# Patient Record
Sex: Male | Born: 1970 | Race: White | Hispanic: No | Marital: Married
Health system: Midwestern US, Community
[De-identification: ages and names within clinical notes are randomized; demographics above are authoritative.]

## PROBLEM LIST (undated history)

## (undated) ENCOUNTER — Ambulatory Visit

## (undated) DIAGNOSIS — R0602 Shortness of breath: Secondary | ICD-10-CM

## (undated) DIAGNOSIS — Z139 Encounter for screening, unspecified: Secondary | ICD-10-CM

## (undated) DIAGNOSIS — M25819 Other specified joint disorders, unspecified shoulder: Secondary | ICD-10-CM

## (undated) DIAGNOSIS — K219 Gastro-esophageal reflux disease without esophagitis: Principal | ICD-10-CM

## (undated) DIAGNOSIS — D7219 Other eosinophilia: Principal | ICD-10-CM

## (undated) DIAGNOSIS — I2694 Multiple subsegmental pulmonary emboli without acute cor pulmonale: Principal | ICD-10-CM

## (undated) DIAGNOSIS — I2699 Other pulmonary embolism without acute cor pulmonale: Principal | ICD-10-CM

## (undated) DIAGNOSIS — R936 Abnormal findings on diagnostic imaging of limbs: Secondary | ICD-10-CM

## (undated) DIAGNOSIS — I2693 Single subsegmental pulmonary embolism without acute cor pulmonale: Principal | ICD-10-CM

## (undated) DIAGNOSIS — J4540 Moderate persistent asthma, uncomplicated: Secondary | ICD-10-CM

## (undated) DIAGNOSIS — Z125 Encounter for screening for malignant neoplasm of prostate: Secondary | ICD-10-CM

## (undated) DIAGNOSIS — J449 Chronic obstructive pulmonary disease, unspecified: Principal | ICD-10-CM

## (undated) DIAGNOSIS — F17210 Nicotine dependence, cigarettes, uncomplicated: Secondary | ICD-10-CM

## (undated) DIAGNOSIS — W57XXXA Bitten or stung by nonvenomous insect and other nonvenomous arthropods, initial encounter: Secondary | ICD-10-CM

## (undated) DIAGNOSIS — Z79899 Other long term (current) drug therapy: Principal | ICD-10-CM

## (undated) DIAGNOSIS — S30861A Insect bite (nonvenomous) of abdominal wall, initial encounter: Secondary | ICD-10-CM

## (undated) DIAGNOSIS — C3490 Malignant neoplasm of unspecified part of unspecified bronchus or lung: Secondary | ICD-10-CM

## (undated) DIAGNOSIS — I639 Cerebral infarction, unspecified: Secondary | ICD-10-CM

## (undated) DIAGNOSIS — K760 Fatty (change of) liver, not elsewhere classified: Secondary | ICD-10-CM

## (undated) DIAGNOSIS — I700 Atherosclerosis of aorta: Secondary | ICD-10-CM

## (undated) DIAGNOSIS — J439 Emphysema, unspecified: Secondary | ICD-10-CM

## (undated) DIAGNOSIS — C6290 Malignant neoplasm of unspecified testis, unspecified whether descended or undescended: Secondary | ICD-10-CM

## (undated) HISTORY — DX: Fatty (change of) liver, not elsewhere classified: K76.0

## (undated) HISTORY — DX: Emphysema, unspecified: J43.9

## (undated) HISTORY — DX: Atherosclerosis of aorta: I70.0

## (undated) HISTORY — PX: CARDIAC CATHETERIZATION: SHX172

## (undated) HISTORY — PX: TESTICLE SURGERY: SHX794

---

## 1998-07-27 ENCOUNTER — Emergency Department (HOSPITAL_COMMUNITY): Admission: EM | Admit: 1998-07-27 | Discharge: 1998-07-27 | Payer: Self-pay | Admitting: Emergency Medicine

## 1998-12-29 ENCOUNTER — Encounter: Payer: Self-pay | Admitting: Emergency Medicine

## 1998-12-29 ENCOUNTER — Emergency Department (HOSPITAL_COMMUNITY): Admission: EM | Admit: 1998-12-29 | Discharge: 1998-12-29 | Payer: Self-pay | Admitting: Emergency Medicine

## 1999-02-17 ENCOUNTER — Emergency Department (HOSPITAL_COMMUNITY): Admission: EM | Admit: 1999-02-17 | Discharge: 1999-02-17 | Payer: Self-pay | Admitting: Internal Medicine

## 2001-07-29 ENCOUNTER — Encounter: Payer: Self-pay | Admitting: Urology

## 2001-07-29 ENCOUNTER — Ambulatory Visit (HOSPITAL_COMMUNITY): Admission: RE | Admit: 2001-07-29 | Discharge: 2001-07-29 | Payer: Self-pay | Admitting: Urology

## 2001-08-01 ENCOUNTER — Ambulatory Visit (HOSPITAL_COMMUNITY): Admission: RE | Admit: 2001-08-01 | Discharge: 2001-08-01 | Payer: Self-pay | Admitting: Urology

## 2001-08-19 ENCOUNTER — Ambulatory Visit: Admission: RE | Admit: 2001-08-19 | Discharge: 2001-11-17 | Payer: Self-pay | Admitting: Radiation Oncology

## 2002-06-29 ENCOUNTER — Ambulatory Visit: Admission: RE | Admit: 2002-06-29 | Discharge: 2002-06-29 | Payer: Self-pay | Admitting: Radiation Oncology

## 2002-06-29 ENCOUNTER — Ambulatory Visit (HOSPITAL_COMMUNITY): Admission: RE | Admit: 2002-06-29 | Discharge: 2002-06-29 | Payer: Self-pay | Admitting: Radiation Oncology

## 2002-07-11 ENCOUNTER — Ambulatory Visit (HOSPITAL_COMMUNITY): Admission: RE | Admit: 2002-07-11 | Discharge: 2002-07-11 | Payer: Self-pay | Admitting: Radiation Oncology

## 2002-08-10 ENCOUNTER — Ambulatory Visit: Admission: RE | Admit: 2002-08-10 | Discharge: 2002-08-10 | Payer: Self-pay | Admitting: Radiation Oncology

## 2002-08-17 ENCOUNTER — Ambulatory Visit: Admission: RE | Admit: 2002-08-17 | Discharge: 2002-08-17 | Payer: Self-pay | Admitting: Radiation Oncology

## 2003-09-27 ENCOUNTER — Emergency Department (HOSPITAL_COMMUNITY): Admission: EM | Admit: 2003-09-27 | Discharge: 2003-09-27 | Payer: Self-pay | Admitting: Emergency Medicine

## 2012-11-24 ENCOUNTER — Emergency Department (HOSPITAL_COMMUNITY)
Admission: EM | Admit: 2012-11-24 | Discharge: 2012-11-24 | Disposition: A | Discharge status: Home / Self Care | Payer: BC Managed Care – PPO | Source: Home / Self Care

## 2012-11-24 ENCOUNTER — Encounter (HOSPITAL_COMMUNITY): Payer: Self-pay | Admitting: *Deleted

## 2012-11-24 DIAGNOSIS — J039 Acute tonsillitis, unspecified: Secondary | ICD-10-CM

## 2012-11-24 HISTORY — DX: Malignant neoplasm of unspecified testis, unspecified whether descended or undescended: C62.90

## 2012-11-24 MED ORDER — ACETAMINOPHEN-CODEINE 120-12 MG/5ML PO SOLN: 5.0000 mL | Freq: Four times a day (QID) | ORAL | Status: DC | PRN

## 2012-11-24 MED ORDER — IBUPROFEN 600 MG PO TABS: 600.0000 mg | ORAL_TABLET | Freq: Three times a day (TID) | ORAL | Status: DC | PRN

## 2012-11-24 MED ORDER — PREDNISONE 20 MG PO TABS: ORAL_TABLET | ORAL | Status: DC

## 2012-11-24 MED ORDER — AMOXICILLIN 500 MG PO CAPS: 500.0000 mg | ORAL_CAPSULE | Freq: Three times a day (TID) | ORAL | Status: DC

## 2012-11-24 NOTE — ED Notes (Signed)
Pt  Reports  Symptoms  Of  sorethroat  With  Swollen lymph  Nodes  For  sev  Days

## 2012-11-24 NOTE — ED Provider Notes (Signed)
History     CSN: [id number]  Arrival date & time 11/24/12  1030   None     No chief complaint on file.   (Consider location/radiation/quality/duration/timing/severity/associated sxs/prior treatment) HPI Comments: 42 year old male smoker here complaining of sore throat for one week. Patient reports painful swallowing and headache associated with sore throat. Taking frequently aspirin for pain. Symptoms not improving. Denies fever. Reports decreased appetite and intermittent nausea. Denies abdominal pain, vomiting or diarrhea. No rashes.   Past Medical History  Diagnosis Date  . Testicle cancer     Past Surgical History  Procedure Laterality Date  . Testicle surgery      No family history on file.  History  Substance Use Topics  . Smoking status: Current Every Day Smoker  . Smokeless tobacco: Not on file  . Alcohol Use: Yes      Review of Systems  Constitutional: Positive for appetite change. Negative for fever, chills and fatigue.  HENT: Positive for sore throat and trouble swallowing. Negative for congestion and sinus pressure.   Respiratory: Negative for cough, shortness of breath and wheezing.   Cardiovascular: Negative for chest pain.  Gastrointestinal: Positive for nausea. Negative for vomiting, abdominal pain and diarrhea.  Musculoskeletal: Negative for arthralgias.  Skin: Negative for rash.  Neurological: Positive for headaches. Negative for dizziness.    Allergies  Review of patient's allergies indicates no known allergies.  Home Medications   Current Outpatient Rx  Name  Route  Sig  Dispense  Refill  . sildenafil (VIAGRA) 100 MG tablet   Oral   Take 100 mg by mouth daily as needed for erectile dysfunction.         Marland Kitchen acetaminophen-codeine 120-12 MG/5ML solution   Oral   Take 5 mLs by mouth every 6 (six) hours as needed for pain.   120 mL   0   . amoxicillin (AMOXIL) 500 MG capsule   Oral   Take 1 capsule (500 mg total) by mouth 3 (three)  times daily.   30 capsule   0   . ibuprofen (ADVIL,MOTRIN) 600 MG tablet   Oral   Take 1 tablet (600 mg total) by mouth every 8 (eight) hours as needed for pain or fever.   20 tablet   0   . predniSONE (DELTASONE) 20 MG tablet      2 tabs by mouth daily for 3 days   6 tablet   0     BP 118/72  Pulse 72  Temp(Src) 98.6 F (37 C) (Oral)  Resp 16  SpO2 100%  Physical Exam  Nursing note and vitals reviewed. Constitutional: He is oriented to person, place, and time. He appears well-developed and well-nourished. No distress.  HENT:  Head: Normocephalic and atraumatic.  Left Ear: External ear normal.  Right external ear canal with wax plug. Nose normal. Significant pharyngeal erythema left tonsillar swelling with white exudates. No uvula deviation. No trismus. TM right:Right external ear canal with wax plug. TM not observed. Left TM normal.  Eyes: Conjunctivae and EOM are normal. Pupils are equal, round, and reactive to light. No scleral icterus.  Neck: Neck supple.   Enlarged tender left submandibular lymphadenopathy  Cardiovascular: Normal rate, regular rhythm and normal heart sounds.   No murmur heard. Pulmonary/Chest: Effort normal and breath sounds normal. No respiratory distress. He has no wheezes. He has no rales. He exhibits no tenderness.  Abdominal: Soft. There is no tenderness.  No hepatosplenomegaly  Neurological: He is alert and oriented to person,  place, and time.  Skin: No rash noted. He is not diaphoretic.    ED Course  Procedures (including critical care time)  Labs Reviewed - No data to display No results found.   1. Tonsillitis       MDM  Exudative left tonsil with enlarged tender left lymphadenopathy. Prescribed amoxicillin, ibuprofen, prednisone, acetaminophen/codeine. Supportive care and red flags that should prompt his return to medical attention discussed with patient and provided in writing        Sharin Grave, MD 11/24/12  1137

## 2013-01-03 ENCOUNTER — Telehealth: Payer: Self-pay | Admitting: *Deleted

## 2013-01-03 NOTE — Telephone Encounter (Signed)
On 01-03-13 fax the last office note to [facility] physicians at [place].

## 2013-10-05 LAB — URINALYSIS
Bilirubin Urine: NEGATIVE
Glucose, Ur: NEGATIVE MG/DL
Ketones, Urine: NEGATIVE MG/DL
Leukocyte Esterase, Urine: NEGATIVE
Nitrite, Urine: NEGATIVE
Occult Blood,Urine: NEGATIVE
Protein, UA: NEGATIVE MG/DL
Specific Gravity, Urine: 1.027 (ref 1.001–1.035)
Urobilinogen, Urine: 0.2 EU/DL
pH, UA: 6

## 2014-07-13 ENCOUNTER — Emergency Department: Payer: Self-pay | Admitting: Emergency Medicine

## 2016-03-11 ENCOUNTER — Emergency Department
Admission: EM | Admit: 2016-03-11 | Discharge: 2016-03-11 | Disposition: A | Discharge status: Home / Self Care | Payer: Self-pay | Attending: Student | Admitting: Student

## 2016-03-11 DIAGNOSIS — Z8547 Personal history of malignant neoplasm of testis: Secondary | ICD-10-CM | POA: Insufficient documentation

## 2016-03-11 DIAGNOSIS — F17200 Nicotine dependence, unspecified, uncomplicated: Secondary | ICD-10-CM | POA: Insufficient documentation

## 2016-03-11 DIAGNOSIS — K610 Anal abscess: Secondary | ICD-10-CM

## 2016-03-11 DIAGNOSIS — K611 Rectal abscess: Secondary | ICD-10-CM | POA: Insufficient documentation

## 2016-03-11 MED ORDER — OXYCODONE-ACETAMINOPHEN 5-325 MG PO TABS: 1.0000 | ORAL_TABLET | ORAL | 0 refills | Status: DC | PRN

## 2016-03-11 MED ORDER — SULFAMETHOXAZOLE-TRIMETHOPRIM 800-160 MG PO TABS: 1.0000 | ORAL_TABLET | Freq: Two times a day (BID) | ORAL | 0 refills | Status: DC

## 2016-03-11 NOTE — Discharge Instructions (Signed)
Begin taking medication today. Did not take pain medication while driving or operating machinery. Take antibiotics twice day for 10 days. Do sitz baths 2-3 times per day or warm moist compresses frequently. Follow-up with Dr. Pat Patrick who is the surgeon on call. He will need to call his office and make an appointment. Also return to the emergency room over the weekend if any worsening of your symptoms.

## 2016-03-11 NOTE — ED Triage Notes (Signed)
Pt c/o swelling around anal area for the past 3 days.Marland Kitchen

## 2016-03-11 NOTE — ED Notes (Signed)
See triage note   States he has had peri rectal abscess in past  Developed pain with some swelling about 1 week ago  Also noticed some blood on paper when using the bathroom

## 2016-03-11 NOTE — ED Provider Notes (Signed)
[facility] Emergency Department Provider Note  ____________________________________________   First MD Initiated Contact with Patient 03/11/16 0848     (approximate)  I have reviewed the triage vital signs and the nursing notes.   HISTORY  Chief Complaint Abscess   HPI Jacob [name] is a 45 y.o. male is here with complaint of swelling in the rectal area for the last 3 days. Patient states that he had similar episode approximately 10 years ago at which time it was an abscess and it had to be opened and drained. He has had no problems since that time. Prior to arrival in the emergency room patient has not done any sitz baths or warm compresses to the area. He denies any fever or chills, no nausea or vomiting. He rates his pain as a 3 out of 10.   Past Medical History:  Diagnosis Date  . 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[place])     There are no active problems to display for this patient.   Past Surgical History:  Procedure Laterality Date  . TESTICLE SURGERY      Prior to Admission medications   Medication Sig Start Date End Date Taking? Authorizing Provider  oxyCODONE-acetaminophen (PERCOCET) 5-325 MG tablet Take 1-2 tablets by mouth every 4 (four) hours as needed for severe pain. 03/11/16   Johnn Hai, PA-C  sulfamethoxazole-trimethoprim (BACTRIM DS,SEPTRA DS) 800-160 MG tablet Take 1 tablet by mouth 2 (two) times daily. 03/11/16   Johnn Hai, PA-C    Allergies Review of patient's allergies indicates no known allergies.  No family history on file.  Social History Social History  Substance Use Topics  . Smoking status: Current Every Day Smoker  . Smokeless tobacco: Never Used  . Alcohol use No    Review of Systems Constitutional: No fever/chills Cardiovascular: Denies chest pain. Respiratory: Denies shortness of breath. Gastrointestinal: No abdominal pain.  No nausea, no vomiting.   Musculoskeletal: Negative for back pain. Skin:  Negative for rash.  Positive abscess. Neurological: Negative for headaches, focal weakness or numbness.  10-point ROS otherwise negative.  ____________________________________________   PHYSICAL EXAM:  VITAL SIGNS: ED Triage Vitals  Enc Vitals Group     BP 03/11/16 0839 (!) 157/87     Pulse Rate 03/11/16 0839 97     Resp 03/11/16 0839 18     Temp 03/11/16 0839 98.2 F (36.8 C)     Temp Source 03/11/16 0839 Oral     SpO2 03/11/16 0839 98 %     Weight 03/11/16 0840 170 lb (77.1 kg)     Height 03/11/16 0840 '5\' 8"'$  (1.727 m)     Head Circumference --      Peak Flow --      Pain Score 03/11/16 0840 3     Pain Loc --      Pain Edu? --      Excl. in [place]? --     Constitutional: Alert and oriented. Well appearing and in no acute distress. Eyes: Conjunctivae are normal. PERRL. EOMI. Head: Atraumatic. Nose: No congestion/rhinnorhea. Neck: No stridor.   Cardiovascular: Normal rate, regular rhythm. Grossly normal heart sounds.  Good peripheral circulation. Respiratory: Normal respiratory effort.  No retractions. Lungs CTAB. Musculoskeletal: Moves upper and lower extremities without any difficulty. Normal gait was noted. Neurologic:  Normal speech and language. No gross focal neurologic deficits are appreciated. No gait instability. Skin:  Skin is warm, dry.  Firm, tender, Nonfluctuant erythematous nodule measuring approximately 2 cm perirectal area. No  drainage and no extending cellulitis. Psychiatric: Mood and affect are normal. Speech and behavior are normal.  ____________________________________________   LABS (all labs ordered are listed, but only abnormal results are displayed)  Labs Reviewed - No data to display   PROCEDURES  Procedure(s) performed: None  Procedures  Critical Care performed: No  ____________________________________________   INITIAL IMPRESSION / ASSESSMENT AND PLAN / ED COURSE  Pertinent labs & imaging results that were available during my care  of the patient were reviewed by me and considered in my medical decision making (see chart for details).    Clinical Course   She is given prescription for Percocet one or 2 every 4 hours as needed for pain. He is also given a prescription for Bactrim DS twice a day for 10 days. Patient is encouraged to do sitz baths and warm compresses frequently for the next 1-2 days and to return to the emergency room for incision and drainage. He was also given information about the surgeon who is on call to see in the office if possible. Patient will return to the emergency room if any nausea, vomiting or fever.  ____________________________________________   FINAL CLINICAL IMPRESSION(S) / ED DIAGNOSES  Final diagnoses:  Perianal abscess      NEW MEDICATIONS STARTED DURING THIS VISIT:  Discharge Medication List as of 03/11/2016  9:01 AM    START taking these medications   Details  oxyCODONE-acetaminophen (PERCOCET) 5-325 MG tablet Take 1-2 tablets by mouth every 4 (four) hours as needed for severe pain., Starting Wed 03/11/2016, Print    sulfamethoxazole-trimethoprim (BACTRIM DS,SEPTRA DS) 800-160 MG tablet Take 1 tablet by mouth 2 (two) times daily., Starting Wed 03/11/2016, Print         Note:  This document was prepared using Dragon voice recognition software and may include unintentional dictation errors.    Johnn Hai, PA-C 03/11/16 [place] Lyndall Bellot, PA-C 03/21/16 1814    Joanne Gavel, MD 03/23/16 [phone]

## 2017-09-21 NOTE — ED Provider Notes (Signed)
History        Chief Complaint   Patient presents with   . Flank Pain       47 year old male presents with left flank pain x2 weeks.  Describes aching/cramping pain in left flank without radiation, no aggravating or alleviating factors.  Pain is intermittent but worsening.  No known injury or trauma.  Was seen by PCP x2 over the past two weeks, treated with NSAIDs, muscle relaxers, Medrol Dosepack, and tramadol without improvement in symptoms.  Had acute worsening today and was told to come to the ED for further evaluation.  Scheduled for outpatient MRI by PCP next week if not improving.  Denies saddle anesthesia, loss of bowel or bladder control, or leg weakness/numbness.  Denies fevers or symptoms of systemic illness.  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      The history is provided by the patient.   Flank Pain   Severity:  Moderate  Onset quality:  Gradual  Duration:  2 weeks  Timing:  Intermittent  Progression:  Worsening  Chronicity:  New  Relieved by:  Nothing  Worsened by:  Nothing  Ineffective treatments:  See HPI  Associated symptoms: no abdominal pain, no chest pain, no cough, no fever, no nausea, no rash, no shortness of breath and no vomiting        History reviewed. No pertinent past medical history.    Past Surgical History:   Procedure Laterality Date   . NEPHRECTOMY TRANSPLANTED ORGAN Left        History reviewed. No pertinent family history.    Social History     Tobacco Use   . Smoking status: Current Every Day Smoker   . Smokeless tobacco: Never Used   Substance and Sexual Activity   . Alcohol use: No     Frequency: Never   . Drug use: No   . Sexual activity: Defer       Prior to Admission medications    Medication Sig Start Date End Date Taking? Authorizing Provider   ranitidine (ZANTAC) 150 MG tablet Take 150 mg by mouth 2 (two) times a day   Yes Historical Provider, MD       Review of Systems   Constitutional: Negative for fever.   Respiratory: Negative for cough and shortness of  breath.    Cardiovascular: Negative for chest pain.   Gastrointestinal: Negative for abdominal pain, nausea and vomiting.   Genitourinary: Positive for flank pain.   Musculoskeletal: Positive for back pain.   Skin: Negative for rash.   All other systems reviewed and are negative.        Physical Exam   Initial Vitals:  ED Triage Vitals [09/21/17 1047]  BP: 116/73  Pulse: 68  Resp: 16  Temp: 98 F (36.7 C)  Temp src: Oral  SpO2: 98 %    Vitals:  BP 116/73   Pulse 68   Temp 98 F (36.7 C) (Oral)   Resp 16   Ht 6\' 1"  (1.854 m)   Wt 93 kg (205 lb)   SpO2 98%   BMI 27.05 kg/m     Physical Exam   Constitutional: He is oriented to person, place, and time. He appears well-developed and well-nourished. No distress.   HENT:   Head: Normocephalic and atraumatic.   Cardiovascular: Normal rate and regular rhythm.   Pulmonary/Chest: Effort normal and breath sounds normal. No respiratory distress.   Abdominal: Soft. There is no tenderness.   Musculoskeletal: Normal range of motion.  TTP over left flank without midline tenderness.  No overlying erythema, ecchymosis, or crepitus.   Neurological: He is alert and oriented to person, place, and time.   Skin: Skin is warm and dry.   Nursing note and vitals reviewed.      ED Course         Procedures        MDM  Number of Diagnoses or Management Options  Acute left-sided low back pain without sciatica:      Amount and/or Complexity of Data Reviewed  Clinical lab tests: reviewed and ordered  Tests in the radiology section of CPT: ordered and reviewed        - labs reviewed, no significant findings  - CTAP with no acute findings  - will treat for MSK pain with naproxen, Robaxin, and small amount of Norco; d/c with PCP f/u  - PMP reviewed, only recent narcotic Rx was #7 tramadol  - discussed results, treatment plan, and return precautions with patient, all questions answered, patient voiced understanding and agreement with plan         ED Orders (From admission, onward)     Ordered     Status Ordering Provider    09/21/17 1211  CT Abdomen Pelvis Without Contrast  1 time imaging      Final result DUNCAN, MELISSA H    09/21/17 1211  sodium chloride (NS) 0.9 % bolus 1,000 mL  Once     Comments:  Wide Open Rate    Last MAR action:  Stopped Para March, MELISSA H    09/21/17 1128  Extra Tube Red Top  PROCEDURE ONCE      Final result DUNCAN, MELISSA H    09/21/17 1128  Extra Tube Pink Top  PROCEDURE ONCE      In process DUNCAN, MELISSA H    09/21/17 1128  Extra Tube Light Blue Top  PROCEDURE ONCE      In process DUNCAN, MELISSA H    09/21/17 1128  Extra Tube SST  PROCEDURE ONCE      In process DUNCAN, MELISSA H    09/21/17 1128  Rainbow draw  STAT      In process DUNCAN, MELISSA H    09/21/17 1110  CBC with Differential  STAT      Final result HAMBY, DAVID E    09/21/17 1110  Comprehensive Metabolic Panel  STAT      Final result HAMBY, DAVID E    09/21/17 1110  UA with Microscopic and Culture if Positive  STAT      Final result HAMBY, DAVID E            Lab Results   CBC WITH DIFFERENTIAL - Abnormal       Result Value    WBC 7.2      RBC 5.49      Hemoglobin 16.7      Hematocrit 49.5      MCV 90.3      MCH 30.4      MCHC 33.7      RDW CV 14.0      Platelet 296      MPV 7.4      Absolute Neutrophil 4.3      Absolute Lymphocyte 2.0      Absolute Monocyte 0.70 (*)     Absolute Eosinophil 0.20      Absolute Basophil 0.10      Neutrophil percent 59.2      Lymphocyte percent 27.9  Monocyte percent 9.6 (*)     Eosinophil percent 2.4      Basophil percent 0.9     COMPREHENSIVE METABOLIC PANEL - Abnormal    Sodium 139      Potassium 4.1      Chloride 103      Carbon dioxide 26      Anion gap 10      Glucose 105      BUN 16      Creatinine 1.20      BUN/Creatinine Ratio 13.3      Calcium 8.9      Protein total 7.0      Albumin 4.6      Bilirubin Total 0.3      AST 18      ALT 17 (*)     ALP 57      eGFR non-African American >60.0      eGFR African American >60.0     UA W MICROSCOPIC AND CULTURE IF POS     Color, UA Yellow      Appearance, UA Clear      Glucose, UA Negative      Bilirubin, UA Negative      Ketones, UA Negative      Specific gravity, UA 1.015      Blood, UA Negative      pH, UA 6.0      Protein, UA Negative      Urobilinogen, UA 0.2       Nitrite, UA Negative      Leukocytes, UA Negative      WBC, UA 1-2      RBC, UA 1-2     EXTRA TUBE RED TOP    Narrative:     Extra tube collected.   RAINBOW DRAW    Narrative:     The following orders were created for panel order Rainbow draw.  Procedure                               Abnormality         Status                     ---------                               -----------         ------                     Extra Terrence Dupont NGE[[id number]]                        In process                 Extra Tube MWN[[id number]]                                   In process                 Extra Tube Red QIH[[id number]]                               Final result  Extra Tube Pink [id number]                              In process                   Please view results for these tests on the individual orders.   EXTRA TUBE LIGHT BLUE TOP   EXTRA TUBE SST   EXTRA TUBE PINK TOP       CT Abdomen Pelvis Without Contrast   Final Result   REFERRED BY:  Cecille Po      DICTATED BY:  Dr. Jamse Belfast      PROCEDURE:  CT ABDOMEN PELVIS WO CONTRAST   Order Date:  09/21/2017      REASON FOR EXAM:  left flank pain      FINDINGS:  Axial images are obtained through the abdomen and pelvis   without IV contrast.  The liver demonstrates a small cyst in the   right lobe measuring 1.5 cm.  The right kidney demonstrates no   evidence of a stone.  Left kidney has been removed.  Aorta is normal   caliber.  Pancreas and spleen appear unremarkable.  No adrenal nodule   is identified.  The appendix is unremarkable.  The bladder is   unremarkable.  No pelvic fluid collection is seen.  There are   scattered diverticula sigmoid colon.  The lung bases demonstrate a   tiny nodule  in the right middle lobe measuring 3 mm.  There is a tiny   nodule in the right lower lobe measuring 2 mm.         IMPRESSION:  Previous left nephrectomy   Tiny benign-appearing nodules noted in the right lung most likely   granulomas.  Follow-up recommended to evaluate for stability   There are few scattered diverticula in the left and sigmoid colon.              ED Medication Administration from 09/21/2017 1042 to 09/21/2017 1858       Date/Time Order Dose Route Action Action by     09/21/2017 1449 sodium chloride (NS) 0.9 % bolus 1,000 mL 0 mL Intravenous Stopped Serita Kyle, RN     09/21/2017 1224 sodium chloride (NS) 0.9 % bolus 1,000 mL 1,000 mL Intravenous New Bag Serita Kyle, RN            Follow-up Provider     Follow up With Specialties Details Why Contact Info    Primary Care Provider         Last Edited:  Tue Sep 21, 2017  2:24 PM by Cecille Po, MD         ED Prescriptions (From admission, onward)    Start Ordered     Status Ordering Provider    09/21/17 0000 09/21/17 1430  HYDROcodone-acetaminophen (NORCO) 5-325 mg tablet  Every 6 hours PRN      Ordered DUNCAN, MELISSA H    09/21/17 0000 09/21/17 1430  methocarbamol (ROBAXIN) 750 MG tablet  Every 8 hours PRN      Ordered DUNCAN, MELISSA H    09/21/17 0000 09/21/17 1430  naproxen (NAPROSYN) 500 MG tablet  2 times daily PRN      Ordered DUNCAN, MELISSA H                Discharge Instructions       You were seen in the ED for  back pain.  Your workup did not show an emergent cause for your symptoms that requires hospitalization.  Your symptoms are likely due to a pinched nerve in your spine.  Please refer to the attached handout for more information.    Take medications as directed.  Follow up with PCP to discuss the need for an MRI for further evaluation.  Return to ED if your symptoms worsen, or if you develop any new and concerning symptoms.    The examination and treatment you have received in the Emergency Department today have been rendered on  an emergency basis only and are not intended to be a substitute for an effort to provide complete medical care.  You should contact your follow-up physician as it is important that you let him or her check you and report any new or remaining problems since it is impossible to recognize and treat all elements of an injury or illness in a single emergency care center visit.            Clinical Impression  Final diagnoses:   [M54.5] Acute left-sided low back pain without sciatica       Critical Care Time (excluding time for other separate services)           Cecille Po, MD  Electronically Signed  09/21/17 1858

## 2018-08-30 ENCOUNTER — Emergency Department (HOSPITAL_COMMUNITY): Payer: Self-pay

## 2018-08-30 ENCOUNTER — Encounter (HOSPITAL_COMMUNITY): Payer: Self-pay

## 2018-08-30 ENCOUNTER — Emergency Department (HOSPITAL_COMMUNITY)
Admission: EM | Admit: 2018-08-30 | Discharge: 2018-08-30 | Disposition: A | Discharge status: Home / Self Care | Payer: Self-pay | Attending: Emergency Medicine | Admitting: Emergency Medicine

## 2018-08-30 DIAGNOSIS — J189 Pneumonia, unspecified organism: Secondary | ICD-10-CM | POA: Insufficient documentation

## 2018-08-30 DIAGNOSIS — F17200 Nicotine dependence, unspecified, uncomplicated: Secondary | ICD-10-CM | POA: Insufficient documentation

## 2018-08-30 DIAGNOSIS — Z8547 Personal history of malignant neoplasm of testis: Secondary | ICD-10-CM | POA: Insufficient documentation

## 2018-08-30 LAB — BASIC METABOLIC PANEL
ANION GAP: 8 (ref 5–15)
BUN: 14 mg/dL (ref 6–20)
CALCIUM: 9.4 mg/dL (ref 8.9–10.3)
CO2: 24 mmol/L (ref 22–32)
Chloride: 105 mmol/L (ref 98–111)
Creatinine, Ser: 0.69 mg/dL (ref 0.61–1.24)
GFR calc Af Amer: >60 mL/min (ref >60)
GFR calc non Af Amer: >60 mL/min (ref >60)
Glucose, Bld: 102 mg/dL — ABNORMAL HIGH (ref 70–99)
Potassium: 3.9 mmol/L (ref 3.5–5.1)
Sodium: 137 mmol/L (ref 135–145)

## 2018-08-30 LAB — CBC
HCT: 46.4 % (ref 39.0–52.0)
HEMOGLOBIN: 14.9 g/dL (ref 13.0–17.0)
MCH: 29.7 pg (ref 26.0–34.0)
MCHC: 32.1 g/dL (ref 30.0–36.0)
MCV: 92.4 fL (ref 80.0–100.0)
PLATELETS: 298 10*3/uL (ref 150–400)
RBC: 5.02 MIL/uL (ref 4.22–5.81)
RDW: 13.8 % (ref 11.5–15.5)
WBC: 15.1 10*3/uL — ABNORMAL HIGH (ref 4.0–10.5)
nRBC: 0 % (ref 0.0–0.2)

## 2018-08-30 LAB — POCT I-STAT TROPONIN I: Troponin i, poc: 0 ng/mL (ref 0.00–0.08)

## 2018-08-30 MED ORDER — DOXYCYCLINE HYCLATE 100 MG PO CAPS: 100.0000 mg | ORAL_CAPSULE | Freq: Two times a day (BID) | ORAL | 0 refills | Status: AC

## 2018-08-30 MED ORDER — ALBUTEROL SULFATE (2.5 MG/3ML) 0.083% IN NEBU
5.0000 mg | INHALATION_SOLUTION | Freq: Once | RESPIRATORY_TRACT | Status: AC
  Administered 2018-08-30: 5 mg via RESPIRATORY_TRACT
  Filled 2018-08-30: qty 6

## 2018-08-30 NOTE — ED Triage Notes (Signed)
Pt stating he has had shortness of breath since Sunday night, causing chest pain. States it felt like he pulled a muscle but it continues to get worse. Pt states last week he had a productive cough but now feels like he cant get anything up.

## 2018-08-30 NOTE — ED Provider Notes (Signed)
[facility] DEPT Provider Note   CSN: [id number] Arrival date & time: 08/30/18  1937     History   Chief Complaint Chief Complaint  Patient presents with  . Chest Pain  . Shortness of Breath    HPI Jacob [name] is a 48 y.o. male.  The history is provided by the patient.  Cough  Cough characteristics:  Productive Sputum characteristics:  Nondescript Severity:  Mild Onset quality:  Gradual Timing:  Constant Progression:  Unchanged Chronicity:  New Context: upper respiratory infection   Relieved by:  Nothing Worsened by:  Nothing Associated symptoms: chest pain, chills, fever, myalgias, shortness of breath and sinus congestion   Associated symptoms: no ear pain, no rash, no sore throat and no wheezing   Risk factors: recent infection     Past Medical History:  Diagnosis Date  . 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   Past Surgical History:  Procedure Laterality Date  . TESTICLE SURGERY          Home Medications    Prior to Admission medications   Medication Sig Start Date End Date Taking? Authorizing Provider  doxycycline (VIBRAMYCIN) 100 MG capsule Take 1 capsule (100 mg total) by mouth 2 (two) times daily for 10 days. 08/30/18 09/09/18  Echo Allsbrook, DO  oxyCODONE-acetaminophen (PERCOCET) 5-325 MG tablet Take 1-2 tablets by mouth every 4 (four) hours as needed for severe pain. Patient not taking: Reported on 08/30/2018 03/11/16   Johnn Hai, PA-C  sulfamethoxazole-trimethoprim (BACTRIM DS,SEPTRA DS) 800-160 MG tablet Take 1 tablet by mouth 2 (two) times daily. Patient not taking: Reported on 08/30/2018 03/11/16   Johnn Hai, PA-C    Family History No family history on file.  Social History Social History   Tobacco Use  . Smoking status: Current Every Day Smoker  . Smokeless tobacco: Never Used  Substance Use Topics  . Alcohol use: No  . Drug use: Not on  file     Allergies   Patient has no known allergies.   Review of Systems Review of Systems  Constitutional: Positive for chills and fever.  HENT: Negative for ear pain and sore throat.   Eyes: Negative for pain and visual disturbance.  Respiratory: Positive for cough and shortness of breath. Negative for wheezing.   Cardiovascular: Positive for chest pain. Negative for palpitations.  Gastrointestinal: Negative for abdominal pain and vomiting.  Genitourinary: Negative for dysuria and hematuria.  Musculoskeletal: Positive for myalgias. Negative for arthralgias and back pain.  Skin: Negative for color change and rash.  Neurological: Negative for seizures and syncope.  All other systems reviewed and are negative.    Physical Exam Updated Vital Signs  ED Triage Vitals  Enc Vitals Group     BP 08/30/18 2004 (!) 157/106     Pulse Rate 08/30/18 2004 99     Resp 08/30/18 2004 (!) 22     Temp --      Temp src --      SpO2 08/30/18 2004 96 %     Weight 08/30/18 2004 160 lb (72.6 kg)     Height 08/30/18 2004 5\' 7"  (1.702 m)     Head Circumference --      Peak Flow --      Pain Score 08/30/18 1956 7     Pain Loc --      Pain Edu? --      Excl. in  [place]? --     Physical Exam Vitals signs and nursing note reviewed.  Constitutional:      Appearance: He is well-developed.  HENT:     Head: Normocephalic and atraumatic.  Eyes:     Conjunctiva/sclera: Conjunctivae normal.  Neck:     Musculoskeletal: Neck supple.  Cardiovascular:     Rate and Rhythm: Normal rate and regular rhythm.     Pulses:          Radial pulses are 2+ on the right side and 2+ on the left side.       Dorsalis pedis pulses are 2+ on the right side and 2+ on the left side.     Heart sounds: Normal heart sounds. No murmur.  Pulmonary:     Effort: Pulmonary effort is normal. No respiratory distress.     Breath sounds: Normal breath sounds. No decreased breath sounds, wheezing, rhonchi or rales.  Abdominal:      General: Bowel sounds are normal.     Palpations: Abdomen is soft.     Tenderness: There is no abdominal tenderness.  Musculoskeletal:     Right lower leg: No edema.     Left lower leg: No edema.  Skin:    General: Skin is warm and dry.     Capillary Refill: Capillary refill takes less than 2 seconds.  Neurological:     General: No focal deficit present.     Mental Status: He is alert.      ED Treatments / Results  Labs (all labs ordered are listed, but only abnormal results are displayed) Labs Reviewed  BASIC METABOLIC PANEL - Abnormal; Notable for the following components:      Result Value   Glucose, Bld 102 (*)    All other components within normal limits  CBC - Abnormal; Notable for the following components:   WBC 15.1 (*)    All other components within normal limits  I-STAT TROPONIN, ED  POCT I-STAT TROPONIN I    EKG EKG Interpretation  Date/Time:  Tuesday August 30 2018 19:56:03 EST Ventricular Rate:  103 PR Interval:    QRS Duration: 100 QT Interval:  332 QTC Calculation: 435 R Axis:   35 Text Interpretation:  Sinus tachycardia Probable left atrial enlargement Confirmed by Lennice Sites [phone]) on 08/30/2018 10:14:57 PM   Radiology Dg Chest 2 View  Result Date: 08/30/2018 CLINICAL DATA:  Shortness of breath, mid chest pain since this am, patient states he has been sick for a while but the shortness of breath began today EXAM: CHEST - 2 VIEW COMPARISON:  None. FINDINGS: Heart size is normal. There is patchy infiltrate within in the RIGHT LOWER lobe, partially obscuring of the RIGHT hemidiaphragm. B LEFT lung is clear. No pulmonary edema. IMPRESSION: RIGHT LOWER lobe infiltrate. Followup PA and lateral chest X-ray is recommended in 3-4 weeks following trial of antibiotic therapy to ensure resolution and exclude underlying malignancy. Electronically Signed   By: Nolon Nations M.D.   On: 08/30/2018 20:24    Procedures Procedures (including critical care  time)  Medications Ordered in ED Medications  albuterol (PROVENTIL) (2.5 MG/3ML) 0.083% nebulizer solution 5 mg (5 mg Nebulization Given 08/30/18 2002)     Initial Impression / Assessment and Plan / ED Course  I have reviewed the triage vital signs and the nursing notes.  Pertinent labs & imaging results that were available during my care of the patient were reviewed by me and considered in my medical decision making (see  chart for details).     Jacob [name] is a 48 year old male with history of testicular cancer who presents to the ED with cough, sputum production.  Patient with unremarkable vitals.  Patient states that he has had cough, sputum production over the last several days.  Has had upper respiratory symptoms for the last week plus.  Patient is a smoker.  Has overall clear breath sounds throughout.  Patient had EKG performed that showed sinus rhythm.  No signs of ischemic changes.  Patient had mild leukocytosis.  But otherwise no significant anemia, electrolyte abnormality, kidney injury.  Chest x-ray shows right lower lobe pneumonia.  Troponin was ordered prior to my evaluation and was unremarkable.  Doubt cardiac process.  Patient with history and physical that is consistent with pneumonia.  Will treat with doxycycline.  Patient breathing well on room air.  Given information to follow-up with primary care doctor and told to return to the ED if symptoms worsen.  Educated about stopping smoking.  This chart was dictated using voice recognition software.  Despite best efforts to proofread,  errors can occur which can change the documentation meaning.    Final Clinical Impressions(s) / ED Diagnoses   Final diagnoses:  Community acquired pneumonia, unspecified laterality    ED Discharge Orders         Ordered    doxycycline (VIBRAMYCIN) 100 MG capsule  2 times daily     08/30/18 2237           Lennice Sites, DO 08/30/18 2238

## 2019-01-28 ENCOUNTER — Encounter: Payer: Self-pay | Admitting: Emergency Medicine

## 2019-01-28 ENCOUNTER — Other Ambulatory Visit: Payer: Self-pay

## 2019-01-28 ENCOUNTER — Emergency Department
Admission: EM | Admit: 2019-01-28 | Discharge: 2019-01-28 | Disposition: A | Discharge status: Home / Self Care | Payer: Self-pay | Attending: Emergency Medicine | Admitting: Emergency Medicine

## 2019-01-28 ENCOUNTER — Emergency Department: Payer: Self-pay

## 2019-01-28 DIAGNOSIS — Z8547 Personal history of malignant neoplasm of testis: Secondary | ICD-10-CM | POA: Insufficient documentation

## 2019-01-28 DIAGNOSIS — F17200 Nicotine dependence, unspecified, uncomplicated: Secondary | ICD-10-CM | POA: Insufficient documentation

## 2019-01-28 DIAGNOSIS — J189 Pneumonia, unspecified organism: Secondary | ICD-10-CM | POA: Insufficient documentation

## 2019-01-28 MED ORDER — AZITHROMYCIN 500 MG PO TABS
500.0000 mg | ORAL_TABLET | Freq: Once | ORAL | Status: AC
  Administered 2019-01-28: 500 mg via ORAL
  Filled 2019-01-28: qty 1

## 2019-01-28 MED ORDER — AZITHROMYCIN 250 MG PO TABS: ORAL_TABLET | ORAL | 0 refills | Status: DC

## 2019-01-28 NOTE — ED Provider Notes (Signed)
[facility] Emergency Department Provider Note  ____________________________________________  Time seen: Approximately 12:57 PM  I have reviewed the triage vital signs and the nursing notes.   HISTORY  Chief Complaint Cough    HPI Jacob [name] is a 48 y.o. male that presents to the emergency department for evaluation of non productive cough and pain to right chest with coughing and deep breathing for 2 days with concerns of recurrent pneumonia.  Patient states that he had pneumonia in February and this feels exactly the same.  He took a course of doxycycline and symptoms resolved back in February.  No fever, nasal congestion, sore throat.  Past Medical History:  Diagnosis Date  . 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   Past Surgical History:  Procedure Laterality Date  . TESTICLE SURGERY      Prior to Admission medications   Medication Sig Start Date End Date Taking? Authorizing Provider  azithromycin (ZITHROMAX Z-PAK) 250 MG tablet Take 2 tablets (500 mg) on  Day 1,  followed by 1 tablet (250 mg) once daily on Days 2 through 5. 01/28/19   Laban Emperor, PA-C  oxyCODONE-acetaminophen (PERCOCET) 5-325 MG tablet Take 1-2 tablets by mouth every 4 (four) hours as needed for severe pain. Patient not taking: Reported on 08/30/2018 03/11/16   Johnn Hai, PA-C  sulfamethoxazole-trimethoprim (BACTRIM DS,SEPTRA DS) 800-160 MG tablet Take 1 tablet by mouth 2 (two) times daily. Patient not taking: Reported on 08/30/2018 03/11/16   Johnn Hai, PA-C    Allergies Patient has no known allergies.  No family history on file.  Social History Social History   Tobacco Use  . Smoking status: Current Every Day Smoker  . Smokeless tobacco: Never Used  Substance Use Topics  . Alcohol use: No  . Drug use: Not on file     Review of Systems  Constitutional: No fever/chills Eyes: No visual changes. No  discharge. ENT: Negative for congestion and rhinorrhea. Cardiovascular: Positive for chest pain with coughing and deep breathing. Respiratory: Positive for cough. No SOB. Gastrointestinal: No abdominal pain.  No nausea, no vomiting.  No diarrhea.  No constipation. Musculoskeletal: Negative for musculoskeletal pain. Skin: Negative for rash, abrasions, lacerations, ecchymosis. Neurological: Negative for headaches.   ____________________________________________   PHYSICAL EXAM:  VITAL SIGNS: ED Triage Vitals  Enc Vitals Group     BP 01/28/19 1106 137/84     Pulse Rate 01/28/19 1106 (!) 108     Resp 01/28/19 1106 20     Temp 01/28/19 1106 99 F (37.2 C)     Temp Source 01/28/19 1106 Oral     SpO2 01/28/19 1106 97 %     Weight 01/28/19 1109 165 lb (74.8 kg)     Height 01/28/19 1109 5\' 8"  (1.727 m)     Head Circumference --      Peak Flow --      Pain Score 01/28/19 1108 6     Pain Loc --      Pain Edu? --      Excl. in [place]? --      Constitutional: Alert and oriented. Well appearing and in no acute distress. Eyes: Conjunctivae are normal. PERRL. EOMI. No discharge. Head: Atraumatic. ENT: No frontal and maxillary sinus tenderness.      Ears: Tympanic membranes pearly gray with good landmarks. No discharge.      Nose: No congestion/rhinnorhea.      Mouth/Throat:  Mucous membranes are moist. Oropharynx non-erythematous. Tonsils not enlarged. No exudates. Uvula midline. Neck: No stridor.   Hematological/Lymphatic/Immunilogical: No cervical lymphadenopathy. Cardiovascular: Normal rate, regular rhythm.  Good peripheral circulation. Respiratory: Normal respiratory effort without tachypnea or retractions. Lungs CTAB. Good air entry to the bases with no decreased or absent breath sounds. Gastrointestinal: Bowel sounds 4 quadrants. Soft and nontender to palpation. No guarding or rigidity. No palpable masses. No distention. Musculoskeletal: Full range of motion to all extremities. No  gross deformities appreciated. Neurologic:  Normal speech and language. No gross focal neurologic deficits are appreciated.  Skin:  Skin is warm, dry and intact. No rash noted. Psychiatric: Mood and affect are normal. Speech and behavior are normal. Patient exhibits appropriate insight and judgement.   ____________________________________________   LABS (all labs ordered are listed, but only abnormal results are displayed)  Labs Reviewed - No data to display ____________________________________________  EKG  ST ____________________________________________  RADIOLOGY I, Laban Emperor, personally viewed and evaluated these images (plain radiographs) as part of my medical decision making, as well as reviewing the written report by the radiologist.  Dg Chest Portable 1 View  Result Date: 01/28/2019 CLINICAL DATA:  Cough and chest pain with inspiration beginning yesterday. History of prior pneumonia. EXAM: PORTABLE CHEST 1 VIEW COMPARISON:  08/30/2018 FINDINGS: Heart size is normal. Mediastinal shadows are normal. The left lung is clear. There is patchy density in the right lower lung, the area affected by pneumonia in February. This could represent residual scarring following the previous pneumonia, but the possibility of recurrent pneumonia does exist. No effusion. No bone abnormality. IMPRESSION: Abnormal patchy density in the right lower lung, the area affected by pneumonia in February. This could represent residual scarring or recurrent pneumonia. Electronically Signed   By: Nelson Chimes M.D.   On: 01/28/2019 11:41    ____________________________________________    PROCEDURES  Procedure(s) performed:    Procedures    Medications  azithromycin (ZITHROMAX) tablet 500 mg (500 mg Oral Given 01/28/19 1322)     ____________________________________________   INITIAL IMPRESSION / ASSESSMENT AND PLAN / ED COURSE  Pertinent labs & imaging results that were available during my  care of the patient were reviewed by me and considered in my medical decision making (see chart for details).  Review of the [place] CSRS was performed in accordance of the [place] prior to dispensing any controlled drugs.     Patient's diagnosis is consistent with pneumonia. Vital signs and exam are reassuring.  Chest x-ray concerning for recurrent pneumonia.  Patient is mildly tachycardic.  We discussed the possibility of a PE and patient declines any lab work to check a d-dimer today.  He states that this feels exactly like his pneumonia that he had 2 months ago and would like to begin antibiotics.  He will return to the emergency department if symptoms worsen.   Patient feels comfortable going home. Patient will be discharged home with prescriptions for azithromycin. Patient is to follow up with primary care as needed or otherwise directed. Patient is given ED precautions to return to the ED for any worsening or new symptoms.   Jacob [name] was evaluated in Emergency Department on 01/28/2019 for the symptoms described in the history of present illness. He was evaluated in the context of the global COVID-19 pandemic, which necessitated consideration that the patient might be at risk for infection with the SARS-CoV-2 virus that causes COVID-19. Institutional protocols and algorithms that pertain to the evaluation of patients at risk for  COVID-19 are in a state of rapid change based on information released by regulatory bodies including the CDC and federal and state organizations. These policies and algorithms were followed during the patient's care in the ED.  ____________________________________________  FINAL CLINICAL IMPRESSION(S) / ED DIAGNOSES  Final diagnoses:  Community acquired pneumonia of right lower lobe of lung ([place])      NEW MEDICATIONS STARTED DURING THIS VISIT:  ED Discharge Orders         Ordered    azithromycin (ZITHROMAX Z-PAK) 250 MG tablet  Status:  Discontinued      01/28/19 1332    azithromycin (ZITHROMAX Z-PAK) 250 MG tablet    Note to Pharmacy: pneumonia   01/28/19 1342              This chart was dictated using voice recognition software/Dragon. Despite best efforts to proofread, errors can occur which can change the meaning. Any change was purely unintentional.    Laban Emperor, PA-C 01/28/19 1556    Carrie Mew, MD 02/02/19 1352

## 2019-01-28 NOTE — ED Triage Notes (Signed)
Cough and pain with inspiration since yesterday.

## 2019-01-28 NOTE — Discharge Instructions (Signed)
Your chest x-ray is concerning for recurrent pneumonia as we discussed.  I gave you a dose of antibiotics in the emergency department.  You can begin your prescription antibiotics tomorrow.  Take Tylenol as needed for any fever.  Please follow-up with primary care on Monday.

## 2019-01-28 NOTE — ED Notes (Signed)
AAOx3.  Skin warm and dry.  NAD 

## 2019-01-28 NOTE — ED Provider Notes (Signed)
EKG is reviewed and interpreted by me at 1143 Heart rate 100 QRS 90 QTc 430 Sinus tachycardia, no evidence of ischemia   Delman Kitten, MD 01/28/19 1143

## 2020-10-25 ENCOUNTER — Other Ambulatory Visit (HOSPITAL_COMMUNITY): Payer: Self-pay | Admitting: Internal Medicine

## 2020-10-25 ENCOUNTER — Other Ambulatory Visit (HOSPITAL_COMMUNITY): Payer: Self-pay

## 2020-10-25 DIAGNOSIS — R609 Edema, unspecified: Secondary | ICD-10-CM

## 2020-10-31 ENCOUNTER — Ambulatory Visit (HOSPITAL_COMMUNITY)
Admission: RE | Admit: 2020-10-31 | Discharge: 2020-10-31 | Disposition: A | Discharge status: Home / Self Care | Payer: Medicaid Other | Source: Ambulatory Visit | Attending: Vascular Surgery | Admitting: Vascular Surgery

## 2020-10-31 ENCOUNTER — Other Ambulatory Visit: Payer: Self-pay

## 2020-10-31 ENCOUNTER — Ambulatory Visit (HOSPITAL_BASED_OUTPATIENT_CLINIC_OR_DEPARTMENT_OTHER)
Admission: RE | Admit: 2020-10-31 | Discharge: 2020-10-31 | Disposition: A | Discharge status: Home / Self Care | Payer: Medicaid Other | Source: Ambulatory Visit

## 2020-10-31 DIAGNOSIS — R609 Edema, unspecified: Secondary | ICD-10-CM

## 2020-10-31 NOTE — Progress Notes (Signed)
VASCULAR LAB    Bilateral upper and lower extremity venous duplex have been performed.  See CV proc for preliminary results.   Tranice Laduke, RVT 10/31/2020, 10:12 AM

## 2020-12-24 ENCOUNTER — Other Ambulatory Visit: Payer: Self-pay

## 2020-12-24 ENCOUNTER — Other Ambulatory Visit (HOSPITAL_BASED_OUTPATIENT_CLINIC_OR_DEPARTMENT_OTHER)
Admission: RE | Admit: 2020-12-24 | Discharge: 2020-12-24 | Disposition: A | Discharge status: Home / Self Care | Payer: Medicaid Other | Source: Ambulatory Visit | Attending: Internal Medicine | Admitting: Internal Medicine

## 2020-12-24 DIAGNOSIS — C342 Malignant neoplasm of middle lobe, bronchus or lung: Secondary | ICD-10-CM | POA: Insufficient documentation

## 2020-12-24 LAB — COMPREHENSIVE METABOLIC PANEL
ALT: 14 U/L (ref 0–44)
AST: 14 U/L — ABNORMAL LOW (ref 15–41)
Albumin: 3.7 g/dL (ref 3.5–5.0)
Alkaline Phosphatase: 101 U/L (ref 38–126)
Anion gap: 6 (ref 5–15)
BUN: 23 mg/dL — ABNORMAL HIGH (ref 6–20)
CO2: 24 mmol/L (ref 22–32)
Calcium: 8.4 mg/dL — ABNORMAL LOW (ref 8.9–10.3)
Chloride: 108 mmol/L (ref 98–111)
Creatinine, Ser: 1.34 mg/dL — ABNORMAL HIGH (ref 0.61–1.24)
GFR, Estimated: >60 mL/min (ref >60)
Glucose, Bld: 107 mg/dL — ABNORMAL HIGH (ref 70–99)
Potassium: 4.8 mmol/L (ref 3.5–5.1)
Sodium: 138 mmol/L (ref 135–145)
Total Bilirubin: 0.3 mg/dL (ref 0.3–1.2)
Total Protein: 5.9 g/dL — ABNORMAL LOW (ref 6.5–8.1)

## 2020-12-24 LAB — CBC
HCT: 45.3 % (ref 39.0–52.0)
Hemoglobin: 14.5 g/dL (ref 13.0–17.0)
MCH: 29.1 pg (ref 26.0–34.0)
MCHC: 32 g/dL (ref 30.0–36.0)
MCV: 91 fL (ref 80.0–100.0)
Platelets: 262 10*3/uL (ref 150–400)
RBC: 4.98 MIL/uL (ref 4.22–5.81)
RDW: 14.7 % (ref 11.5–15.5)
WBC: 8.4 10*3/uL (ref 4.0–10.5)
nRBC: 0 % (ref 0.0–0.2)

## 2020-12-24 LAB — TSH: TSH: 29.888 u[IU]/mL — ABNORMAL HIGH (ref 0.350–4.500)

## 2021-02-23 ENCOUNTER — Other Ambulatory Visit: Payer: Self-pay

## 2021-02-23 ENCOUNTER — Emergency Department (HOSPITAL_COMMUNITY)
Admission: EM | Admit: 2021-02-23 | Discharge: 2021-02-23 | Discharge status: Absent without leave | Payer: Medicaid Other

## 2021-02-25 ENCOUNTER — Emergency Department (HOSPITAL_BASED_OUTPATIENT_CLINIC_OR_DEPARTMENT_OTHER): Payer: Medicaid Other

## 2021-02-25 ENCOUNTER — Encounter (HOSPITAL_BASED_OUTPATIENT_CLINIC_OR_DEPARTMENT_OTHER): Payer: Self-pay | Admitting: *Deleted

## 2021-02-25 ENCOUNTER — Emergency Department (HOSPITAL_BASED_OUTPATIENT_CLINIC_OR_DEPARTMENT_OTHER)
Admission: EM | Admit: 2021-02-25 | Discharge: 2021-02-25 | Disposition: A | Discharge status: Home / Self Care | Payer: Medicaid Other | Attending: Emergency Medicine | Admitting: Emergency Medicine

## 2021-02-25 ENCOUNTER — Other Ambulatory Visit: Payer: Self-pay

## 2021-02-25 DIAGNOSIS — Z85118 Personal history of other malignant neoplasm of bronchus and lung: Secondary | ICD-10-CM | POA: Insufficient documentation

## 2021-02-25 DIAGNOSIS — U071 COVID-19: Secondary | ICD-10-CM | POA: Insufficient documentation

## 2021-02-25 DIAGNOSIS — Z8547 Personal history of malignant neoplasm of testis: Secondary | ICD-10-CM | POA: Diagnosis not present

## 2021-02-25 DIAGNOSIS — F17200 Nicotine dependence, unspecified, uncomplicated: Secondary | ICD-10-CM | POA: Diagnosis not present

## 2021-02-25 DIAGNOSIS — H9203 Otalgia, bilateral: Secondary | ICD-10-CM | POA: Diagnosis not present

## 2021-02-25 DIAGNOSIS — R110 Nausea: Secondary | ICD-10-CM | POA: Diagnosis present

## 2021-02-25 HISTORY — DX: Cerebral infarction, unspecified: I63.9

## 2021-02-25 HISTORY — DX: Malignant neoplasm of unspecified part of unspecified bronchus or lung: C34.90

## 2021-02-25 LAB — BASIC METABOLIC PANEL
Anion gap: 7 (ref 5–15)
BUN: 15 mg/dL (ref 6–20)
CO2: 27 mmol/L (ref 22–32)
Calcium: 8.4 mg/dL — ABNORMAL LOW (ref 8.9–10.3)
Chloride: 103 mmol/L (ref 98–111)
Creatinine, Ser: 1.05 mg/dL (ref 0.61–1.24)
GFR, Estimated: >60 mL/min (ref >60)
Glucose, Bld: 84 mg/dL (ref 70–99)
Potassium: 4.4 mmol/L (ref 3.5–5.1)
Sodium: 137 mmol/L (ref 135–145)

## 2021-02-25 LAB — CBC
HCT: 43.9 % (ref 39.0–52.0)
Hemoglobin: 14.3 g/dL (ref 13.0–17.0)
MCH: 28.9 pg (ref 26.0–34.0)
MCHC: 32.6 g/dL (ref 30.0–36.0)
MCV: 88.7 fL (ref 80.0–100.0)
Platelets: 188 10*3/uL (ref 150–400)
RBC: 4.95 MIL/uL (ref 4.22–5.81)
RDW: 14.9 % (ref 11.5–15.5)
WBC: 5.1 10*3/uL (ref 4.0–10.5)
nRBC: 0 % (ref 0.0–0.2)

## 2021-02-25 LAB — CBG MONITORING, ED: Glucose-Capillary: 75 mg/dL (ref 70–99)

## 2021-02-25 NOTE — ED Notes (Signed)
CBG 75.

## 2021-02-25 NOTE — ED Provider Notes (Signed)
[facility] EMERGENCY DEPT Provider Note   CSN: [id number] Arrival date & time: 02/25/21  1126     History Chief Complaint  Patient presents with   Covid Positive   Loss of hearing   Dizziness   Nausea    Jacob [name] is a 50 y.o. male.  Jacob [name] being treated for lung cancer.  He tested positive for COVID-19 approximately 5 days ago.  He has been taking Paxlovid.  He is feeling much better but comes in for feeling that his ears are underwater.  He is concerned about an ear infection as he has cancer, and he states that he does not want to leave anything untreated if he does have an infection.  The other complaints he endorsed to the triage nurse including dizziness and nausea are no longer present.  He did have headache and fever at the onset.  However, he states that since taking Paxlovid, he feels like he does not even have COVID-19.  The history is provided by the patient.  Ear Fullness This is a new problem. The current episode started more than 2 days ago. The problem occurs constantly. The problem has not changed since onset.Pertinent negatives include no chest pain, no abdominal pain, no headaches and no shortness of breath. Nothing aggravates the symptoms. Nothing relieves the symptoms. He has tried nothing for the symptoms. The treatment provided no relief.      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   Past Surgical History:  Procedure Laterality Date   TESTICLE SURGERY         No family history on file.  Social History   Tobacco Use   Smoking status: Every Day   Smokeless tobacco: Never  Vaping Use   Vaping Use: Never used  Substance Use Topics   Alcohol use: No    Home Medications Prior to Admission medications   Medication Sig Start Date End Date Taking? Authorizing Provider  azithromycin  (ZITHROMAX Z-PAK) 250 MG tablet Take 2 tablets (500 mg) on  Day 1,  followed by 1 tablet (250 mg) once daily on Days 2 through 5. 01/28/19   Laban Emperor, PA-C  oxyCODONE-acetaminophen (PERCOCET) 5-325 MG tablet Take 1-2 tablets by mouth every 4 (four) hours as needed for severe pain. Patient not taking: Reported on 08/30/2018 03/11/16   Johnn Hai, PA-C  sulfamethoxazole-trimethoprim (BACTRIM DS,SEPTRA DS) 800-160 MG tablet Take 1 tablet by mouth 2 (two) times daily. Patient not taking: Reported on 08/30/2018 03/11/16   Johnn Hai, PA-C    Allergies    Patient has no known allergies.  Review of Systems   Review of Systems  Constitutional:  Negative for chills and fever.  HENT:  Positive for hearing loss. Negative for ear pain and sore throat.   Eyes:  Negative for pain and visual disturbance.  Respiratory:  Negative for cough and shortness of breath.   Cardiovascular:  Negative for chest pain and palpitations.  Gastrointestinal:  Negative for abdominal pain and vomiting.  Genitourinary:  Negative for dysuria and hematuria.  Musculoskeletal:  Negative for arthralgias and back pain.  Skin:  Negative for color change and rash.  Neurological:  Negative for seizures, syncope and headaches.  All other systems reviewed and are negative.  Physical Exam Updated Vital Signs BP (!) 112/91 (BP Location: Right  Arm)   Pulse 78   Temp 98.7 F (37.1 C) (Oral)   Resp 17   Ht 5\' 8"  (1.727 m)   Wt 81.6 kg   SpO2 99%   BMI 27.37 kg/m   Physical Exam Vitals and nursing note reviewed.  Constitutional:      Appearance: Normal appearance.  HENT:     Head: Normocephalic and atraumatic.     Right Ear: Tympanic membrane, ear canal and external ear normal.     Left Ear: Tympanic membrane, ear canal and external ear normal.     Nose: Nose normal.     Mouth/Throat:     Mouth: Mucous membranes are moist.  Eyes:     Conjunctiva/sclera: Conjunctivae normal.  Pulmonary:     Effort:  Pulmonary effort is normal. No respiratory distress.  Musculoskeletal:        General: No deformity. Normal range of motion.     Cervical back: Normal range of motion. No rigidity.  Lymphadenopathy:     Cervical: No cervical adenopathy.  Skin:    General: Skin is warm and dry.  Neurological:     General: No focal deficit present.     Mental Status: He is alert and oriented to person, place, and time. Mental status is at baseline.  Psychiatric:        Mood and Affect: Mood normal.    ED Results / Procedures / Treatments   Labs (all labs ordered are listed, but only abnormal results are displayed) Labs Reviewed  BASIC METABOLIC PANEL  CBC  URINALYSIS, ROUTINE W REFLEX MICROSCOPIC  CBG MONITORING, ED    EKG None  Radiology No results found.  Procedures Procedures   Medications Ordered in ED Medications - No data to display  ED Course  I have reviewed the triage vital signs and the nursing notes.  Pertinent labs & imaging results that were available during my care of the patient were reviewed by me and considered in my medical decision making (see chart for details).    MDM Rules/Calculators/A&P                           Rodel Kypton [place] has COVID-19 and presents mainly complaining of loss of hearing and bilateral ear fullness.  No evidence of otitis media.  He was counseled on symptomatic management and given instructions for eustachian tube dysfunction.  He was reassured that his vital signs are within normal limits.  He is feeling fine.  Labs were drawn prior to me seeing him, and he did not wish to stay for any results. Final Clinical Impression(s) / ED Diagnoses Final diagnoses:  COVID-19  Ear pain, bilateral    Rx / DC Orders ED Discharge Orders     None        Arnaldo Natal, MD 02/25/21 1259

## 2021-02-25 NOTE — ED Triage Notes (Signed)
Pt tested Positive for Covid on Saturday, started on Paxlovid on Sat night, he also reports dizziness and nausea. Pt concerned due to having lung cancer. No hearing concerns prior to testing Positive

## 2021-05-05 ENCOUNTER — Other Ambulatory Visit: Payer: Self-pay | Admitting: Internal Medicine

## 2021-05-05 ENCOUNTER — Other Ambulatory Visit (HOSPITAL_COMMUNITY): Payer: Self-pay | Admitting: Internal Medicine

## 2021-05-05 DIAGNOSIS — C3490 Malignant neoplasm of unspecified part of unspecified bronchus or lung: Secondary | ICD-10-CM

## 2021-05-19 ENCOUNTER — Ambulatory Visit (HOSPITAL_COMMUNITY)
Admission: RE | Admit: 2021-05-19 | Discharge: 2021-05-19 | Disposition: A | Discharge status: Home / Self Care | Payer: Medicaid Other | Source: Ambulatory Visit | Attending: Internal Medicine | Admitting: Internal Medicine

## 2021-05-19 ENCOUNTER — Other Ambulatory Visit: Payer: Self-pay

## 2021-05-19 ENCOUNTER — Ambulatory Visit (HOSPITAL_COMMUNITY): Payer: Medicaid Other

## 2021-05-19 ENCOUNTER — Encounter (HOSPITAL_COMMUNITY): Payer: Self-pay

## 2021-05-19 ENCOUNTER — Other Ambulatory Visit (HOSPITAL_COMMUNITY): Payer: Self-pay | Admitting: Internal Medicine

## 2021-05-19 DIAGNOSIS — C3490 Malignant neoplasm of unspecified part of unspecified bronchus or lung: Secondary | ICD-10-CM | POA: Diagnosis not present

## 2021-05-19 MED ORDER — IOHEXOL 350 MG/ML SOLN
80.0000 mL | Freq: Once | INTRAVENOUS | Status: AC | PRN
  Administered 2021-05-19: 80 mL via INTRAVENOUS

## 2021-07-01 ENCOUNTER — Encounter (HOSPITAL_COMMUNITY): Payer: Self-pay | Admitting: Cardiology

## 2021-07-01 ENCOUNTER — Ambulatory Visit (HOSPITAL_COMMUNITY): Admit: 2021-07-01 | Payer: Medicaid Other | Admitting: Cardiology

## 2021-07-01 ENCOUNTER — Other Ambulatory Visit (HOSPITAL_COMMUNITY): Payer: Self-pay

## 2021-07-01 ENCOUNTER — Encounter (HOSPITAL_COMMUNITY)
Admission: EM | Disposition: A | Discharge status: Home / Self Care | Payer: Self-pay | Source: Home / Self Care | Attending: Emergency Medicine

## 2021-07-01 ENCOUNTER — Other Ambulatory Visit: Payer: Self-pay

## 2021-07-01 ENCOUNTER — Observation Stay (HOSPITAL_COMMUNITY)
Admission: EM | Admit: 2021-07-01 | Discharge: 2021-07-02 | Disposition: A | Discharge status: Home / Self Care | Payer: Medicaid Other | Attending: Cardiology | Admitting: Cardiology

## 2021-07-01 ENCOUNTER — Emergency Department (HOSPITAL_COMMUNITY): Payer: Medicaid Other

## 2021-07-01 ENCOUNTER — Inpatient Hospital Stay (HOSPITAL_COMMUNITY): Payer: Medicaid Other

## 2021-07-01 DIAGNOSIS — Z79899 Other long term (current) drug therapy: Secondary | ICD-10-CM

## 2021-07-01 DIAGNOSIS — K219 Gastro-esophageal reflux disease without esophagitis: Secondary | ICD-10-CM | POA: Diagnosis present

## 2021-07-01 DIAGNOSIS — I2510 Atherosclerotic heart disease of native coronary artery without angina pectoris: Secondary | ICD-10-CM

## 2021-07-01 DIAGNOSIS — I213 ST elevation (STEMI) myocardial infarction of unspecified site: Secondary | ICD-10-CM | POA: Diagnosis present

## 2021-07-01 DIAGNOSIS — E7800 Pure hypercholesterolemia, unspecified: Secondary | ICD-10-CM | POA: Diagnosis not present

## 2021-07-01 DIAGNOSIS — F17200 Nicotine dependence, unspecified, uncomplicated: Secondary | ICD-10-CM | POA: Insufficient documentation

## 2021-07-01 DIAGNOSIS — C3411 Malignant neoplasm of upper lobe, right bronchus or lung: Secondary | ICD-10-CM | POA: Diagnosis present

## 2021-07-01 DIAGNOSIS — I214 Non-ST elevation (NSTEMI) myocardial infarction: Principal | ICD-10-CM | POA: Insufficient documentation

## 2021-07-01 DIAGNOSIS — Z8673 Personal history of transient ischemic attack (TIA), and cerebral infarction without residual deficits: Secondary | ICD-10-CM | POA: Diagnosis not present

## 2021-07-01 DIAGNOSIS — Z955 Presence of coronary angioplasty implant and graft: Secondary | ICD-10-CM

## 2021-07-01 DIAGNOSIS — Z20822 Contact with and (suspected) exposure to covid-19: Secondary | ICD-10-CM | POA: Diagnosis not present

## 2021-07-01 DIAGNOSIS — E039 Hypothyroidism, unspecified: Secondary | ICD-10-CM | POA: Diagnosis present

## 2021-07-01 DIAGNOSIS — F17210 Nicotine dependence, cigarettes, uncomplicated: Secondary | ICD-10-CM | POA: Diagnosis present

## 2021-07-01 DIAGNOSIS — Z8547 Personal history of malignant neoplasm of testis: Secondary | ICD-10-CM

## 2021-07-01 DIAGNOSIS — Z79890 Hormone replacement therapy (postmenopausal): Secondary | ICD-10-CM | POA: Diagnosis not present

## 2021-07-01 DIAGNOSIS — I2109 ST elevation (STEMI) myocardial infarction involving other coronary artery of anterior wall: Secondary | ICD-10-CM | POA: Diagnosis present

## 2021-07-01 HISTORY — PX: LEFT HEART CATH AND CORONARY ANGIOGRAPHY: CATH118249

## 2021-07-01 HISTORY — DX: Atherosclerotic heart disease of native coronary artery without angina pectoris: I25.10

## 2021-07-01 LAB — HEPARIN ANTI-XA: Heparin LMW: 0.95 IU/mL

## 2021-07-01 LAB — COMPREHENSIVE METABOLIC PANEL
ALT: 15 U/L (ref 0–44)
AST: 22 U/L (ref 15–41)
Albumin: 3 g/dL — ABNORMAL LOW (ref 3.5–5.0)
Alkaline Phosphatase: 105 U/L (ref 38–126)
Anion gap: 11 (ref 5–15)
BUN: 20 mg/dL (ref 6–20)
CO2: 16 mmol/L — ABNORMAL LOW (ref 22–32)
Calcium: 8.4 mg/dL — ABNORMAL LOW (ref 8.9–10.3)
Chloride: 109 mmol/L (ref 98–111)
Creatinine, Ser: 1.38 mg/dL — ABNORMAL HIGH (ref 0.61–1.24)
GFR, Estimated: >60 mL/min (ref >60)
Glucose, Bld: 118 mg/dL — ABNORMAL HIGH (ref 70–99)
Potassium: 3.8 mmol/L (ref 3.5–5.1)
Sodium: 136 mmol/L (ref 135–145)
Total Bilirubin: 0.2 mg/dL — ABNORMAL LOW (ref 0.3–1.2)
Total Protein: 5.4 g/dL — ABNORMAL LOW (ref 6.5–8.1)

## 2021-07-01 LAB — CBC WITH DIFFERENTIAL/PLATELET
Abs Immature Granulocytes: 0.1 10*3/uL — ABNORMAL HIGH (ref 0.00–0.07)
Basophils Absolute: 0 10*3/uL (ref 0.0–0.1)
Basophils Relative: 0 %
Eosinophils Absolute: 0.1 10*3/uL (ref 0.0–0.5)
Eosinophils Relative: 1 %
HCT: 39.9 % (ref 39.0–52.0)
Hemoglobin: 13.5 g/dL (ref 13.0–17.0)
Immature Granulocytes: 1 %
Lymphocytes Relative: 19 %
Lymphs Abs: 1.8 10*3/uL (ref 0.7–4.0)
MCH: 29.6 pg (ref 26.0–34.0)
MCHC: 33.8 g/dL (ref 30.0–36.0)
MCV: 87.5 fL (ref 80.0–100.0)
Monocytes Absolute: 0.8 10*3/uL (ref 0.1–1.0)
Monocytes Relative: 8 %
Neutro Abs: 6.7 10*3/uL (ref 1.7–7.7)
Neutrophils Relative %: 71 %
Platelets: 240 10*3/uL (ref 150–400)
RBC: 4.56 MIL/uL (ref 4.22–5.81)
RDW: 14.6 % (ref 11.5–15.5)
WBC: 9.5 10*3/uL (ref 4.0–10.5)
nRBC: 0 % (ref 0.0–0.2)

## 2021-07-01 LAB — ECHOCARDIOGRAM COMPLETE
AR max vel: 2.4 cm2
AV Area VTI: 2.16 cm2
AV Area mean vel: 2.26 cm2
AV Mean grad: 3 mmHg
AV Peak grad: 5.2 mmHg
Ao pk vel: 1.14 m/s
Area-P 1/2: 3.5 cm2
S' Lateral: 2.9 cm
Weight: 2878.33 oz

## 2021-07-01 LAB — PROTIME-INR
INR: 1.2 (ref 0.8–1.2)
Prothrombin Time: 15.6 seconds — ABNORMAL HIGH (ref 11.4–15.2)

## 2021-07-01 LAB — TROPONIN I (HIGH SENSITIVITY)
Troponin I (High Sensitivity): 60 ng/L — ABNORMAL HIGH (ref <18)
Troponin I (High Sensitivity): 1722 ng/L (ref <18)
Troponin I (High Sensitivity): 8739 ng/L (ref <18)

## 2021-07-01 LAB — LIPID PANEL
Cholesterol: 155 mg/dL (ref 0–200)
HDL: 34 mg/dL — ABNORMAL LOW (ref >40)
LDL Cholesterol: 105 mg/dL — ABNORMAL HIGH (ref 0–99)
Total CHOL/HDL Ratio: 4.6 RATIO
Triglycerides: 82 mg/dL (ref <150)
VLDL: 16 mg/dL (ref 0–40)

## 2021-07-01 LAB — POCT I-STAT, CHEM 8
BUN: 19 mg/dL (ref 6–20)
Calcium, Ion: 1.18 mmol/L (ref 1.15–1.40)
Chloride: 109 mmol/L (ref 98–111)
Creatinine, Ser: 1.3 mg/dL — ABNORMAL HIGH (ref 0.61–1.24)
Glucose, Bld: 121 mg/dL — ABNORMAL HIGH (ref 70–99)
HCT: 39 % (ref 39.0–52.0)
Hemoglobin: 13.3 g/dL (ref 13.0–17.0)
Potassium: 3.7 mmol/L (ref 3.5–5.1)
Sodium: 141 mmol/L (ref 135–145)
TCO2: 16 mmol/L — ABNORMAL LOW (ref 22–32)

## 2021-07-01 LAB — HEMOGLOBIN A1C
Hgb A1c MFr Bld: 6.3 % — ABNORMAL HIGH (ref 4.8–5.6)
Mean Plasma Glucose: 134.11 mg/dL

## 2021-07-01 LAB — MRSA NEXT GEN BY PCR, NASAL: MRSA by PCR Next Gen: NOT DETECTED

## 2021-07-01 LAB — APTT: aPTT: 160 seconds — ABNORMAL HIGH (ref 24–36)

## 2021-07-01 LAB — POCT ACTIVATED CLOTTING TIME: Activated Clotting Time: 353 seconds

## 2021-07-01 SURGERY — LEFT HEART CATH AND CORONARY ANGIOGRAPHY
Anesthesia: LOCAL

## 2021-07-01 MED ORDER — PERFLUTREN LIPID MICROSPHERE
1.0000 mL | INTRAVENOUS | Status: AC | PRN
Start: 2021-07-01 — End: 2021-07-01
  Administered 2021-07-01: 2 mL via INTRAVENOUS
  Filled 2021-07-01: qty 10

## 2021-07-01 MED ORDER — HEPARIN SODIUM (PORCINE) 1000 UNIT/ML IJ SOLN
INTRAMUSCULAR | Status: AC
  Filled 2021-07-01: qty 10

## 2021-07-01 MED ORDER — TIROFIBAN HCL IN NACL 5-0.9 MG/100ML-% IV SOLN
INTRAVENOUS | Status: AC | PRN
  Administered 2021-07-01: .15 ug/kg/min via INTRAVENOUS

## 2021-07-01 MED ORDER — TIROFIBAN (AGGRASTAT) BOLUS VIA INFUSION
INTRAVENOUS | Status: DC | PRN
  Administered 2021-07-01: 2040 ug via INTRAVENOUS

## 2021-07-01 MED ORDER — HEPARIN SODIUM (PORCINE) 1000 UNIT/ML IJ SOLN
INTRAMUSCULAR | Status: DC | PRN
  Administered 2021-07-01: 8000 [IU] via INTRAVENOUS

## 2021-07-01 MED ORDER — METOPROLOL SUCCINATE ER 25 MG PO TB24
25.0000 mg | ORAL_TABLET | Freq: Every day | ORAL | Status: DC
  Administered 2021-07-01 – 2021-07-02 (×2): 25 mg via ORAL
  Filled 2021-07-01 (×2): qty 1

## 2021-07-01 MED ORDER — ATORVASTATIN CALCIUM 80 MG PO TABS
80.0000 mg | ORAL_TABLET | Freq: Every day | ORAL | Status: DC
  Administered 2021-07-01 – 2021-07-02 (×2): 80 mg via ORAL
  Filled 2021-07-01 (×2): qty 1

## 2021-07-01 MED ORDER — HEPARIN (PORCINE) IN NACL 1000-0.9 UT/500ML-% IV SOLN
INTRAVENOUS | Status: DC | PRN
  Administered 2021-07-01 (×2): 500 mL

## 2021-07-01 MED ORDER — LIDOCAINE HCL (PF) 1 % IJ SOLN
INTRAMUSCULAR | Status: AC
  Filled 2021-07-01: qty 30

## 2021-07-01 MED ORDER — ALPRAZOLAM 0.25 MG PO TABS: 0.2500 mg | ORAL_TABLET | Freq: Two times a day (BID) | ORAL | Status: DC | PRN

## 2021-07-01 MED ORDER — VERAPAMIL HCL 2.5 MG/ML IV SOLN
INTRAVENOUS | Status: AC
  Filled 2021-07-01: qty 2

## 2021-07-01 MED ORDER — SODIUM CHLORIDE 0.9 % IV SOLN: 250.0000 mL | INTRAVENOUS | Status: DC | PRN

## 2021-07-01 MED ORDER — ACETAMINOPHEN 325 MG PO TABS: 650.0000 mg | ORAL_TABLET | ORAL | Status: DC | PRN

## 2021-07-01 MED ORDER — FENTANYL CITRATE (PF) 100 MCG/2ML IJ SOLN
INTRAMUSCULAR | Status: AC
  Filled 2021-07-01: qty 2

## 2021-07-01 MED ORDER — NITROGLYCERIN 1 MG/10 ML FOR IR/CATH LAB
INTRA_ARTERIAL | Status: AC
  Filled 2021-07-01: qty 10

## 2021-07-01 MED ORDER — TICAGRELOR 90 MG PO TABS
ORAL_TABLET | ORAL | Status: AC
  Filled 2021-07-01: qty 2

## 2021-07-01 MED ORDER — TIROFIBAN HCL IN NACL 5-0.9 MG/100ML-% IV SOLN
INTRAVENOUS | Status: AC
  Filled 2021-07-01: qty 100

## 2021-07-01 MED ORDER — SODIUM CHLORIDE 0.9 % IV SOLN: INTRAVENOUS | Status: DC

## 2021-07-01 MED ORDER — ONDANSETRON HCL 4 MG/2ML IJ SOLN: 4.0000 mg | Freq: Four times a day (QID) | INTRAMUSCULAR | Status: DC | PRN

## 2021-07-01 MED ORDER — SODIUM CHLORIDE 0.9% FLUSH
3.0000 mL | Freq: Two times a day (BID) | INTRAVENOUS | Status: DC
  Administered 2021-07-02: 3 mL via INTRAVENOUS

## 2021-07-01 MED ORDER — NITROGLYCERIN 0.4 MG SL SUBL
0.4000 mg | SUBLINGUAL_TABLET | SUBLINGUAL | Status: DC | PRN
  Administered 2021-07-01 (×3): 0.4 mg via SUBLINGUAL
  Filled 2021-07-01: qty 1

## 2021-07-01 MED ORDER — ASPIRIN 81 MG PO CHEW
81.0000 mg | CHEWABLE_TABLET | Freq: Every day | ORAL | Status: DC
  Administered 2021-07-02: 81 mg via ORAL
  Filled 2021-07-01: qty 1

## 2021-07-01 MED ORDER — SODIUM CHLORIDE 0.9 % WEIGHT BASED INFUSION
1.0000 mL/kg/h | INTRAVENOUS | Status: AC
  Administered 2021-07-01: 1 mL/kg/h via INTRAVENOUS

## 2021-07-01 MED ORDER — HEPARIN (PORCINE) IN NACL 1000-0.9 UT/500ML-% IV SOLN
INTRAVENOUS | Status: AC
  Filled 2021-07-01: qty 1000

## 2021-07-01 MED ORDER — LIDOCAINE HCL (PF) 1 % IJ SOLN
INTRAMUSCULAR | Status: DC | PRN
  Administered 2021-07-01: 2 mL via INTRADERMAL

## 2021-07-01 MED ORDER — FENTANYL CITRATE (PF) 100 MCG/2ML IJ SOLN
INTRAMUSCULAR | Status: DC | PRN
  Administered 2021-07-01 (×2): 50 ug via INTRAVENOUS

## 2021-07-01 MED ORDER — NITROGLYCERIN 1 MG/10 ML FOR IR/CATH LAB
INTRA_ARTERIAL | Status: DC | PRN
  Administered 2021-07-01: 200 ug via INTRACORONARY

## 2021-07-01 MED ORDER — OXYCODONE HCL 5 MG PO TABS
5.0000 mg | ORAL_TABLET | ORAL | Status: DC | PRN
  Administered 2021-07-01: 10 mg via ORAL
  Filled 2021-07-01: qty 2

## 2021-07-01 MED ORDER — ASPIRIN 81 MG PO CHEW: 324.0000 mg | CHEWABLE_TABLET | Freq: Once | ORAL | Status: DC

## 2021-07-01 MED ORDER — TICAGRELOR 90 MG PO TABS
ORAL_TABLET | ORAL | Status: DC | PRN
  Administered 2021-07-01: 180 mg via ORAL

## 2021-07-01 MED ORDER — VERAPAMIL HCL 2.5 MG/ML IV SOLN
INTRAVENOUS | Status: DC | PRN
  Administered 2021-07-01: 5 mL via INTRA_ARTERIAL
  Administered 2021-07-01: 10 mL via INTRA_ARTERIAL

## 2021-07-01 MED ORDER — HEPARIN SODIUM (PORCINE) 5000 UNIT/ML IJ SOLN
60.0000 [IU]/kg | Freq: Once | INTRAMUSCULAR | Status: AC
  Administered 2021-07-01: 4000 [IU] via INTRAVENOUS

## 2021-07-01 MED ORDER — TIROFIBAN HCL IN NACL 5-0.9 MG/100ML-% IV SOLN
0.1500 ug/kg/min | INTRAVENOUS | Status: AC
  Administered 2021-07-01: 0.15 ug/kg/min via INTRAVENOUS
  Filled 2021-07-01: qty 100

## 2021-07-01 MED ORDER — IOHEXOL 350 MG/ML SOLN
INTRAVENOUS | Status: DC | PRN
  Administered 2021-07-01: 240 mL

## 2021-07-01 MED ORDER — SODIUM CHLORIDE 0.9% FLUSH: 3.0000 mL | INTRAVENOUS | Status: DC | PRN

## 2021-07-01 MED ORDER — ONDANSETRON HCL 4 MG/2ML IJ SOLN: 4.0000 mg | Freq: Once | INTRAMUSCULAR | Status: DC

## 2021-07-01 MED ORDER — FENTANYL CITRATE PF 50 MCG/ML IJ SOSY
50.0000 ug | PREFILLED_SYRINGE | Freq: Once | INTRAMUSCULAR | Status: AC
  Administered 2021-07-01: 50 ug via INTRAVENOUS
  Filled 2021-07-01: qty 1

## 2021-07-01 MED ORDER — TICAGRELOR 90 MG PO TABS
90.0000 mg | ORAL_TABLET | Freq: Two times a day (BID) | ORAL | Status: DC
  Administered 2021-07-01 – 2021-07-02 (×2): 90 mg via ORAL
  Filled 2021-07-01 (×2): qty 1

## 2021-07-01 SURGICAL SUPPLY — 16 items
CATH INFINITI JR4 5F (CATHETERS) ×3 IMPLANT
CATH LAUNCHER 6FR AL.75 (CATHETERS) ×3 IMPLANT
CATH OPTITORQUE TIG 4.0 5F (CATHETERS) ×3 IMPLANT
CATH VISTA GUIDE 6FR XB3.5 (CATHETERS) ×3 IMPLANT
DEVICE RAD COMP TR BAND LRG (VASCULAR PRODUCTS) ×3 IMPLANT
GLIDESHEATH SLEND A-KIT 6F 22G (SHEATH) ×3 IMPLANT
GUIDEWIRE INQWIRE 1.5J.035X260 (WIRE) ×1 IMPLANT
INQWIRE 1.5J .035X260CM (WIRE) ×3
KIT ESSENTIALS PG (KITS) ×3 IMPLANT
KIT HEART LEFT (KITS) ×3 IMPLANT
PACK CARDIAC CATHETERIZATION (CUSTOM PROCEDURE TRAY) ×3 IMPLANT
STENT ONYX FRONTIER 3.5X30 (Permanent Stent) ×3 IMPLANT
TRANSDUCER W/STOPCOCK (MISCELLANEOUS) ×3 IMPLANT
TUBING CIL FLEX 10 FLL-RA (TUBING) ×3 IMPLANT
WIRE ASAHI PROWATER 180CM (WIRE) ×3 IMPLANT
WIRE HI TORQ BMW 190CM (WIRE) ×3 IMPLANT

## 2021-07-01 NOTE — Progress Notes (Signed)
  Echocardiogram 2D Echocardiogram has been performed.  Merrie Roof F 07/01/2021, 3:56 PM

## 2021-07-01 NOTE — TOC Benefit Eligibility Note (Signed)
Patient Teacher, English as a foreign language completed.    The patient is currently admitted and upon discharge could be taking Brilinta 90 mg.  The current 30 day co-pay is, $4.00.   The patient is insured through [employer] Patient Advocate Specialist [place] Patient Advocate Team Direct Number: [phone]  Fax: [phone]

## 2021-07-01 NOTE — ED Triage Notes (Signed)
Pt bib ems for midsternal chest pain, gradual w/burning sensation and tingling in both arms, pt given 324 asa, 200 ml fluids, 4mg  zof

## 2021-07-01 NOTE — H&P (Signed)
CARDIOLOGY ADMIT NOTE   Patient ID: Jacob [name] MRN: [id number] DOB/AGE: 11-17-1970 50 y.o.  Admit date: 07/01/2021 Primary Physician:  Patient, No Pcp Per (Inactive)  Patient ID: Jacob [name], male    DOB: 10/12/1970, 50 y.o.   MRN: [id number]  Chief Complaint  Patient presents with   Code STEMI   HPI:    Jacob [name]  is a 50 y.o. Caucasian male patient with?  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  He has never had this before, he does have occasional GERD, his wife is present at bedside.  Past Medical History:  Diagnosis Date   Lung cancer ([place])    Stroke ([place])    Testicle cancer [facility])    Past Surgical History:  Procedure Laterality Date   TESTICLE SURGERY     Social History   Socioeconomic History   Marital status: Single    Spouse name: Not on file   Number of children: Not on file   Years of education: Not on file   Highest education level: Not on file  Occupational History   Not on file  Tobacco Use   Smoking status: Every Day   Smokeless tobacco: Never  Vaping Use   Vaping Use: Never used  Substance and Sexual Activity   Alcohol use: No   Drug use: Not on file   Sexual activity: Not on file  Other Topics Concern   Not on file  Social History Narrative   Not on file   Social Determinants of Health   Financial Resource Strain: Not on file  Food Insecurity: Not on file  Transportation Needs: Not on file  Physical Activity: Not on file   Stress: Not on file  Social Connections: Not on file  Intimate Partner Violence: Not on file   No family history on file.  ROS  Review of Systems  Cardiovascular:  Positive for chest pain, dyspnea on exertion and leg swelling (chronic right leg).  Gastrointestinal:  Negative for melena.  All other systems reviewed and are negative. Objective   Vitals with BMI 07/01/2021 07/01/2021 07/01/2021  Height - - -  Weight - - -  BMI - - -  Systolic 629 476 546  Diastolic 68 92 88  Pulse 89 80 92    Physical Exam Constitutional:      General: He is in acute distress.     Appearance: He is diaphoretic.  Eyes:     Extraocular Movements: Extraocular movements intact.  Neck:     Vascular: No carotid bruit or JVD.  Cardiovascular:     Rate and Rhythm: Normal rate and regular rhythm.     Pulses: Intact distal pulses.     Heart sounds: Normal heart sounds. No murmur heard.   No gallop.  Pulmonary:     Effort: Pulmonary effort is normal.     Breath sounds: Normal breath sounds.  Abdominal:     General: Bowel sounds are normal.     Palpations: Abdomen is soft.  Musculoskeletal:     Cervical back:  Normal range of motion and neck supple.     Right lower leg: Edema present.     Left lower leg: No edema.  Skin:    General: Skin is warm.  Neurological:     General: No focal deficit present.     Mental Status: He is alert and oriented to person, place, and time.   Laboratory examination:   Recent Labs    12/24/20 1138 02/25/21 1213 07/01/21 1329  NA 138 137 136  K 4.8 4.4 3.8  CL 108 103 109  CO2 24 27 16*  GLUCOSE 107* 84 118*  BUN 23* 15 20  CREATININE 1.34* 1.05 1.38*  CALCIUM 8.4* 8.4* 8.4*  GFRNONAA >60 >60 >60   estimated creatinine clearance is 61.3 mL/min (A) (by C-G formula based on SCr of 1.38 mg/dL (H)).  CMP Latest Ref Rng & Units 07/01/2021 02/25/2021 12/24/2020  Glucose 70 - 99 mg/dL 118(H) 84 107(H)  BUN 6 - 20 mg/dL 20 15 23(H)  Creatinine 0.61 - 1.24 mg/dL  1.38(H) 1.05 1.34(H)  Sodium 135 - 145 mmol/L 136 137 138  Potassium 3.5 - 5.1 mmol/L 3.8 4.4 4.8  Chloride 98 - 111 mmol/L 109 103 108  CO2 22 - 32 mmol/L 16(L) 27 24  Calcium 8.9 - 10.3 mg/dL 8.4(L) 8.4(L) 8.4(L)  Total Protein 6.5 - 8.1 g/dL 5.4(L) - 5.9(L)  Total Bilirubin 0.3 - 1.2 mg/dL 0.2(L) - 0.3  Alkaline Phos 38 - 126 U/L 105 - 101  AST 15 - 41 U/L 22 - 14(L)  ALT 0 - 44 U/L 15 - 14   CBC Latest Ref Rng & Units 07/01/2021 02/25/2021 12/24/2020  WBC 4.0 - 10.5 K/uL 9.5 5.1 8.4  Hemoglobin 13.0 - 17.0 g/dL 13.5 14.3 14.5  Hematocrit 39.0 - 52.0 % 39.9 43.9 45.3  Platelets 150 - 400 K/uL 240 188 262   Lipid Panel     Component Value Date/Time   CHOL 155 07/01/2021 1329   TRIG 82 07/01/2021 1329   HDL 34 (L) 07/01/2021 1329   CHOLHDL 4.6 07/01/2021 1329   VLDL 16 07/01/2021 1329   LDLCALC 105 (H) 07/01/2021 1329   HEMOGLOBIN A1C Lab Results  Component Value Date   HGBA1C 6.3 (H) 07/01/2021   MPG 134.11 07/01/2021   TSH Recent Labs    12/24/20 1138  TSH 29.888*   BNP (last 3 results) No results for input(s): BNP in the last 8760 hours. Cardiac Panel (last 3 results) Recent Labs    07/01/21 1329  TROPONINIHS 60*     Medications and allergies  No Known Allergies   Prior to Admission medications   Medication Sig Start Date End Date Taking? Authorizing Provider  azithromycin (ZITHROMAX Z-PAK) 250 MG tablet Take 2 tablets (500 mg) on  Day 1,  followed by 1 tablet (250 mg) once daily on Days 2 through 5. 01/28/19   Laban Emperor, PA-C  oxyCODONE-acetaminophen (PERCOCET) 5-325 MG tablet Take 1-2 tablets by mouth every 4 (four) hours as needed for severe pain. Patient not taking: Reported on 08/30/2018 03/11/16   Johnn Hai, PA-C  sulfamethoxazole-trimethoprim (BACTRIM DS,SEPTRA DS) 800-160 MG tablet Take 1 tablet by mouth 2 (two) times daily. Patient not taking: Reported on 08/30/2018 03/11/16   Johnn Hai, PA-C     [START ON 07/02/2021] sodium chloride      sodium chloride 1 mL/kg/hr (07/01/21 1503)   tirofiban 0.15 mcg/kg/min (07/01/21 1708)    Current Outpatient Medications  Medication Instructions   azithromycin (  ZITHROMAX Z-PAK) 250 MG tablet Take 2 tablets (500 mg) on  Day 1,  followed by 1 tablet (250 mg) once daily on Days 2 through 5.   oxyCODONE-acetaminophen (PERCOCET) 5-325 MG tablet 1-2 tablets, Oral, Every 4 hours PRN   sulfamethoxazole-trimethoprim (BACTRIM DS,SEPTRA DS) 800-160 MG tablet 1 tablet, Oral, 2 times daily   No intake/output data recorded. Total I/O In: 323 [P.O.:240; I.V.:151] Out: -     Radiology:   MRI Brain 01/19/2020: 1. Two new (acute or early subacute) small infarctions in the left cerebellar hemisphere.  2. New (acute or early subacute) small infarction in the posterior right occipital lobe. This may account for the reported vision change.  3. At least 5 additional new (acute or early subacute) small infarctions scattered in the bilateral occipital and parietal lobes (images marked in PACS).  4. Prior acute infarctions have evolved to become subacute, and some now show mild enhancement, which is an expected finding.   DG Chest Portable 1 View Result Date: 07/01/2021 CLINICAL DATA:  Chest pain EXAM: PORTABLE CHEST 1 VIEW COMPARISON:  None. FINDINGS: The heart size and mediastinal contours are within normal limits. Both lungs are clear. The visualized skeletal structures are unremarkable. IMPRESSION: No active disease. Electronically Signed   By: Keane Police D.O.   On: 07/01/2021 13:40    Cardiac Studies:   Left Heart Catheterization 07/01/21:  LV: 141/21, EDP 36 mmHg.  Ao 150/95, mean 118 mmHg.  There was no pressure gradient across the aortic valve. LM: Large vessel. LAD: Thrombotic/ulcerated proximal LAD 80 to 90% stenosis with TIMI II flow.  Apical LAD diffusely diseased.  Direct stenting ONYX FRONTIER 3.5X30 mm DES. Maximum pressure: 16 atm. Inflation time: 45 sec. Stent strut is well apposed.  TIMI  II to TIMI-3 flow at the end of the procedure, distal embolization of the apical LAD of no clinical consequence with occlusion.  It is also diffusely diseased at the apex. CX: Very large-caliber vessel.  Mid segment has a 40% stenosis. 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  There are contralateral collaterals noted and ipsilateral collaterals noted to the distal right coronary artery.  The artery is flush occluded after the RV branch, I did try to open up the vessel however unable to obtain access to the true lumen.  However with the attempted angioplasty, contralateral flow improved all the way back to the proximal segment of the RCA from the LAD.  Recommendation: Aggressive medical therapy, he will be brought back probably in 4 to 6 weeks for relook at the right coronary artery which is now type II collaterals from the LAD.  The vessel itself is fairly large and may need repeat angioplasty.  Smoking cessation and risk factor modification is indicated.  240 mL contrast utilized.  EKG:  EKG 07/01/2021 at 1316 hrs.: Normal sinus rhythm at rate of 61 bpm, hyperacute T waves, inferior and anterolateral STEMI.  EKG post PCI at 15 4 hours on 10/30/2020: No significant change in the EKG.  Compared to 02/25/2021, prominent T waves not present, previously normal EKG.  Assessment  Jacob [name] is a 50 y.o. Caucasian male patient with?  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 daily with near resolution of his lung cancer and only has a very small lesion on his right upper lobe, hyperlipidemia, hyperglycemia, tobacco use disorder cardioembolic stroke in June 5573,  who presented to the emergency room this afternoon with 2 to 3 hours of severe retrosternal chest pain with radiation to the arm and neck associated with diaphoresis, nausea and vomiting.   1.   Acute anterolateral STEMI 2.  Cardioembolic stroke in June 1642 3.  Stage IV lung cancer, has responded well to chemotherapy with capmatinib 400 mg p.o. daily with near cure of his lung cancer.  He has a small residual lesion in the right upper lobe. 4.  Hyperglycemia 5.  Hypercholesterolemia 6.  Tobacco use disorder  Recommendations:   Patient in acute distress and with acute STEMI, will take him emergently to the cardiac catheterization lab.  I have discussed the risks and benefits with patient's wife and patient including but not limited to <1% risk of death stroke, heart attack or need for urgent bypass surgery.  I also called his oncologist, Jone Baseman, MD discussed the options of therapy, who recommended that he proceed with emergent catheterization and to manage him as usual in the setting of STEMI and nothing special.  I completed the cardiac catheterization and angioplasty and discussed with patient's wife, reassured her.  I spent a total of 45 minutes with face-to-face encounter and management of critically ill and life-threatening acute anterolateral STEMI, critical care provided 45 minutes.   Adrian Prows, MD, [facility] 07/01/2021, 6:04 PM Office: [phone] Fax: [phone] Pager: [phone]

## 2021-07-01 NOTE — Progress Notes (Signed)
Chaplain paged to ED for code stemi. Pt in room crying out in pain, pt's significant other arrived and chaplain offered support. Partner shared that he is the love of her life. Chaplain introduced spiritual care and offered support as well as assistance locating the 2H waiting room, notifying cath lab staff of her presence, and water and a snack. She is aware of continued chaplain availability for further support and stated she would like to step outside for a bit. Expressed gratitude as there is no local family present.  Please page as further needs arise.  Donald Prose. Elyn Peers, M.Div. [facility] Chaplain Pager [phone] Office [phone]     07/01/21 1310  Clinical Encounter Type  Visited With Patient and family together  Visit Type Initial;Critical Care  Spiritual Encounters  Spiritual Needs Emotional  Stress Factors  Family Stress Factors Loss of control

## 2021-07-01 NOTE — ED Provider Notes (Signed)
[facility] EMERGENCY DEPARTMENT Provider Note   CSN: [id number] Arrival date & time: 07/01/21  1306     History Chief Complaint  Patient presents with   Code STEMI    Jacob [name] is a 50 y.o. male.  HPI Patient is 50 year old male with past medical history significant for stroke, lung cancer  Patient is presented to the emergency room today with complaints of midsternal chest pain has been gradually worsening since this morning with tingling in bilateral hands but no numbness or weakness in any extremity  Endorses nausea, shortness of breath, he is also diaphoretic at this time.  Was provided 324 full dose aspirin by EMS given 200 mL of IV fluids he did receive 2 doses of nitroglycerin but blood pressure dropped to less than 956 systolic after second nitroglycerin  Last EMS SBP 108.  Patient denies any radiation to his back.  States that the pain is achy and pressure.  Denies any other associate symptoms.  Denies any history of heart attacks.    Past Medical History:  Diagnosis Date   Lung cancer ([place])    Stroke [facility])    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[place])     There are no problems to display for this patient.   Past Surgical History:  Procedure Laterality Date   TESTICLE SURGERY         No family history on file.  Social History   Tobacco Use   Smoking status: Every Day   Smokeless tobacco: Never  Vaping Use   Vaping Use: Never used  Substance Use Topics   Alcohol use: No    Home Medications Prior to Admission medications   Medication Sig Start Date End Date Taking? Authorizing Provider  azithromycin (ZITHROMAX Z-PAK) 250 MG tablet Take 2 tablets (500 mg) on  Day 1,  followed by 1 tablet (250 mg) once daily on Days 2 through 5. 01/28/19   Laban Emperor, PA-C  oxyCODONE-acetaminophen (PERCOCET) 5-325 MG tablet Take 1-2 tablets by mouth every 4 (four) hours as needed for severe pain. Patient not taking: Reported on 08/30/2018 03/11/16    Johnn Hai, PA-C  sulfamethoxazole-trimethoprim (BACTRIM DS,SEPTRA DS) 800-160 MG tablet Take 1 tablet by mouth 2 (two) times daily. Patient not taking: Reported on 08/30/2018 03/11/16   Johnn Hai, PA-C    Allergies    Patient has no known allergies.  Review of Systems   Review of Systems  Constitutional:  Positive for diaphoresis. Negative for chills and fever.  HENT:  Negative for congestion.   Eyes:  Negative for pain.  Respiratory:  Positive for shortness of breath. Negative for cough.   Cardiovascular:  Positive for chest pain. Negative for leg swelling.  Gastrointestinal:  Positive for nausea. Negative for abdominal pain and vomiting.  Genitourinary:  Negative for dysuria.  Musculoskeletal:  Negative for myalgias.  Skin:  Negative for rash.  Neurological:  Negative for dizziness and headaches.  Psychiatric/Behavioral:  Negative for agitation.    Physical Exam Updated Vital Signs BP (!) 108/40   Pulse 68   Resp (!) 21   Wt 81.6 kg   SpO2 100%   BMI 27.35 kg/m   Physical Exam Vitals and nursing note reviewed.  Constitutional:      General: He is not in acute distress.    Comments: Pale ill-appearing 50 year old male.  Diaphoretic.  Speaking full sentences  HENT:     Head: Normocephalic and atraumatic.     Nose: Nose normal.  Mouth/Throat:     Mouth: Mucous membranes are moist.  Eyes:     General: No scleral icterus. Cardiovascular:     Rate and Rhythm: Normal rate and regular rhythm.     Pulses: Normal pulses.     Heart sounds: Normal heart sounds.     Comments: RRR  Bilateral radial artery pulses 2+ and symmetric Pulmonary:     Effort: Pulmonary effort is normal. No respiratory distress.     Breath sounds: No wheezing.  Abdominal:     Palpations: Abdomen is soft.     Tenderness: There is no abdominal tenderness. There is no guarding or rebound.  Musculoskeletal:     Cervical back: Normal range of motion.     Right lower leg: No edema.      Left lower leg: No edema.  Skin:    General: Skin is warm and dry.     Capillary Refill: Capillary refill takes less than 2 seconds.  Neurological:     Mental Status: He is alert. Mental status is at baseline.  Psychiatric:        Mood and Affect: Mood normal.        Behavior: Behavior normal.    ED Results / Procedures / Treatments   Labs (all labs ordered are listed, but only abnormal results are displayed) Labs Reviewed  RESP PANEL BY RT-PCR (FLU A&B, COVID) ARPGX2  HEPARIN ANTI-XA  HEMOGLOBIN A1C  CBC WITH DIFFERENTIAL/PLATELET  PROTIME-INR  APTT  COMPREHENSIVE METABOLIC PANEL  LIPID PANEL  TROPONIN I (HIGH SENSITIVITY)    EKG None  Radiology No results found.  Procedures .Critical Care Performed by: Tedd Sias, PA Authorized by: Tedd Sias, PA   Critical care provider statement:    Critical care time (minutes):  35   Critical care time was exclusive of:  Separately billable procedures and treating other patients and teaching time   Critical care was necessary to treat or prevent imminent or life-threatening deterioration of the following conditions: STEMI.   Critical care was time spent personally by me on the following activities:  Development of treatment plan with patient or surrogate, review of old charts, re-evaluation of patient's condition, pulse oximetry, ordering and review of radiographic studies, ordering and review of laboratory studies, ordering and performing treatments and interventions, obtaining history from patient or surrogate, examination of patient and evaluation of patient's response to treatment   Care discussed with: admitting provider     Medications Ordered in ED Medications  0.9 %  sodium chloride infusion ( Intravenous New Bag/Given 07/01/21 1318)  ondansetron (ZOFRAN) injection 4 mg ( Intravenous MAR Hold 07/01/21 1333)  fentaNYL (SUBLIMAZE) injection 50 mcg (50 mcg Intravenous Given 07/01/21 1317)  heparin injection 60  Units/kg (4,000 Units Intravenous Given 07/01/21 1314)    ED Course  I have reviewed the triage vital signs and the nursing notes.  Pertinent labs & imaging results that were available during my care of the patient were reviewed by me and considered in my medical decision making (see chart for details).  Clinical Course as of 07/01/21 1335  Tue Jul 01, 2021  1320 50 yo male presenting to E Dwith chest pain as code stemi, received aspirin and SL nitro by EMS< here with nausea, chest pressure.  ECG concerning for inferior/lateral ST elevations.  Cardiologist present on arrival and planning for emergent catheterization. [MT]    Clinical Course User Index [MT] Trifan, Carola Rhine, MD   MDM Rules/Calculators/A&P  Patient is 50 year old male with past medical history detailed in HPI presented to the ER today as a code STEMI brought in by EMS received full dose aspirin already.  Heparin, fentanyl, Zofran ordered by myself.  These were administered by bedside RN and pharmacy who is at bedside.  Dr. Einar Gip of cardiology evaluated patient at bedside and plans to take patient expediently to Cath Lab for evaluation of STEMI.  Precordial lead ST elevation consistent with potentially lateral ischemia.  Patient placed on ZOLL monitor with pads in place.  Broad differential considered including PE, dissection, tamponade.  Given patient's EKG lack of JVD, clearly auscultated heartbeat have low suspicion for tamponade.  Dissection less likely given lack of any asymmetric pulses and EKG findings primarily concerning for STEMI.  PE less likely given the patient is on anticoagulation   Patient taken to Cath Lab by Dr. Einar Gip.  Final Clinical Impression(s) / ED Diagnoses Final diagnoses:  Acute ST elevation myocardial infarction (STEMI), unspecified artery [facility])    Rx / DC Orders ED Discharge Orders     None        Tedd Sias, [place] 07/01/21 1338    Wyvonnia Dusky,  MD 07/01/21 1626

## 2021-07-02 ENCOUNTER — Encounter (HOSPITAL_COMMUNITY): Payer: Self-pay | Admitting: Cardiology

## 2021-07-02 DIAGNOSIS — Z20822 Contact with and (suspected) exposure to covid-19: Secondary | ICD-10-CM | POA: Diagnosis not present

## 2021-07-02 DIAGNOSIS — E7800 Pure hypercholesterolemia, unspecified: Secondary | ICD-10-CM | POA: Diagnosis not present

## 2021-07-02 DIAGNOSIS — F17200 Nicotine dependence, unspecified, uncomplicated: Secondary | ICD-10-CM | POA: Diagnosis not present

## 2021-07-02 DIAGNOSIS — I214 Non-ST elevation (NSTEMI) myocardial infarction: Secondary | ICD-10-CM | POA: Diagnosis not present

## 2021-07-02 LAB — BASIC METABOLIC PANEL
Anion gap: 7 (ref 5–15)
BUN: 16 mg/dL (ref 6–20)
CO2: 26 mmol/L (ref 22–32)
Calcium: 8.5 mg/dL — ABNORMAL LOW (ref 8.9–10.3)
Chloride: 104 mmol/L (ref 98–111)
Creatinine, Ser: 1.21 mg/dL (ref 0.61–1.24)
GFR, Estimated: >60 mL/min (ref >60)
Glucose, Bld: 122 mg/dL — ABNORMAL HIGH (ref 70–99)
Potassium: 4 mmol/L (ref 3.5–5.1)
Sodium: 137 mmol/L (ref 135–145)

## 2021-07-02 LAB — CBC
HCT: 40.6 % (ref 39.0–52.0)
Hemoglobin: 13.6 g/dL (ref 13.0–17.0)
MCH: 30.1 pg (ref 26.0–34.0)
MCHC: 33.5 g/dL (ref 30.0–36.0)
MCV: 89.8 fL (ref 80.0–100.0)
Platelets: 239 10*3/uL (ref 150–400)
RBC: 4.52 MIL/uL (ref 4.22–5.81)
RDW: 14.8 % (ref 11.5–15.5)
WBC: 13 10*3/uL — ABNORMAL HIGH (ref 4.0–10.5)
nRBC: 0 % (ref 0.0–0.2)

## 2021-07-02 LAB — TROPONIN I (HIGH SENSITIVITY): Troponin I (High Sensitivity): 10730 ng/L (ref <18)

## 2021-07-02 LAB — BRAIN NATRIURETIC PEPTIDE: B Natriuretic Peptide: 45.6 pg/mL (ref 0.0–100.0)

## 2021-07-02 LAB — T4, FREE: Free T4: 1.16 ng/dL — ABNORMAL HIGH (ref 0.61–1.12)

## 2021-07-02 LAB — TSH: TSH: 14.888 u[IU]/mL — ABNORMAL HIGH (ref 0.350–4.500)

## 2021-07-02 MED ORDER — NITROGLYCERIN 0.4 MG SL SUBL: 0.4000 mg | SUBLINGUAL_TABLET | SUBLINGUAL | 12 refills | Status: AC | PRN

## 2021-07-02 MED ORDER — TICAGRELOR 90 MG PO TABS: 90.0000 mg | ORAL_TABLET | Freq: Two times a day (BID) | ORAL | 3 refills | Status: DC

## 2021-07-02 MED ORDER — ATORVASTATIN CALCIUM 80 MG PO TABS: 80.0000 mg | ORAL_TABLET | Freq: Every day | ORAL | 3 refills | Status: DC

## 2021-07-02 MED ORDER — METOPROLOL SUCCINATE ER 25 MG PO TB24: 25.0000 mg | ORAL_TABLET | Freq: Every day | ORAL | 3 refills | Status: DC

## 2021-07-02 NOTE — Progress Notes (Signed)
Pt ambulated earlier without c/o. Eager to d/c. Discussed MI, stent, Brilinta importance, smoking cessation, diet, exercise, NTG, and CRPII. Very receptive. He is eager to quit smoking. Will refer to [place]. [place] 12:23 PM 07/02/2021

## 2021-07-02 NOTE — Care Management (Signed)
1312 07-02-21 Late Entry: Case Manager attempted to speak with the patient regarding PCP needs. Case Manager attempted several calls to the patients room and cell number without any answer. Patient had left by the time Case Manager arrived to the room. Patient has Medicaid [employer]- when he enrolled the patient should have received a listing of in-network providers.

## 2021-07-02 NOTE — Discharge Summary (Signed)
Physician Discharge Summary  Patient ID: Jacob [name] MRN: [id number] DOB/AGE: [birth year] 50 y.o. 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  Ao 150/95, mean 118 mmHg.  There was no pressure gradient across the aortic valve. LM: Large vessel. LAD: Thrombotic/ulcerated proximal LAD 80 to 90% stenosis with TIMI II flow.  Apical LAD diffusely diseased.  Direct stenting ONYX FRONTIER 3.5X30 mm DES. Maximum pressure: 16 atm. Inflation time: 45 sec. Stent strut is well apposed.  TIMI II to TIMI-3 flow at the end of the procedure, distal embolization of the apical LAD of no clinical consequence with occlusion.  It is also diffusely diseased at the apex. CX: Very large-caliber vessel.  Mid segment has a 40% stenosis. 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  There are contralateral collaterals noted and ipsilateral collaterals noted to the distal right coronary artery.  The artery is flush occluded after the RV branch, I did try to open up the vessel however unable to obtain access to the true lumen.  However with the attempted angioplasty, contralateral flow improved all the way back to the proximal segment of the RCA from the LAD.  Recommendation: Aggressive medical therapy, he will be brought back probably in 4 to 6 weeks for relook at the right coronary artery which is now type II collaterals from the LAD.  The vessel itself is fairly large and may need repeat angioplasty.  Smoking cessation and risk factor modification is indicated.  240 mL contrast utilized.  Echocardiogram  07/01/2021: 1. Left ventricular ejection fraction, by estimation, is 45 to 50%. The  left ventricle has mildly decreased function. The left ventricle  demonstrates regional wall motion abnormalities (see scoring  diagram/findings for description). Left ventricular  diastolic parameters were normal. There is akinesis of the left  ventricular, apical inferoseptal wall and anteroseptal wall.   2. Right ventricular systolic function is normal. The right ventricular  size is normal.   3. The mitral valve is normal in structure. No evidence of mitral valve regurgitation. No evidence of mitral stenosis.   4. The aortic valve is normal in structure. Aortic valve regurgitation is not visualized. No aortic stenosis is present.  EKG  07/01/2021 at 1316 hrs.: Normal sinus rhythm at rate of 61 bpm, hyperacute T waves, inferior and anterolateral STEMI. 07/02/2021: Sinus rhythm at a rate of 81 bpm.  Normal axis.  ST elevations of normalized.  Radiology:  DG Chest Portable 07/01/2021 The heart size and mediastinal contours are within normal limits. Both lungs are clear. The visualized skeletal structures are unremarkable. Impression: No active disease  Hospital Course: Jacob [name] is a 50 y.o. male Caucasian patient with history of hyperlipidemia, hyperglycemia, active smoker, history of cardioembolic stroke in June 9326. 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 He has hypothyroidism for which he takes Synthroid which has been managed by his oncologist as patient does not have PCP.   Patient presented to the emergency room with 2 to 3 hours of severe retrosternal chest pain with radiation to the arm and neck associated with diaphoresis, nausea and  vomiting. EKG revealed anterolateral STEMI, patient was subsequently taken to cath lab and underwent to the proximal LAD.   Echocardiogram revealed LVEF of 45-50% with apical inferoseptal and anteroseptal akinesis.  Patient was started on medical therapy including beta-blocker, aspirin and Brilinta, and statin therapy.  Patient is notably not on ARB/ACE inhibitor at this time given soft blood pressure.  Patient recovered well with complete normalization of his EKG and resolution of symptoms.  Patient able to ambulate without issue this morning.   Notably during hospitalization patient was TSH is elevated, Synthroid was recently increased by oncologist prior to admission.  Recommendations on discharge:  Recommend aggressive medical therapy, continue beta-blocker, statin, and dual antiplatelet therapy.  Given PCI recommend uninterrupted dual antiplatelet therapy with aspirin and Brilinta for minimum of 12 months.  Consider addition of ARB/ACE inhibitor outpatient as renal function and hemodynamics allow.  Will consider relook cardiac catheterization in 4 to 6 weeks to reevaluate right coronary artery.  We will also plan to repeat echocardiogram on outpatient basis, suspect LVEF may normalize following PCI.  Counseled patient regarding the importance of complete tobacco cessation, patient appears motivated to quit.  Recommend patient follow-up with oncologist for further management of thyroid dysfunction.  Discharge Exam: Vitals with BMI 07/02/2021 07/02/2021 07/02/2021  Height - - -  Weight - - -  BMI - - -  Systolic - 151 761  Diastolic - 78 69  Pulse 73 69 66     Physical Exam Vitals reviewed.  HENT:     Head: Normocephalic and atraumatic.  Cardiovascular:     Rate and Rhythm: Normal rate and regular rhythm.     Pulses: Intact distal pulses.     Heart sounds: S1 normal and S2 normal. No murmur heard.   No gallop.  Pulmonary:     Effort: Pulmonary effort is normal. No respiratory distress.     Breath sounds: No wheezing, rhonchi or rales.  Musculoskeletal:     Right lower leg: No edema.     Left lower  leg: No edema.  Neurological:     Mental Status: He is alert.    Labs:   Lab Results  Component Value Date   WBC 13.0 (H) 07/02/2021   HGB 13.6 07/02/2021   HCT 40.6 07/02/2021   MCV 89.8 07/02/2021   PLT 239 07/02/2021    Recent Labs  Lab 07/01/21 1329 07/01/21 1338 07/02/21 0110  NA 136   < > 137  K 3.8   < > 4.0  CL 109   < > 104  CO2 16*  --  26  BUN 20   < > 16  CREATININE 1.38*   < > 1.21  CALCIUM 8.4*  --  8.5*  PROT 5.4*  --   --   BILITOT 0.2*  --   --   ALKPHOS 105  --   --   ALT 15  --   --   AST 22  --   --   GLUCOSE 118*   < > 122*   < > = values in this interval not displayed.    Lipid Panel     Component Value Date/Time   CHOL 155 07/01/2021 1329   TRIG 82 07/01/2021 1329   HDL 34 (L) 07/01/2021 1329   CHOLHDL 4.6 07/01/2021 1329   VLDL 16 07/01/2021 1329   LDLCALC 105 (H) 07/01/2021 1329    BNP (last 3 results) Recent Labs    07/02/21 0110  BNP 45.6  HEMOGLOBIN A1C Lab Results  Component Value Date   HGBA1C 6.3 (H) 07/01/2021   MPG 134.11 07/01/2021    Cardiac Panel  Troponin 60 - [id number]  TSH Recent Labs    12/24/20 1138 07/02/21 0110  TSH 29.888* 14.888*    FOLLOW UP PLANS AND APPOINTMENTS Discharge Instructions     AMB Referral to Cardiac Rehabilitation - Phase II   Complete by: As directed    Diagnosis: STEMI   After initial evaluation and assessments completed: Virtual Based Care may be provided alone or in conjunction with Phase 2 Cardiac Rehab based on patient barriers.: Yes      Allergies as of 07/02/2021   No Known Allergies      Medication List     STOP taking these medications    azithromycin 250 MG tablet Commonly known as: Zithromax Z-Pak   enoxaparin 80 MG/0.8ML injection Commonly known as: LOVENOX   oxyCODONE-acetaminophen 5-325 MG tablet Commonly known as: Percocet   sulfamethoxazole-trimethoprim 800-160 MG tablet Commonly known as: BACTRIM DS       TAKE these  medications    atorvastatin 80 MG tablet Commonly known as: LIPITOR Take 1 tablet (80 mg total) by mouth daily.   Bayer Low Dose 81 MG chewable tablet Generic drug: aspirin Chew 81 mg by mouth daily.   levothyroxine 125 MCG tablet Commonly known as: SYNTHROID Take 125 mcg by mouth daily before breakfast.   metoprolol succinate 25 MG 24 hr tablet Commonly known as: TOPROL-XL Take 1 tablet (25 mg total) by mouth daily.   nitroGLYCERIN 0.4 MG SL tablet Commonly known as: NITROSTAT Place 1 tablet (0.4 mg total) under the tongue every 5 (five) minutes as needed for chest pain.   Tabrecta 200 MG tablet Generic drug: capmatinib Take 400 mg by mouth in the morning and at bedtime.   ticagrelor 90 MG Tabs tablet Commonly known as: BRILINTA Take 1 tablet (90 mg total) by mouth 2 (two) times daily.        Follow-up Information     Alethia Berthold, PA-C Follow up on 07/14/2021.   Specialty: Cardiology Why: Appointment at 9:15 AM. Please arrive 15 minute priror and bring all medications with you. Contact information: [place] [address] [phone]                Patient was seen in collaboration with Dr. Einar Gip. He also reviewed patient's chart and examined the patient. Dr. Einar Gip is in agreement of the plan.    Alethia Berthold, PA-C 07/02/2021, 10:48 AM Office: [phone]

## 2021-07-03 ENCOUNTER — Emergency Department (HOSPITAL_COMMUNITY)
Admission: EM | Admit: 2021-07-03 | Discharge: 2021-07-04 | Discharge status: Against Medical Advice | Payer: Medicaid Other | Attending: Emergency Medicine | Admitting: Emergency Medicine

## 2021-07-03 ENCOUNTER — Other Ambulatory Visit: Payer: Self-pay

## 2021-07-03 ENCOUNTER — Emergency Department (HOSPITAL_COMMUNITY): Payer: Medicaid Other

## 2021-07-03 DIAGNOSIS — Z20822 Contact with and (suspected) exposure to covid-19: Secondary | ICD-10-CM | POA: Diagnosis not present

## 2021-07-03 DIAGNOSIS — F17200 Nicotine dependence, unspecified, uncomplicated: Secondary | ICD-10-CM | POA: Insufficient documentation

## 2021-07-03 DIAGNOSIS — Z85118 Personal history of other malignant neoplasm of bronchus and lung: Secondary | ICD-10-CM | POA: Insufficient documentation

## 2021-07-03 DIAGNOSIS — I2510 Atherosclerotic heart disease of native coronary artery without angina pectoris: Secondary | ICD-10-CM | POA: Insufficient documentation

## 2021-07-03 DIAGNOSIS — Z8547 Personal history of malignant neoplasm of testis: Secondary | ICD-10-CM | POA: Diagnosis not present

## 2021-07-03 DIAGNOSIS — I259 Chronic ischemic heart disease, unspecified: Secondary | ICD-10-CM | POA: Diagnosis not present

## 2021-07-03 DIAGNOSIS — R079 Chest pain, unspecified: Secondary | ICD-10-CM | POA: Diagnosis present

## 2021-07-03 LAB — CBC
HCT: 42.4 % (ref 39.0–52.0)
Hemoglobin: 14.2 g/dL (ref 13.0–17.0)
MCH: 30.1 pg (ref 26.0–34.0)
MCHC: 33.5 g/dL (ref 30.0–36.0)
MCV: 90 fL (ref 80.0–100.0)
Platelets: 234 10*3/uL (ref 150–400)
RBC: 4.71 MIL/uL (ref 4.22–5.81)
RDW: 14.6 % (ref 11.5–15.5)
WBC: 11.5 10*3/uL — ABNORMAL HIGH (ref 4.0–10.5)
nRBC: 0 % (ref 0.0–0.2)

## 2021-07-03 LAB — LIPASE, BLOOD: Lipase: 34 U/L (ref 11–51)

## 2021-07-03 LAB — TROPONIN I (HIGH SENSITIVITY)
Troponin I (High Sensitivity): 2040 ng/L (ref <18)
Troponin I (High Sensitivity): 2230 ng/L (ref <18)

## 2021-07-03 LAB — BASIC METABOLIC PANEL
Anion gap: 6 (ref 5–15)
BUN: 17 mg/dL (ref 6–20)
CO2: 22 mmol/L (ref 22–32)
Calcium: 8.3 mg/dL — ABNORMAL LOW (ref 8.9–10.3)
Chloride: 107 mmol/L (ref 98–111)
Creatinine, Ser: 1.4 mg/dL — ABNORMAL HIGH (ref 0.61–1.24)
GFR, Estimated: >60 mL/min (ref >60)
Glucose, Bld: 85 mg/dL (ref 70–99)
Potassium: 4 mmol/L (ref 3.5–5.1)
Sodium: 135 mmol/L (ref 135–145)

## 2021-07-03 LAB — T3, FREE: T3, Free: 2.2 pg/mL (ref 2.0–4.4)

## 2021-07-03 LAB — CBG MONITORING, ED: Glucose-Capillary: 92 mg/dL (ref 70–99)

## 2021-07-03 MED ORDER — ACETAMINOPHEN 325 MG PO TABS: 650.0000 mg | ORAL_TABLET | ORAL | Status: DC | PRN

## 2021-07-03 MED ORDER — ASPIRIN 81 MG PO CHEW: 324.0000 mg | CHEWABLE_TABLET | Freq: Once | ORAL | Status: DC

## 2021-07-03 MED ORDER — NITROGLYCERIN 0.4 MG SL SUBL: 0.4000 mg | SUBLINGUAL_TABLET | SUBLINGUAL | Status: DC | PRN

## 2021-07-03 MED ORDER — ONDANSETRON HCL 4 MG/2ML IJ SOLN: 4.0000 mg | Freq: Four times a day (QID) | INTRAMUSCULAR | Status: DC | PRN

## 2021-07-03 MED ORDER — ASPIRIN EC 81 MG PO TBEC: 81.0000 mg | DELAYED_RELEASE_TABLET | Freq: Every day | ORAL | Status: DC

## 2021-07-03 NOTE — ED Provider Notes (Signed)
[facility] EMERGENCY DEPARTMENT Provider Note   CSN: [id number] Arrival date & time: 07/03/21  2011     History Chief Complaint  Patient presents with   Chest Pain    Jacob [name] is a 50 y.o. male.   Chest Pain Associated symptoms: no abdominal pain, no back pain, no cough, no fever, no palpitations, no shortness of breath and no vomiting    50 y.o. male Caucasian patient with history of hyperlipidemia, hyperglycemia, active smoker, history of cardioembolic stroke in June 1914, [place] left testicle SP radiation therapy, stage IV metastatic lung cancer diagnosed in September 2020, excellent response to capmatinib, hypothyroidism, recent STEMI with subsequent intervention via DES to the LAD with residual disease in the right coronary, started on aspirin, Brilinta, statin, beta-blocker, presenting to the emergency department with chest pain.  The patient states that he developed sudden onset left-sided chest pain while at rest while playing videogames earlier today.  Pain was burning, radiating down his arms, no aggravating or alleviating factors.  He states that his pain was burning and radiated down his arms bilaterally and was identical to the pain that brought him to the emergency department a few days ago resulting in his STEMI and intervention.  He took 3 nitroglycerin with no relief and called EMS.  His chest pain did resolve following the third nitroglycerin in route with EMS.  He did take a full-strength aspirin today and took his Brilinta.  Currently denies any chest pain, shortness of breath, nausea, diaphoresis, abdominal discomfort.  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[facility])     Patient Active Problem List   Diagnosis Date Noted   Chest pain of uncertain etiology 78/29/5621   Acute ST elevation myocardial infarction (STEMI) of anterolateral wall ([place]) 07/01/2021   CAD (coronary artery  disease), native coronary artery 07/01/2021    Past Surgical History:  Procedure Laterality Date   LEFT HEART CATH AND CORONARY ANGIOGRAPHY N/A 07/01/2021   Procedure: LEFT HEART CATH AND CORONARY ANGIOGRAPHY;  Surgeon: Adrian Prows, MD;  Location: [facility];  Service: Cardiovascular;  Laterality: N/A;   TESTICLE SURGERY         No family history on file.  Social History   Tobacco Use   Smoking status: Every Day   Smokeless tobacco: Never  Vaping Use   Vaping Use: Never used  Substance Use Topics   Alcohol use: No    Home Medications Prior to Admission medications   Medication Sig Start Date End Date Taking? Authorizing Provider  atorvastatin (LIPITOR) 80 MG tablet Take 1 tablet (80 mg total) by mouth daily. 07/02/21   Cantwell, Celeste C, PA-C  BAYER LOW DOSE 81 MG chewable tablet Chew 81 mg by mouth daily.    [provider]  levothyroxine (SYNTHROID) 125 MCG tablet Take 125 mcg by mouth daily before breakfast.    [provider]  metoprolol succinate (TOPROL-XL) 25 MG 24 hr tablet Take 1 tablet (25 mg total) by mouth daily. 07/02/21   Cantwell, Celeste C, PA-C  nitroGLYCERIN (NITROSTAT) 0.4 MG SL tablet Place 1 tablet (0.4 mg total) under the tongue every 5 (five) minutes as needed for chest pain. 07/02/21   Cantwell, Celeste C, PA-C  TABRECTA 200 MG tablet Take 400 mg by mouth in the morning and at bedtime. 07/01/21   [provider]  ticagrelor (BRILINTA) 90 MG TABS tablet Take 1  tablet (90 mg total) by mouth 2 (two) times daily. 07/02/21   Cantwell, Celeste C, PA-C    Allergies    Patient has no known allergies.  Review of Systems   Review of Systems  Constitutional:  Negative for chills and fever.  HENT:  Negative for ear pain and sore throat.   Eyes:  Negative for pain and visual disturbance.  Respiratory:  Negative for cough and shortness of breath.   Cardiovascular:  Positive for chest pain. Negative for palpitations.   Gastrointestinal:  Negative for abdominal pain and vomiting.  Genitourinary:  Negative for dysuria and hematuria.  Musculoskeletal:  Negative for arthralgias and back pain.  Skin:  Negative for color change and rash.  Neurological:  Negative for seizures and syncope.  All other systems reviewed and are negative.  Physical Exam Updated Vital Signs BP 105/67 (BP Location: Left Arm)   Pulse 80   Temp 98.6 F (37 C) (Oral)   Resp 16   SpO2 96%   Physical Exam Vitals and nursing note reviewed.  Constitutional:      General: He is not in acute distress.    Appearance: He is well-developed.  HENT:     Head: Normocephalic and atraumatic.  Eyes:     Conjunctiva/sclera: Conjunctivae normal.  Neck:     Vascular: No JVD.  Cardiovascular:     Rate and Rhythm: Normal rate and regular rhythm.     Heart sounds: No murmur heard. Pulmonary:     Effort: Pulmonary effort is normal. No respiratory distress.     Breath sounds: Normal breath sounds.  Abdominal:     Palpations: Abdomen is soft.     Tenderness: There is no abdominal tenderness.  Musculoskeletal:        General: No swelling.     Cervical back: Neck supple.     Right lower leg: No edema.     Left lower leg: No edema.  Skin:    General: Skin is warm and dry.     Capillary Refill: Capillary refill takes less than 2 seconds.  Neurological:     Mental Status: He is alert.  Psychiatric:        Mood and Affect: Mood normal.    ED Results / Procedures / Treatments   Labs (all labs ordered are listed, but only abnormal results are displayed) Labs Reviewed  BASIC METABOLIC PANEL - Abnormal; Notable for the following components:      Result Value   Creatinine, Ser 1.40 (*)    Calcium 8.3 (*)    All other components within normal limits  CBC - Abnormal; Notable for the following components:   WBC 11.5 (*)    All other components within normal limits  TROPONIN I (HIGH SENSITIVITY) - Abnormal; Notable for the following  components:   Troponin I (High Sensitivity) 2,040 (*)    All other components within normal limits  RESP PANEL BY RT-PCR (FLU A&B, COVID) ARPGX2  LIPASE, BLOOD  CBG MONITORING, ED  TROPONIN I (HIGH SENSITIVITY)    EKG None  Radiology DG Chest Portable 1 View  Result Date: 07/03/2021 CLINICAL DATA:  Chest pain. EXAM: PORTABLE CHEST 1 VIEW COMPARISON:  Chest x-ray 07/01/2021. FINDINGS: The heart size and mediastinal contours are within normal limits. Both lungs are clear. The visualized skeletal structures are unremarkable. IMPRESSION: No active disease. Electronically Signed   By: Ronney Asters M.D.   On: 07/03/2021 20:36    Procedures Procedures   Medications Ordered in ED  Medications  aspirin EC tablet 81 mg (has no administration in time range)  nitroGLYCERIN (NITROSTAT) SL tablet 0.4 mg (has no administration in time range)  acetaminophen (TYLENOL) tablet 650 mg (has no administration in time range)  ondansetron (ZOFRAN) injection 4 mg (has no administration in time range)    ED Course  I have reviewed the triage vital signs and the nursing notes.  Pertinent labs & imaging results that were available during my care of the patient were reviewed by me and considered in my medical decision making (see chart for details).    MDM Rules/Calculators/A&P                         50 y.o. male Caucasian patient with history of hyperlipidemia, hyperglycemia, active smoker, history of cardioembolic stroke in June 5909, [place] left testicle SP radiation therapy, stage IV metastatic lung cancer diagnosed in September 2020, excellent response to capmatinib, hypothyroidism, recent STEMI with subsequent intervention via DES to the LAD with residual disease in the right coronary, started on aspirin, Brilinta, statin, beta-blocker, presenting to the emergency department with chest pain.  The patient states that he developed sudden onset left-sided chest pain while at rest while playing  videogames earlier today.  Pain was burning, radiating down his arms, no aggravating or alleviating factors.  He states that his pain was burning and radiated down his arms bilaterally and was identical to the pain that brought him to the emergency department a few days ago resulting in his STEMI and intervention.  He took 3 nitroglycerin with no relief and called EMS.  His chest pain did resolve following the third nitroglycerin in route with EMS.  He did take a full-strength aspirin today and took his Brilinta.  Currently denies any chest pain, shortness of breath, nausea, diaphoresis, abdominal discomfort.  On arrival, the patient was afebrile, hemodynamically stable, saturating well on room air.  Physical exam generally unremarkable.  The patient is currently asymptomatic.  Initial EKG revealed some ST segment changes in the inferior leads in lead III, no clear STEMI.  Differential includes ACS, ventricular free wall aneurysm, GERD, stent thrombosis.   Patient chest x-ray without acute cardiac or pulmonary abnormality.  His troponin was elevated to 2040 which is downtrending from his recent inpatient troponins, repeat pending.  I spoke with Dr. Einar Gip of cardiology who plans to admit the patient for observation given his concerning symptoms and presentation earlier today.  Patient was subsequently admitted to the cardiology service in stable condition.   Final Clinical Impression(s) / ED Diagnoses Final diagnoses:  Chest pain due to myocardial ischemia, unspecified ischemic chest pain type    Rx / DC Orders ED Discharge Orders     None        Regan Lemming, MD 07/03/21 2144

## 2021-07-03 NOTE — ED Triage Notes (Signed)
Patient BIB EMS for evaluation of chest pain.  Reports he was seen on Tuesday for same and dx with STEMI.  Has stent placed x 1.  Tonight began having chest pain that "felt like last night."  Was playing video games when pain started.  Took Nitro SL x 3 without relief.  Has has ASA 324 mg PO PTA.  No c/o SHOB.  Chest pain 2/10 at this time

## 2021-07-04 ENCOUNTER — Telehealth: Payer: Self-pay | Admitting: Cardiology

## 2021-07-04 LAB — RESP PANEL BY RT-PCR (FLU A&B, COVID) ARPGX2
Influenza A by PCR: NEGATIVE
Influenza B by PCR: NEGATIVE
SARS Coronavirus 2 by RT PCR: NEGATIVE

## 2021-07-04 LAB — TROPONIN I (HIGH SENSITIVITY): Troponin I (High Sensitivity): 2073 ng/L (ref <18)

## 2021-07-04 MED ORDER — TICAGRELOR 90 MG PO TABS: 90.0000 mg | ORAL_TABLET | Freq: Two times a day (BID) | ORAL | Status: DC

## 2021-07-04 MED ORDER — ATORVASTATIN CALCIUM 40 MG PO TABS: 80.0000 mg | ORAL_TABLET | Freq: Every day | ORAL | Status: DC

## 2021-07-04 MED ORDER — NITROGLYCERIN 0.4 MG SL SUBL: 0.4000 mg | SUBLINGUAL_TABLET | SUBLINGUAL | Status: DC | PRN

## 2021-07-04 MED ORDER — ASPIRIN 81 MG PO CHEW: 81.0000 mg | CHEWABLE_TABLET | Freq: Every day | ORAL | Status: DC

## 2021-07-04 MED ORDER — ALPRAZOLAM 0.25 MG PO TABS: 0.5000 mg | ORAL_TABLET | Freq: Every day | ORAL | Status: DC

## 2021-07-04 MED ORDER — METOPROLOL SUCCINATE ER 25 MG PO TB24: 25.0000 mg | ORAL_TABLET | Freq: Every day | ORAL | Status: DC

## 2021-07-04 MED ORDER — LEVOTHYROXINE SODIUM 25 MCG PO TABS: 125.0000 ug | ORAL_TABLET | Freq: Every day | ORAL | Status: DC

## 2021-07-04 NOTE — ED Notes (Signed)
Pt upset about wait time regarding admission. Spoke to wife and pt regarding process and apologized for misunderstanding and miscommunication. Dr.Ganji made aware of patient's request to leave. Pt and wife ambulatory to exit. Instructed pt to follow up with cardiology and return if pain returns

## 2021-07-04 NOTE — Telephone Encounter (Signed)
Patient's girlfriend Amy says patient was in the ER yesterday having chest pain. She says he recently had a stent put in on Tuesday (12/6) and she would like to speak with either JG or CC about patient's chest pain, the stent, has a few questions. Please call her at [phone].

## 2021-07-04 NOTE — ED Notes (Signed)
Patient updated on plan of care.  Informed of process and updated on status of lab work.  Education about condition provided.  Patient reports no current chest pain.  Comfort measures addressed

## 2021-07-04 NOTE — ED Notes (Signed)
Patient took self off monitors.  Wife to nursing station demanding to speak to charge RN

## 2021-07-04 NOTE — ED Notes (Signed)
Patient wanting to leave.  States if "he is going to be down here all night he would rather go home."  Will paged Cardiology and make aware

## 2021-07-04 NOTE — ED Notes (Signed)
Witnessed Pt walk out of the ED with his Wife. Pt was advised to wait for a room upstairs. Pt declined and walked out.

## 2021-07-04 NOTE — Telephone Encounter (Signed)
Reviewed results of ED evaluation with patient last night.  EKG normal and troponin trending down, reassuring.  Patient's questions were addressed to his satisfaction.

## 2021-07-04 NOTE — ED Notes (Signed)
Patient continues to want to leave.  Pt encouraged to stay due to trends in lab work and recent MI.  Understanding voiced.  Patient and wife informed that it is possible that patient may be in ED waiting for a bed upstairs through the night.  Remains upset.  Requesting a recliner for his wife.  Recliner provided for wife's comfort

## 2021-07-04 NOTE — ED Notes (Signed)
Patient wife out to nursing station.  Wanting to know if 3rd troponin had resulted.  Informed patient and spouse that troponin did result.  Requesting to speak with cardiology at this time

## 2021-07-08 ENCOUNTER — Telehealth (HOSPITAL_COMMUNITY): Payer: Self-pay

## 2021-07-08 ENCOUNTER — Telehealth: Payer: Self-pay | Admitting: Pharmacist

## 2021-07-08 NOTE — Telephone Encounter (Signed)
CARE PLAN ENTRY  07/08/2021 Name: Jacob [name] MRN: [id number] DOB: 05-Jan-1971  Jacob [name] is enrolled in Remote Patient Monitoring/Principle Care Monitoring.  Date of Enrollment: 07/08/21 Supervising physician: Adrian Prows Indication: Coronary Artery Disease  Remote Readings:  New start BP monitor  Next scheduled OV: 07/14/21  Pharmacist Clinical Goal(s):  Over the next 90 days, patient will demonstrate Improved medication adherence as evidenced by medication fill history Over the next 90 days, patient will demonstrate improved understanding of prescribed medications and rationale for usage as evidenced by patient teach back Over the next 90 days, patient will experience decrease in ED visits. ED visits in last 6 months = 4 Over the next 90 days, patient will not experience hospital admission. Hospital Admissions in last 6 months = 1  Interventions: Provider and Inter-disciplinary care team collaboration (see longitudinal plan of care) Comprehensive medication review performed. Discussed plans with patient for ongoing care management follow up and provided patient with direct contact information for care management team Collaboration with provider re: medication management  Patient Self Care Activities:  Self administers medications as prescribed Attends all scheduled provider appointments Performs ADL's independently Performs IADL's independently  No Known Allergies Outpatient Encounter Medications as of 07/08/2021  Medication Sig   atorvastatin (LIPITOR) 80 MG tablet Take 1 tablet (80 mg total) by mouth daily.   BAYER LOW DOSE 81 MG chewable tablet Chew 81 mg by mouth daily.   levothyroxine (SYNTHROID) 125 MCG tablet Take 125 mcg by mouth daily before breakfast.   metoprolol succinate (TOPROL-XL) 25 MG 24 hr tablet Take 1 tablet (25 mg total) by mouth daily.   nitroGLYCERIN (NITROSTAT) 0.4 MG SL tablet Place 1 tablet (0.4 mg total) under the tongue  every 5 (five) minutes as needed for chest pain.   TABRECTA 200 MG tablet Take 400 mg by mouth in the morning and at bedtime.   ticagrelor (BRILINTA) 90 MG TABS tablet Take 1 tablet (90 mg total) by mouth 2 (two) times daily.   No facility-administered encounter medications on file as of 07/08/2021.    Coronary Artery Disease    BP goal is:  <130/80  Office blood pressures are  BP Readings from Last 3 Encounters:  07/04/21 127/74  07/02/21 104/84  02/25/21 113/77    Patient is currently controlled on the following medications: Atorvastatin 80 mg, metoprolol 25 mg, ASA 81 mg, Ticagrelor 90 mg BID  Patient checks BP at home daily  We discussed diet and exercise extensively, smoking cessation, and medication adherence and compliance  Plan  Continue current medications and control with diet and exercise   Reviewed and discussed recent hospital and ER visit discharge summary with pt. Pt notes that his complains of chest pain complains have improved significantly. Hasnt had to use any of his NTG tablets since. Reviewed PRN NTG usage directions and pt verbalized understanding. Reviewed and updated pt's medication list. Reviewed indication, dosing schedule, and side effects to monitor for pt. Pt reports that he has been tolerating his medications without any noted ADRs. Notes to have mild episodes of lightheadedness. Denies any recent syncope episodes or worsening dizziness symptoms. Provided pt with BP monitor for pt to monitor his home BP readings. Reviewed holding his metoprolol if SBP <100 or HR <60. Reviewed diet and lifestyle change recommendation as well. Reviewed heart healthy diet and healthy eating plate model with pt. Reviewed also activity level goal of ~150 mins/day. Pt reports that he is trying to get into cardiac rehab that  is closer to his home currently. Waiting for his referral to be processed. Recommend pt start walking his backyard in the meantime with his family in the  meantime if physically able. Reviewed smoking cessation with pt as well. Pt notes that he was previously smoking 1 PPD. Since recent hospitalization, has been working on smoking cessation. Reports maybe have 1-2 puffs at most currently over the past week. Currently not using any smoking cessation agents. Per pt, pt continues to have smoking urges, but is able to practice self-restrain. Offered pt using nicorette gums to help if needed. Pt not interested in NRT at the moment. Recommended pt using carrot sticks to help with the oral fixation urges.   Reviewed with pt about having a PCP since pt is planing on being in [place] for the long-term. Recently moved from [place]. Pt notes that he had already reached out to his insurance to help coordinate getting a referral to a PCP office that is within his network.    ______________ Visit Information SDOH (Social Determinants of Health) assessments performed: Yes.  Mr. Vanderweele was given information about Principle Care Management/Remote Patient Monitoring services today including:  RPM/PCM service includes personalized support from designated clinical staff supervised by his physician, including individualized plan of care and coordination with other care providers 24/7 contact phone numbers for assistance for urgent and routine care needs. Standard insurance, coinsurance, copays and deductibles apply for principle care management only during months in which we provide at least 30 minutes of these services. Most insurances cover these services at 100%, however patients may be responsible for any copay, coinsurance and/or deductible if applicable. This service may help you avoid the need for more expensive face-to-face services. Only one practitioner may furnish and bill the service in a calendar month. The patient may stop PCM/RPM services at any time (effective at the end of the month) by phone call to the office staff.  Patient agreed to services and  verbal consent obtained.   Manuela Schwartz, Pharm.D. [facility] [phone] Ext: [phone]

## 2021-07-08 NOTE — Telephone Encounter (Signed)
Pt insurance is active through [place]. Ref# [id number]   Will fax over [facility] Reimbursement form to Dr. Einar Gip   Will contact pt to see if he is interested in the Cardiac Rehab program. If interested, pt will need to complete f/u appt on. Once completed, pt will be contacted for scheduling upon review by the RN Navigator.

## 2021-07-08 NOTE — Telephone Encounter (Signed)
Called patient to see if he is interested in the Cardiac Rehab Program. Patient expressed interest. Explained scheduling process and went over insurance process, patient verbalized understanding. Will contact patient for scheduling once f/u has been completed.

## 2021-07-14 ENCOUNTER — Other Ambulatory Visit: Payer: Self-pay

## 2021-07-14 ENCOUNTER — Ambulatory Visit: Payer: Medicaid Other | Admitting: Student

## 2021-07-14 ENCOUNTER — Encounter: Payer: Self-pay | Admitting: Student

## 2021-07-14 VITALS — BP 116/56 | HR 93 | Temp 97.9°F | Ht 68.0 in | Wt 182.0 lb

## 2021-07-14 DIAGNOSIS — I2510 Atherosclerotic heart disease of native coronary artery without angina pectoris: Secondary | ICD-10-CM

## 2021-07-14 DIAGNOSIS — Z85118 Personal history of other malignant neoplasm of bronchus and lung: Secondary | ICD-10-CM

## 2021-07-14 DIAGNOSIS — F17210 Nicotine dependence, cigarettes, uncomplicated: Secondary | ICD-10-CM

## 2021-07-14 DIAGNOSIS — I252 Old myocardial infarction: Secondary | ICD-10-CM

## 2021-07-14 DIAGNOSIS — Z8673 Personal history of transient ischemic attack (TIA), and cerebral infarction without residual deficits: Secondary | ICD-10-CM

## 2021-07-14 NOTE — Progress Notes (Signed)
Primary Physician/Referring:  Patient, No Pcp Per (Inactive)  Patient ID: Jacob [name], male    DOB: Jan 03, 1971, 50 y.o.   MRN: [id number]  Chief Complaint  Patient presents with   Acute ST elevation myocardial infarction   HPI:    Jacob [name]  is a 50 y.o. male Caucasian patient with history of hyperlipidemia, hyperglycemia, active smoker, history of cardioembolic stroke in June 4975. 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 He has hypothyroidism for which he takes Synthroid which has been managed by his oncologist.  History of anterolateral STEMI/2022 with subsequent stenting to proximal LAD and LVEF 45-50% at that time.   Patient presents for hospital follow up.  Patient is feeling well overall since discharge.  He does continue to have mild fatigue and dyspnea on exertion.  He is presently walking 10 minutes/day without issue.  Prior to hospitalization patient smoked approximately a pack per day.  He is now smoking 3 to 4 cigarettes/day.  He is also made significant dietary changes, cutting out fried foods and reducing red meat intake.  Denies chest pain, palpitations, syncope, near syncope.  Denies orthopnea, PND, leg edema.  His blood pressure does remain soft, which is why he is not on ARB/ACE at discharge.  Discharge summery 07/02/2021:  Hospital Course: Jacob [name] is a 50 y.o. male Caucasian patient with history of hyperlipidemia, hyperglycemia, active smoker, history of cardioembolic stroke in June 3005. 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 He has hypothyroidism for which he takes Synthroid which has been managed by his oncologist as patient does not have PCP.    Patient presented to the emergency room with 2  to 3 hours of severe retrosternal chest pain with radiation to the arm and neck associated with diaphoresis, nausea and vomiting. EKG revealed anterolateral STEMI, patient was subsequently taken to cath lab and underwent to the proximal LAD.  Echocardiogram revealed LVEF of 45-50% with apical inferoseptal and anteroseptal akinesis.  Patient was started on medical therapy including beta-blocker, aspirin and Brilinta, and statin therapy.  Patient is notably not on ARB/ACE inhibitor at this time given soft blood pressure.   Patient recovered well with complete normalization of his EKG and resolution of symptoms.  Patient able to ambulate without issue this morning.    Notably during hospitalization patient was TSH is elevated, Synthroid was recently increased by oncologist prior to admission.   Recommendations on discharge:  Recommend aggressive medical therapy, continue beta-blocker, statin, and dual antiplatelet therapy.  Given PCI recommend uninterrupted dual antiplatelet therapy with aspirin and Brilinta for minimum of 12 months.  Consider addition of ARB/ACE inhibitor outpatient as renal function and hemodynamics allow.   Will consider relook cardiac catheterization in 4 to 6 weeks to reevaluate right coronary artery.  We will also plan to repeat echocardiogram on outpatient basis, suspect LVEF may normalize following PCI.   Counseled patient regarding the importance of complete tobacco cessation, patient appears motivated to quit.   Recommend patient follow-up with oncologist for further management of thyroid dysfunction.  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  Social History   Tobacco Use   Smoking status: Every Day   Smokeless tobacco: Never  Substance Use Topics   Alcohol use: No   Marital Status: Single   ROS  Review of Systems  Constitutional: Positive for malaise/fatigue. Negative for weight gain.  Cardiovascular:  Positive for dyspnea on exertion (mild). Negative for chest pain, claudication, leg swelling, near-syncope, orthopnea, palpitations, paroxysmal nocturnal dyspnea and syncope.  Neurological:  Negative for dizziness.   Objective  Blood pressure (!) 116/56, pulse 93, temperature 97.9 F (36.6 C), height 5' 8"  (1.727 m), weight 182 lb (82.6 kg), SpO2 98 %.  Vitals with BMI 07/14/2021 07/04/2021 07/04/2021  Height 5' 8"  - -  Weight 182 lbs - -  BMI 09.98 - -  Systolic 338 250 539  Diastolic 56 74 84  Pulse 93 74 67      Physical Exam Vitals reviewed.  HENT:     Head: Normocephalic and atraumatic.  Cardiovascular:     Rate and Rhythm: Normal rate and regular rhythm.     Pulses: Intact distal pulses.          Radial pulses are 2+ on the right side and 2+ on the left side.     Heart sounds: S1 normal and S2 normal. No murmur heard.   No gallop.     Comments: Radial access site healing well with mild surrounding ecchymosis.  No evidence of hematoma or bruit. Pulmonary:     Effort: Pulmonary effort is normal. No respiratory distress.     Breath sounds: No wheezing, rhonchi or rales.  Musculoskeletal:     Right lower leg: No edema.     Left lower leg: No edema.  Neurological:     Mental Status: He is alert.    Laboratory examination:   Recent Labs    07/01/21 1329 07/01/21 1338 07/02/21 0110 07/03/21 2014  NA 136 141 137 135  K 3.8 3.7 4.0 4.0  CL 109 109 104 107  CO2 16*  --  26 22  GLUCOSE 118* 121* 122* 85  BUN 20 19 16 17   CREATININE 1.38* 1.30* 1.21 1.40*  CALCIUM 8.4*  --  8.5* 8.3*  GFRNONAA >60  --  >60 >60   estimated creatinine clearance is  65.4 mL/min (A) (by C-G formula based on SCr of 1.4 mg/dL (H)).  CMP Latest Ref Rng & Units 07/03/2021 07/02/2021 07/01/2021  Glucose 70 - 99 mg/dL 85 122(H) 121(H)  BUN 6 - 20 mg/dL 17 16 19   Creatinine 0.61 - 1.24 mg/dL 1.40(H) 1.21 1.30(H)  Sodium 135 - 145 mmol/L 135 137 141  Potassium 3.5 - 5.1 mmol/L 4.0 4.0 3.7  Chloride 98 - 111 mmol/L 107 104 109  CO2 22 - 32 mmol/L 22 26 -  Calcium 8.9 - 10.3 mg/dL 8.3(L) 8.5(L) -  Total Protein 6.5 - 8.1 g/dL - - -  Total Bilirubin 0.3 - 1.2 mg/dL - - -  Alkaline Phos 38 - 126 U/L - - -  AST 15 - 41 U/L - - -  ALT 0 - 44 U/L - - -   CBC Latest Ref Rng & Units 07/03/2021 07/02/2021 07/01/2021  WBC 4.0 - 10.5 K/uL 11.5(H) 13.0(H) -  Hemoglobin 13.0 - 17.0 g/dL 14.2 13.6 13.3  Hematocrit 39.0 - 52.0 % 42.4 40.6 39.0  Platelets 150 - 400 K/uL 234 239 -    Lipid Panel Recent Labs    07/01/21 1329  CHOL 155  TRIG 82  LDLCALC 105*  VLDL 16  HDL 34*  CHOLHDL 4.6    HEMOGLOBIN A1C Lab Results  Component Value Date   HGBA1C 6.3 (H) 07/01/2021   MPG 134.11 07/01/2021   TSH Recent Labs    12/24/20 1138 07/02/21 0110  TSH 29.888* 14.888*    External labs:   None   Allergies  No Known Allergies   Medications Prior to Visit:   Outpatient Medications Prior to Visit  Medication Sig Dispense Refill   atorvastatin (LIPITOR) 80 MG tablet Take 1 tablet (80 mg total) by mouth daily. 30 tablet 3   BAYER LOW DOSE 81 MG chewable tablet Chew 81 mg by mouth daily.     levothyroxine (SYNTHROID) 125 MCG tablet Take 125 mcg by mouth daily before breakfast.     metoprolol succinate (TOPROL-XL) 25 MG 24 hr tablet Take 1 tablet (25 mg total) by mouth daily. 30 tablet 3   nitroGLYCERIN (NITROSTAT) 0.4 MG SL tablet Place 1 tablet (0.4 mg total) under the tongue every 5 (five) minutes as needed for chest pain. 30 tablet 12   TABRECTA 200 MG tablet Take 400 mg by mouth in the morning and at bedtime.     ticagrelor (BRILINTA) 90 MG TABS tablet Take 1  tablet (90 mg total) by mouth 2 (two) times daily. 60 tablet 3   No facility-administered medications prior to visit.   Final Medications at End of Visit    Current Meds  Medication Sig   atorvastatin (LIPITOR) 80 MG tablet Take 1 tablet (80 mg total) by mouth daily.   BAYER LOW DOSE 81 MG chewable tablet Chew 81 mg by mouth daily.   levothyroxine (SYNTHROID) 125 MCG tablet Take 125 mcg by mouth daily before breakfast.   metoprolol succinate (TOPROL-XL) 25 MG 24 hr tablet Take 1 tablet (25 mg total) by mouth daily.   nitroGLYCERIN (NITROSTAT) 0.4 MG SL tablet Place 1 tablet (0.4 mg total) under the tongue every 5 (five) minutes as needed for chest pain.   TABRECTA 200 MG tablet Take 400 mg by mouth in the morning and at bedtime.   ticagrelor (BRILINTA) 90 MG TABS tablet Take 1 tablet (90 mg total) by mouth 2 (two) times daily.   Radiology:   No results found.  DG Chest Portable 07/01/2021 The heart size and mediastinal contours are within normal limits. Both lungs are clear. The visualized skeletal structures are unremarkable. Impression: No active disease  Cardiac Studies:   Left Heart Catheterization 07/01/21:  LV: 141/21, EDP 36 mmHg.  Ao 150/95, mean 118 mmHg.  There was no pressure gradient across the aortic valve. LM: Large vessel. LAD: Thrombotic/ulcerated proximal LAD 80 to 90% stenosis with TIMI II flow.  Apical LAD diffusely diseased.  Direct stenting ONYX FRONTIER 3.5X30 mm DES. Maximum pressure: 16 atm. Inflation time: 45 sec. Stent strut is well apposed.  TIMI II to TIMI-3 flow at the end of the procedure, distal embolization of the apical LAD of no clinical consequence with occlusion.  It is also diffusely diseased at the apex. CX: Very large-caliber vessel.  Mid segment has a 40% stenosis. 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  There are contralateral collaterals noted and ipsilateral collaterals noted to the distal right coronary  artery.  The artery is flush occluded after the RV branch, I did try to open up the vessel however unable to obtain access to the true lumen.  However with the attempted angioplasty, contralateral flow improved all the way back to the proximal segment of the RCA from the LAD.  Recommendation: Aggressive medical therapy, he will be brought back probably in 4 to 6 weeks for relook at the right coronary artery which is now type II collaterals from the LAD.  The vessel itself is fairly large and may need repeat angioplasty.  Smoking cessation and risk factor modification is indicated.  240 mL contrast utilized.   Echocardiogram 07/01/2021: 1. Left ventricular ejection fraction, by estimation, is 45 to 50%. The  left ventricle has mildly decreased function. The left ventricle  demonstrates regional wall motion abnormalities (see scoring  diagram/findings for description). Left ventricular  diastolic parameters were normal. There is akinesis of the left  ventricular, apical inferoseptal wall and anteroseptal wall.   2. Right ventricular systolic function is normal. The right ventricular  size is normal.   3. The mitral valve is normal in structure. No evidence of mitral valve regurgitation. No evidence of mitral stenosis.   4. The aortic valve is normal in structure. Aortic valve regurgitation is not visualized. No aortic stenosis is present.  EKG:   07/14/2021: Sinus rhythm at a rate of 74 bpm.  Normal axis.  No evidence of ischemia or underlying injury pattern.  07/02/2021: Sinus rhythm at a rate of 81 bpm.  Normal axis.  ST elevations of normalized.  07/01/2021 at 1316 hrs.: Normal sinus rhythm at rate of 61 bpm, hyperacute T waves, inferior and anterolateral STEMI.  Assessment     ICD-10-CM   1. Coronary artery disease involving native coronary artery of native heart without angina pectoris  A56.97 Basic metabolic panel    CBC    2. History of ST elevation myocardial infarction (STEMI)   I25.2 EKG 12-Lead    3. Nicotine dependence, cigarettes, uncomplicated  X48.016     4. History of stroke  Z86.73     5. History of lung cancer  Z85.118        There are no discontinued medications.  No orders of the defined types were placed in this encounter.   Recommendations:   Jacob [name] is a 50 y.o. male Caucasian patient with history of hyperlipidemia, hyperglycemia, active smoker, history of cardioembolic stroke in June 5537. 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 He has hypothyroidism for which he takes Synthroid which has been managed by his oncologist.  History of anterolateral STEMI/2022 with subsequent stenting to proximal LAD and LVEF 45-50% at that time.   Patient is recovering well from recent STEMI, EKG has normalized.  He is encouraged to continue to increase physical activity and follow-up with cardiac rehab.  Reviewed and discussed at length results of echocardiogram and cardiac catheterization.  Patient is agreeable to relook cath in 4 to 6 weeks with Dr. Einar Gip to reevaluate RCA.  The left heart catheterization procedure was explained to the patient in detail. The indication, alternatives, risks and benefits were reviewed. Complications including but not limited to bleeding, infection, acute kidney injury, blood transfusion, heart rhythm disturbances, contrast (dye) reaction, damage to the arteries or nerves in the legs or hands, cerebrovascular accident, myocardial infarction, need for emergent bypass surgery, blood clots in the legs, possible need for  emergent blood transfusion, and rarely death were reviewed and discussed with the patient. The patient voices understanding and wishes to proceed.  We will plan to repeat echocardiogram following repeat cardiac catheterization, suspect LVEF Will normalize following PCI.  No other changes made  at today's office visit.  We will continue to hold off on addition of ARB/ACE inhibitor given soft blood pressure.  Patient is enrolled in remote patient monitoring with our office, will continue to follow.  5 minutes of today's office visit were spent counseling patient regarding tobacco cessation.  Follow-up after repeat catheterization.   Alethia Berthold, PA-C 07/14/2021, 11:22 AM Office: [phone]

## 2021-07-24 ENCOUNTER — Ambulatory Visit: Payer: Medicaid Other | Admitting: Student

## 2021-07-25 ENCOUNTER — Other Ambulatory Visit: Payer: Self-pay

## 2021-07-25 ENCOUNTER — Ambulatory Visit: Payer: Medicaid Other | Admitting: Cardiology

## 2021-07-25 ENCOUNTER — Encounter: Payer: Self-pay | Admitting: Cardiology

## 2021-07-25 VITALS — BP 115/76 | HR 79 | Temp 98.2°F | Resp 17 | Ht 68.0 in | Wt 182.0 lb

## 2021-07-25 DIAGNOSIS — F17200 Nicotine dependence, unspecified, uncomplicated: Secondary | ICD-10-CM

## 2021-07-25 DIAGNOSIS — I25118 Atherosclerotic heart disease of native coronary artery with other forms of angina pectoris: Secondary | ICD-10-CM

## 2021-07-25 DIAGNOSIS — E7800 Pure hypercholesterolemia, unspecified: Secondary | ICD-10-CM

## 2021-07-25 NOTE — Progress Notes (Signed)
Primary Physician/Referring:  Patient, No Pcp Per (Inactive)  Patient ID: Jacob [name], male    DOB: Feb 10, 1971, 50 y.o.   MRN: [id number]  Chief Complaint  Patient presents with   Coronary artery disease involving native coronary artery of   SURGICAL QUESTIONS   HPI:    Jacob [name]  is a 50 y.o. [place] male patient with history of hyperlipidemia, hyperglycemia, active smoker, history of cardioembolic stroke in June 9937. 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 He has hypothyroidism for which he takes Synthroid which has been managed by his oncologist.  History of anterolateral STEMI/2022 with subsequent stenting to proximal LAD and LVEF 45-50% at that time.  Patient has an occluded anomalous RCA, he was scheduled for elective PCI for the same but wanted to further discuss.  He has not had further angina. Wants to start cardiac rehab.  Denies chest pain, palpitations, syncope, near syncope.  Denies orthopnea, PND, leg edema.  His blood pressure does remain soft, which is why he is not on ARB/ACE at discharge.  Past Medical History:  Diagnosis Date   Lung cancer ([place])    Stroke ([place])    Testicle cancer [facility])    Past Surgical History:  Procedure Laterality Date   LEFT HEART CATH AND CORONARY ANGIOGRAPHY N/A 07/01/2021   Procedure: LEFT HEART CATH AND CORONARY ANGIOGRAPHY;  Surgeon: Adrian Prows, MD;  Location: [place] [facility];  Service: Cardiovascular;  Laterality: N/A;   TESTICLE SURGERY     Family History  Problem Relation Age of Onset   Heart disease Mother    Diabetes Mother     Social History   Tobacco Use   Smoking status: Every Day    Packs/day: 0.25    Years: 40.00    Pack years: 10.00    Types: Cigarettes   Smokeless tobacco: Never  Substance Use Topics   Alcohol use: No   Marital Status: Single   ROS  Review of  Systems  Constitutional: Positive for malaise/fatigue. Negative for weight gain.  Cardiovascular:  Positive for dyspnea on exertion (mild). Negative for chest pain, claudication, leg swelling, near-syncope, orthopnea, palpitations, paroxysmal nocturnal dyspnea and syncope.  Neurological:  Negative for dizziness.   Objective  Blood pressure 115/76, pulse 79, temperature 98.2 F (36.8 C), temperature source Temporal, resp. rate 17, height 5' 8"  (1.727 m), weight 182 lb (82.6 kg), SpO2 96 %.  Vitals with BMI 07/25/2021 07/14/2021 07/04/2021  Height 5' 8"  5' 8"  -  Weight 182 lbs 182 lbs -  BMI 16.96 78.93 -  Systolic 810 175 102  Diastolic 76 56 74  Pulse 79 93 74      Physical Exam Vitals reviewed.  HENT:     Head: Normocephalic and atraumatic.  Cardiovascular:     Rate and Rhythm: Normal rate and regular rhythm.     Pulses: Intact distal pulses.          Radial pulses are 2+ on the right side and 2+ on the left side.     Heart sounds: S1 normal and S2 normal. No murmur heard.   No gallop.     Comments: Radial access site healing well with mild surrounding ecchymosis.  No evidence of hematoma or bruit. Pulmonary:     Effort: Pulmonary effort is normal. No respiratory distress.     Breath sounds: No wheezing, rhonchi or rales.  Musculoskeletal:  Right lower leg: No edema.     Left lower leg: No edema.  Neurological:     Mental Status: He is alert.    Laboratory examination:   Recent Labs    07/01/21 1329 07/01/21 1338 07/02/21 0110 07/03/21 2014  NA 136 141 137 135  K 3.8 3.7 4.0 4.0  CL 109 109 104 107  CO2 16*  --  26 22  GLUCOSE 118* 121* 122* 85  BUN 20 19 16 17   CREATININE 1.38* 1.30* 1.21 1.40*  CALCIUM 8.4*  --  8.5* 8.3*  GFRNONAA >60  --  >60 >60   CrCl cannot be calculated (Patient's most recent lab result is older than the maximum 21 days allowed.).  CMP Latest Ref Rng & Units 07/03/2021 07/02/2021 07/01/2021  Glucose 70 - 99 mg/dL 85 122(H) 121(H)   BUN 6 - 20 mg/dL 17 16 19   Creatinine 0.61 - 1.24 mg/dL 1.40(H) 1.21 1.30(H)  Sodium 135 - 145 mmol/L 135 137 141  Potassium 3.5 - 5.1 mmol/L 4.0 4.0 3.7  Chloride 98 - 111 mmol/L 107 104 109  CO2 22 - 32 mmol/L 22 26 -  Calcium 8.9 - 10.3 mg/dL 8.3(L) 8.5(L) -  Total Protein 6.5 - 8.1 g/dL - - -  Total Bilirubin 0.3 - 1.2 mg/dL - - -  Alkaline Phos 38 - 126 U/L - - -  AST 15 - 41 U/L - - -  ALT 0 - 44 U/L - - -   CBC Latest Ref Rng & Units 07/03/2021 07/02/2021 07/01/2021  WBC 4.0 - 10.5 K/uL 11.5(H) 13.0(H) -  Hemoglobin 13.0 - 17.0 g/dL 14.2 13.6 13.3  Hematocrit 39.0 - 52.0 % 42.4 40.6 39.0  Platelets 150 - 400 K/uL 234 239 -    Lipid Panel Recent Labs    07/01/21 1329  CHOL 155  TRIG 82  LDLCALC 105*  VLDL 16  HDL 34*  CHOLHDL 4.6    HEMOGLOBIN A1C Lab Results  Component Value Date   HGBA1C 6.3 (H) 07/01/2021   MPG 134.11 07/01/2021   TSH Recent Labs    12/24/20 1138 07/02/21 0110  TSH 29.888* 14.888*    External labs:   None   Allergies  No Known Allergies   Medications Prior to Visit:   Outpatient Medications Prior to Visit  Medication Sig Dispense Refill   atorvastatin (LIPITOR) 80 MG tablet Take 1 tablet (80 mg total) by mouth daily. 30 tablet 3   BAYER LOW DOSE 81 MG chewable tablet Chew 81 mg by mouth daily.     levothyroxine (SYNTHROID) 125 MCG tablet Take 125 mcg by mouth daily before breakfast.     metoprolol succinate (TOPROL-XL) 25 MG 24 hr tablet Take 1 tablet (25 mg total) by mouth daily. 30 tablet 3   nitroGLYCERIN (NITROSTAT) 0.4 MG SL tablet Place 1 tablet (0.4 mg total) under the tongue every 5 (five) minutes as needed for chest pain. 30 tablet 12   TABRECTA 200 MG tablet Take 400 mg by mouth in the morning and at bedtime.     ticagrelor (BRILINTA) 90 MG TABS tablet Take 1 tablet (90 mg total) by mouth 2 (two) times daily. 60 tablet 3   No facility-administered medications prior to visit.   Final Medications at End of Visit     Current Meds  Medication Sig   atorvastatin (LIPITOR) 80 MG tablet Take 1 tablet (80 mg total) by mouth daily.   BAYER LOW DOSE 81 MG chewable tablet Chew  81 mg by mouth daily.   levothyroxine (SYNTHROID) 125 MCG tablet Take 125 mcg by mouth daily before breakfast.   losartan (COZAAR) 25 MG tablet Take 0.5 tablets (12.5 mg total) by mouth daily after supper.   metoprolol succinate (TOPROL-XL) 25 MG 24 hr tablet Take 1 tablet (25 mg total) by mouth daily.   nitroGLYCERIN (NITROSTAT) 0.4 MG SL tablet Place 1 tablet (0.4 mg total) under the tongue every 5 (five) minutes as needed for chest pain.   TABRECTA 200 MG tablet Take 400 mg by mouth in the morning and at bedtime.   ticagrelor (BRILINTA) 90 MG TABS tablet Take 1 tablet (90 mg total) by mouth 2 (two) times daily.   Radiology:   No results found.  DG Chest Portable 07/01/2021 The heart size and mediastinal contours are within normal limits. Both lungs are clear. The visualized skeletal structures are unremarkable. Impression: No active disease  Cardiac Studies:   Left Heart Catheterization 07/01/21:  LV: 141/21, EDP 36 mmHg.  Ao 150/95, mean 118 mmHg.  There was no pressure gradient across the aortic valve. LM: Large vessel. LAD: Thrombotic/ulcerated proximal LAD 80 to 90% stenosis with TIMI II flow.  Apical LAD diffusely diseased.  Direct stenting ONYX FRONTIER 3.5X30 mm DES. Maximum pressure: 16 atm. Inflation time: 45 sec. Stent strut is well apposed.  TIMI II to TIMI-3 flow at the end of the procedure, distal embolization of the apical LAD of no clinical consequence with occlusion.  It is also diffusely diseased at the apex. CX: Very large-caliber vessel.  Mid segment has a 40% stenosis. 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  There are contralateral collaterals noted and ipsilateral collaterals noted to the distal right coronary artery.  The artery is flush occluded after the RV branch, I  did try to open up the vessel however unable to obtain access to the true lumen.  However with the attempted angioplasty, contralateral flow improved all the way back to the proximal segment of the RCA from the LAD. Small to moderate sized RCA and severely diffusely diseased.   Echocardiogram 07/01/2021: 1. Left ventricular ejection fraction, by estimation, is 45 to 50%. The  left ventricle has mildly decreased function. The left ventricle  demonstrates regional wall motion abnormalities (see scoring  diagram/findings for description). Left ventricular  diastolic parameters were normal. There is akinesis of the left  ventricular, apical inferoseptal wall and anteroseptal wall.   2. Right ventricular systolic function is normal. The right ventricular  size is normal.   3. The mitral valve is normal in structure. No evidence of mitral valve regurgitation. No evidence of mitral stenosis.   4. The aortic valve is normal in structure. Aortic valve regurgitation is not visualized. No aortic stenosis is present.  EKG:   07/14/2021: Sinus rhythm at a rate of 74 bpm.  Normal axis.  No evidence of ischemia or underlying injury pattern.  07/02/2021: Sinus rhythm at a rate of 81 bpm.  Normal axis.  ST elevations of normalized.  07/01/2021 at 1316 hrs.: Normal sinus rhythm at rate of 61 bpm, hyperacute T waves, inferior and anterolateral STEMI.  Assessment     ICD-10-CM   1. Coronary artery disease of native artery of native heart with stable angina pectoris (HCC)  I25.118 AMB referral to cardiac rehabilitation    Lipid Panel With LDL/HDL Ratio    CBC    CMP14+EGFR    High sensitivity CRP    losartan (COZAAR)  25 MG tablet    2. Pure hypercholesterolemia  E78.00     3. Tobacco use disorder  F17.200       There are no discontinued medications.  Meds ordered this encounter  Medications   losartan (COZAAR) 25 MG tablet    Sig: Take 0.5 tablets (12.5 mg total) by mouth daily after supper.     Dispense:  90 tablet    Refill:  3   Recommendations:   Jacob [name] is a 50 y.o. Caucasian male patient with history of hyperlipidemia, hyperglycemia, active smoker, history of cardioembolic stroke in June 3174. 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 He has hypothyroidism for which he takes Synthroid which has been managed by his oncologist.  History of anterolateral STEMI/2022 with subsequent stenting to proximal LAD and LVEF 45-50% at that time.  Patient has an occluded anomalous RCA, he was scheduled for elective PCI for the same but wanted to further discuss.  He has not had further angina. Wants to start cardiac rehab.  I reviewed the angiograms again, during the process of STEMI, I felt that the right coronary artery was fairly large enough to repeat PCI at a later date however, on review of the angiogram, the right coronary artery is very small and diffusely diseased and is chronically occluded and chances of successful PCI is very low.  In view of this I have recommended continued medical therapy.  Patient does have mid circumflex stenosis of 40 to 50%, aggressive risk management including LDL goal of 55, complete abstinence from tobacco which he has reduced from 1 pack of cigarettes a day to 1/2 pack a day.  He has no history of hypertension but in view of CAD, will add losartan 25 mg 1/2 tablet in the evening.  Discussed in detail with the patient and his wife at the bedside.  Continue dual antiplatelet therapy for a period of 1 year, check lipids and CRP now that he is at least 4 weeks close to his ACS.  We will evaluate him for Zeus trial.   I will see him back in 6 months or sooner if problems. 40 minutes of face to face encounter. Referral to cardiac rehab made.    Adrian Prows, PA-C 07/26/2021, 7:50 AM Office: [phone]

## 2021-07-26 ENCOUNTER — Encounter: Payer: Self-pay | Admitting: Cardiology

## 2021-07-26 MED ORDER — LOSARTAN POTASSIUM 25 MG PO TABS: 12.5000 mg | ORAL_TABLET | Freq: Every day | ORAL | 3 refills | Status: DC

## 2021-08-05 ENCOUNTER — Encounter (HOSPITAL_COMMUNITY): Payer: Self-pay

## 2021-08-05 ENCOUNTER — Ambulatory Visit (HOSPITAL_COMMUNITY): Admit: 2021-08-05 | Payer: Medicaid Other | Admitting: Cardiology

## 2021-08-05 SURGERY — LEFT HEART CATH AND CORONARY ANGIOGRAPHY

## 2021-08-07 LAB — CBC
Hematocrit: 43.5 % (ref 37.5–51.0)
Hemoglobin: 14.6 g/dL (ref 13.0–17.7)
MCH: 29.9 pg (ref 26.6–33.0)
MCHC: 33.6 g/dL (ref 31.5–35.7)
MCV: 89 fL (ref 79–97)
Platelets: 241 10*3/uL (ref 150–450)
RBC: 4.88 x10E6/uL (ref 4.14–5.80)
RDW: 13.3 % (ref 11.6–15.4)
WBC: 9.2 10*3/uL (ref 3.4–10.8)

## 2021-08-07 LAB — BASIC METABOLIC PANEL
BUN/Creatinine Ratio: 10 (ref 9–20)
BUN: 15 mg/dL (ref 6–24)
CO2: 22 mmol/L (ref 20–29)
Calcium: 9 mg/dL (ref 8.7–10.2)
Chloride: 105 mmol/L (ref 96–106)
Creatinine, Ser: 1.54 mg/dL — ABNORMAL HIGH (ref 0.76–1.27)
Glucose: 92 mg/dL (ref 70–99)
Potassium: 4.6 mmol/L (ref 3.5–5.2)
Sodium: 140 mmol/L (ref 134–144)
eGFR: 54 mL/min/{1.73_m2} — ABNORMAL LOW (ref >59)

## 2021-08-19 ENCOUNTER — Other Ambulatory Visit: Payer: Self-pay

## 2021-08-19 ENCOUNTER — Telehealth (HOSPITAL_COMMUNITY): Payer: Self-pay

## 2021-08-19 ENCOUNTER — Encounter (HOSPITAL_COMMUNITY): Payer: Self-pay

## 2021-08-19 ENCOUNTER — Encounter (HOSPITAL_COMMUNITY)
Admission: RE | Admit: 2021-08-19 | Discharge: 2021-08-19 | Disposition: A | Discharge status: Home / Self Care | Payer: Medicaid Other | Source: Ambulatory Visit | Attending: Cardiology | Admitting: Cardiology

## 2021-08-19 VITALS — BP 100/68 | HR 70 | Ht 67.75 in | Wt 179.5 lb

## 2021-08-19 DIAGNOSIS — Z955 Presence of coronary angioplasty implant and graft: Secondary | ICD-10-CM | POA: Insufficient documentation

## 2021-08-19 DIAGNOSIS — I2102 ST elevation (STEMI) myocardial infarction involving left anterior descending coronary artery: Secondary | ICD-10-CM | POA: Insufficient documentation

## 2021-08-19 NOTE — Telephone Encounter (Signed)
Pt insurance is active and benefits verified through [facility]. Co-pay $3.00, DED $0.00/$0.00 met, out of pocket $0.00/$0.00 met, co-insurance 0%. No pre-authorization required. 08/19/21 @ 941AM, [facility], [id number]

## 2021-08-19 NOTE — Progress Notes (Signed)
Cardiac Rehab Medication Review by a Nurse  Does the patient  feel that his/her medications are working for him/her?  YES  Has the patient been experiencing any side effects to the medications prescribed?  NO  Does the patient measure his/her own blood pressure or blood glucose at home?  YES   Does the patient have any problems obtaining medications due to transportation or finances?    NO  Understanding of regimen: good Understanding of indications: good Potential of compliance: excellent    Nurse comments: Jacob [name] reports that he has not started taking his losartan yet. Blood pressures today were only obtainable by auto bp cuff. Will notify Dr Einar Gip about today's blood pressures. Jacob [name]'s his blood pressure and oxygen saturations at home.    Jacob [name] Jacob Postel RN 08/19/2021 3:00 PM

## 2021-08-19 NOTE — Telephone Encounter (Signed)
Called patient to see if he was interested in participating in the Cardiac Rehab Program. Patient stated yes. Patient will come in for orientation on 08/19/21 @ 1030AM and will attend the 1PM exercise class. Went over insurance, patient verbalized understanding.

## 2021-08-19 NOTE — Progress Notes (Signed)
Cardiac Individual Treatment Plan  Patient Details  Name: Jacob [name] MRN: [id number] Date of Birth: Mar 11, 1971 Referring Provider:   Flowsheet Row CARDIAC REHAB PHASE II ORIENTATION from 08/19/2021 in [place]  Referring Provider Adrian Prows, MD       Initial Encounter Date:  [place] PHASE II ORIENTATION from 08/19/2021 in [place]  Date 08/19/21       Visit Diagnosis: 07/01/21 STEMI  07/01/21 S/P DES LAD, [place] RCA- medical treatment  Patient's Home Medications on Admission:  Current Outpatient Medications:    atorvastatin (LIPITOR) 80 MG tablet, Take 1 tablet (80 mg total) by mouth daily., Disp: 30 tablet, Rfl: 3   BAYER LOW DOSE 81 MG chewable tablet, Chew 81 mg by mouth daily., Disp: , Rfl:    levothyroxine (SYNTHROID) 125 MCG tablet, Take 125 mcg by mouth daily before breakfast., Disp: , Rfl:    metoprolol succinate (TOPROL-XL) 25 MG 24 hr tablet, Take 1 tablet (25 mg total) by mouth daily., Disp: 30 tablet, Rfl: 3   nitroGLYCERIN (NITROSTAT) 0.4 MG SL tablet, Place 1 tablet (0.4 mg total) under the tongue every 5 (five) minutes as needed for chest pain., Disp: 30 tablet, Rfl: 12   TABRECTA 200 MG tablet, Take 400 mg by mouth in the morning and at bedtime., Disp: , Rfl:    ticagrelor (BRILINTA) 90 MG TABS tablet, Take 1 tablet (90 mg total) by mouth 2 (two) times daily., Disp: 60 tablet, Rfl: 3   losartan (COZAAR) 25 MG tablet, Take 0.5 tablets (12.5 mg total) by mouth daily after supper. 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[place])     Tobacco Use: Social History   Tobacco Use  Smoking Status Every Day   Packs/day: 0.25   Years: 40.00   Pack years: 10.00   Types: Cigarettes  Smokeless Tobacco Never  Tobacco Comments    Given phone number to [facility] quit line encouraged smoking cessation    Labs: Recent Review Flowsheet Data     Labs for ITP Cardiac and Pulmonary Rehab Latest Ref Rng & Units 07/01/2021   Cholestrol 0 - 200 mg/dL 155   LDLCALC 0 - 99 mg/dL 105(H)   HDL >40 mg/dL 34(L)   Trlycerides <150 mg/dL 82   Hemoglobin A1c 4.8 - 5.6 % 6.3(H)   TCO2 22 - 32 mmol/L 16(L)       Capillary Blood Glucose: Lab Results  Component Value Date   GLUCAP 92 07/03/2021   GLUCAP 75 02/25/2021     Exercise Target Goals: Exercise Program Goal: Individual exercise prescription set using results from initial 6 min walk test and THRR while considering  patients activity barriers and safety.   Exercise Prescription Goal: Starting with aerobic activity 30 plus minutes a day, 3 days per week for initial exercise prescription. Provide home exercise prescription and guidelines that participant acknowledges understanding prior to discharge.  Activity Barriers & Risk Stratification:  Activity Barriers & Cardiac Risk Stratification - 08/19/21 1303       Activity Barriers & Cardiac Risk Stratification   Activity Barriers Deconditioning;Muscular Weakness    Cardiac Risk Stratification High             6 Minute Walk:  6 Minute Walk     Row Name  08/19/21 1123         6 Minute Walk   Phase Initial     Distance 1146 feet     Walk Time 6 minutes     # of Rest Breaks 0     MPH 2.17     METS 3.47     RPE 12     Perceived Dyspnea  0     VO2 Peak 12.16     Symptoms Yes (comment)     Comments burning sensation in hips; denies it is pain     Resting HR 76 bpm     Resting BP 100/68     Resting Oxygen Saturation  97 %     Exercise Oxygen Saturation  during 6 min walk 97 %     Max Ex. HR 91 bpm     Max Ex. BP 113/78     2 Minute Post BP 105/71              Oxygen Initial Assessment:   Oxygen Re-Evaluation:   Oxygen Discharge (Final Oxygen Re-Evaluation):   Initial Exercise Prescription:   Initial Exercise Prescription - 08/19/21 1300       Date of Initial Exercise RX and Referring Provider   Date 08/19/21    Referring Provider Adrian Prows, MD    Expected Discharge Date 10/17/21      NuStep   Level 2    SPM 80    Minutes 15    METs 2.4      Arm Ergometer   Level 2    RPM 60    Minutes 15    METs 2.4      Prescription Details   Frequency (times per week) 3    Duration Progress to 30 minutes of continuous aerobic without signs/symptoms of physical distress      Intensity   THRR 40-80% of Max Heartrate 68-135    Ratings of Perceived Exertion 11-13    Perceived Dyspnea 0-4      Progression   Progression Continue progressive overload as per policy without signs/symptoms or physical distress.      Resistance Training   Training Prescription Yes    Weight 4 lbs    Reps 10-15             Perform Capillary Blood Glucose checks as needed.  Exercise Prescription Changes:   Exercise Comments:   Exercise Goals and Review:   Exercise Goals     Row Name 08/19/21 1301             Exercise Goals   Increase Physical Activity Yes       Intervention Provide advice, education, support and counseling about physical activity/exercise needs.;Develop an individualized exercise prescription for aerobic and resistive training based on initial evaluation findings, risk stratification, comorbidities and participant's personal goals.       Expected Outcomes Short Term: Attend rehab on a regular basis to increase amount of physical activity.;Long Term: Add in home exercise to make exercise part of routine and to increase amount of physical activity.;Long Term: Exercising regularly at least 3-5 days a week.       Increase Strength and Stamina Yes       Intervention Provide advice, education, support and counseling about physical activity/exercise needs.;Develop an individualized exercise prescription for aerobic and resistive training based on initial evaluation  findings, risk stratification, comorbidities and participant's personal goals.       Expected Outcomes Short Term: Increase workloads from initial  exercise prescription for resistance, speed, and METs.;Short Term: Perform resistance training exercises routinely during rehab and add in resistance training at home;Long Term: Improve cardiorespiratory fitness, muscular endurance and strength as measured by increased METs and functional capacity (6MWT)       Able to understand and use rate of perceived exertion (RPE) scale Yes       Intervention Provide education and explanation on how to use RPE scale       Expected Outcomes Short Term: Able to use RPE daily in rehab to express subjective intensity level;Long Term:  Able to use RPE to guide intensity level when exercising independently       Knowledge and understanding of Target Heart Rate Range (THRR) Yes       Intervention Provide education and explanation of THRR including how the numbers were predicted and where they are located for reference       Expected Outcomes Short Term: Able to state/look up THRR;Short Term: Able to use daily as guideline for intensity in rehab;Long Term: Able to use THRR to govern intensity when exercising independently       Understanding of Exercise Prescription Yes       Intervention Provide education, explanation, and written materials on patient's individual exercise prescription       Expected Outcomes Short Term: Able to explain program exercise prescription;Long Term: Able to explain home exercise prescription to exercise independently                Exercise Goals Re-Evaluation :    Discharge Exercise Prescription (Final Exercise Prescription Changes):   Nutrition:  Target Goals: Understanding of nutrition guidelines, daily intake of sodium 1500mg , cholesterol 200mg , calories 30% from fat and 7% or less from saturated fats, daily to have 5 or more servings of fruits and vegetables.  Biometrics:  Pre  Biometrics - 08/19/21 1030       Pre Biometrics   Waist Circumference 37 inches    Hip Circumference 41.5 inches    Waist to Hip Ratio 0.89 %    Triceps Skinfold 12 mm    % Body Fat 24.6 %    Grip Strength 52 kg    Flexibility 18 in    Single Leg Stand 30 seconds              Nutrition Therapy Plan and Nutrition Goals:   Nutrition Assessments:  MEDIFICTS Score Key: ?70 Need to make dietary changes  40-70 Heart Healthy Diet ? 40 Therapeutic Level Cholesterol Diet   Picture Your Plate Scores: <95 Unhealthy dietary pattern with much room for improvement. 41-50 Dietary pattern unlikely to meet recommendations for good health and room for improvement. 51-60 More healthful dietary pattern, with some room for improvement.  >60 Healthy dietary pattern, although there may be some specific behaviors that could be improved.    Nutrition Goals Re-Evaluation:   Nutrition Goals Discharge (Final Nutrition Goals Re-Evaluation):   Psychosocial: Target Goals: Acknowledge presence or absence of significant depression and/or stress, maximize coping skills, provide positive support system. Participant is able to verbalize types and ability to use techniques and skills needed for reducing stress and depression.  Initial Review & Psychosocial Screening:  Initial Psych Review & Screening - 08/19/21 1510       Initial Review   Current issues with Current Stress Concerns    Source of Stress Concerns Chronic Illness    Comments Nori Riis is a cancer survivor.      Family Dynamics   Good Support  System? Yes   Nori Riis has his girlfriend for support     Barriers   Psychosocial barriers to participate in program The patient should benefit from training in stress management and relaxation.      Screening Interventions   Interventions To provide support and resources with identified psychosocial needs;Encouraged to exercise;Provide feedback about the scores to participant    Expected Outcomes  Long Term Goal: Stressors or current issues are controlled or eliminated.;Short Term goal: Identification and review with participant of any Quality of Life or Depression concerns found by scoring the questionnaire.;Long Term goal: The participant improves quality of Life and PHQ9 Scores as seen by post scores and/or verbalization of changes             Quality of Life Scores:  Quality of Life - 08/19/21 1306       Quality of Life   Select Quality of Life      Quality of Life Scores   Health/Function Pre 18.87 %    Socioeconomic Pre 21.63 %    Psych/Spiritual Pre 21.43 %    Family Pre 30 %    GLOBAL Pre 21.6 %            Scores of 19 and below usually indicate a poorer quality of life in these areas.  A difference of  2-3 points is a clinically meaningful difference.  A difference of 2-3 points in the total score of the Quality of Life Index has been associated with significant improvement in overall quality of life, self-image, physical symptoms, and general health in studies assessing change in quality of life.  PHQ-9: Recent Review Flowsheet Data     Depression screen [facility] 2/9 08/19/2021   Decreased Interest 0   Down, Depressed, Hopeless 0   PHQ - 2 Score 0      Interpretation of Total Score  Total Score Depression Severity:  1-4 = Minimal depression, 5-9 = Mild depression, 10-14 = Moderate depression, 15-19 = Moderately severe depression, 20-27 = Severe depression   Psychosocial Evaluation and Intervention:   Psychosocial Re-Evaluation:   Psychosocial Discharge (Final Psychosocial Re-Evaluation):   Vocational Rehabilitation: Provide vocational rehab assistance to qualifying candidates.   Vocational Rehab Evaluation & Intervention:  Vocational Rehab - 08/19/21 1512       Initial Vocational Rehab Evaluation & Intervention   Assessment shows need for Vocational Rehabilitation No   Nori Riis is disabled and does not need vocational rehab at this time.             Education: Education Goals: Education classes will be provided on a weekly basis, covering required topics. Participant will state understanding/return demonstration of topics presented.  Learning Barriers/Preferences:  Learning Barriers/Preferences - 08/19/21 1307       Learning Barriers/Preferences   Learning Barriers None    Learning Preferences Skilled Demonstration;Group Instruction;Individual Instruction             Education Topics: Hypertension, Hypertension Reduction -Define heart disease and high blood pressure. Discus how high blood pressure affects the body and ways to reduce high blood pressure.   Exercise and Your Heart -Discuss why it is important to exercise, the FITT principles of exercise, normal and abnormal responses to exercise, and how to exercise safely.   Angina -Discuss definition of angina, causes of angina, treatment of angina, and how to decrease risk of having angina.   Cardiac Medications -Review what the following cardiac medications are used for, how they affect the body, and side effects that  may occur when taking the medications.  Medications include Aspirin, Beta blockers, calcium channel blockers, ACE Inhibitors, angiotensin receptor blockers, diuretics, digoxin, and antihyperlipidemics.   Congestive Heart Failure -Discuss the definition of CHF, how to live with CHF, the signs and symptoms of CHF, and how keep track of weight and sodium intake.   Heart Disease and Intimacy -Discus the effect sexual activity has on the heart, how changes occur during intimacy as we age, and safety during sexual activity.   Smoking Cessation / COPD -Discuss different methods to quit smoking, the health benefits of quitting smoking, and the definition of COPD.   Nutrition I: Fats -Discuss the types of cholesterol, what cholesterol does to the heart, and how cholesterol levels can be controlled.   Nutrition II: Labels -Discuss the different  components of food labels and how to read food label   Heart Parts/Heart Disease and PAD -Discuss the anatomy of the heart, the pathway of blood circulation through the heart, and these are affected by heart disease.   Stress I: Signs and Symptoms -Discuss the causes of stress, how stress may lead to anxiety and depression, and ways to limit stress.   Stress II: Relaxation -Discuss different types of relaxation techniques to limit stress.   Warning Signs of Stroke / TIA -Discuss definition of a stroke, what the signs and symptoms are of a stroke, and how to identify when someone is having stroke.   Knowledge Questionnaire Score:  Knowledge Questionnaire Score - 08/19/21 1308       Knowledge Questionnaire Score   Pre Score 27/28             Core Components/Risk Factors/Patient Goals at Admission:  Personal Goals and Risk Factors at Admission - 08/19/21 1308       Core Components/Risk Factors/Patient Goals on Admission    Weight Management Weight Maintenance    Tobacco Cessation Yes    Number of packs per day .20 (3-4)    Intervention Assist the participant in steps to quit. Provide individualized education and counseling about committing to Tobacco Cessation, relapse prevention, and pharmacological support that can be provided by physician.;Advice worker, assist with locating and accessing local/national Quit Smoking programs, and support quit date choice.    Expected Outcomes Short Term: Will demonstrate readiness to quit, by selecting a quit date.;Short Term: Will quit all tobacco product use, adhering to prevention of relapse plan.;Long Term: Complete abstinence from all tobacco products for at least 12 months from quit date.    Lipids Yes    Intervention Provide education and support for participant on nutrition & aerobic/resistive exercise along with prescribed medications to achieve LDL 70mg , HDL >40mg .    Expected Outcomes Short Term: Participant  states understanding of desired cholesterol values and is compliant with medications prescribed. Participant is following exercise prescription and nutrition guidelines.;Long Term: Cholesterol controlled with medications as prescribed, with individualized exercise RX and with personalized nutrition plan. Value goals: LDL < 70mg , HDL > 40 mg.             Core Components/Risk Factors/Patient Goals Review:    Core Components/Risk Factors/Patient Goals at Discharge (Final Review):    ITP Comments:  ITP Comments     Row Name 08/19/21 1444           ITP Comments Dr Fransico Him MD, Medical Director                Comments:Neal attended orientation on 08/19/2021 to review rules and guidelines for  program.  Completed 6 minute walk test, Intitial ITP, and exercise prescription.  VSS. Telemetry-Sinus Rhythm. Nori Riis reported experiencing a burning sensation in his hips today otherwise patient asymptomatic . Safety measures and social distancing in place per CDC guidelines. Trace bilateral lower leg edema noted greater on the right Nori Riis reports this as chronic for the past year.Harrell Gave RN BSN

## 2021-08-21 ENCOUNTER — Telehealth (HOSPITAL_COMMUNITY): Payer: Self-pay | Admitting: *Deleted

## 2021-08-21 NOTE — Telephone Encounter (Signed)
-----   Message from Adrian Prows, MD sent at 08/20/2021  9:44 PM EST ----- Regarding: RE: losartan Sure, I will follow. He can continue to hold. As long as he is asymptomatic, it should be okay to have low BP ----- Message ----- From: Magda Kiel, RN Sent: 08/19/2021   3:31 PM EST To: Adrian Prows, MD, Gaye Alken, CMA Subject: losartan                                       Good afternoon Dr Einar Gip,  Jacob Jacob [name] attended cardiac rehab this morning. Vital signs are as follows Resting HR 76 bpm     Resting BP 100/68    Resting Oxygen Saturation  97 %    Exercise Oxygen Saturation  during 6 min walk 97 %    Max Ex. HR 91 bpm    Max Ex. BP 113/78    2 Minute Post BP 105/71    We had a hard time auscultating a manual blood pressure on Jacob [name] this morning. The above noted are auto bp's. Jacob [name] has not started taking losartan yet as you ordered. I wanted to being it you your attention.  Jacob [name] has chronic trace lower extremity edema says he has had for the past year.  Sincerely, Barnet Pall RN Cardiac rehab

## 2021-08-25 ENCOUNTER — Encounter (HOSPITAL_COMMUNITY)
Admission: RE | Admit: 2021-08-25 | Discharge: 2021-08-25 | Disposition: A | Discharge status: Home / Self Care | Payer: Medicaid Other | Source: Ambulatory Visit | Attending: Cardiology | Admitting: Cardiology

## 2021-08-25 ENCOUNTER — Other Ambulatory Visit: Payer: Self-pay

## 2021-08-25 DIAGNOSIS — I2102 ST elevation (STEMI) myocardial infarction involving left anterior descending coronary artery: Secondary | ICD-10-CM | POA: Diagnosis not present

## 2021-08-25 DIAGNOSIS — Z955 Presence of coronary angioplasty implant and graft: Secondary | ICD-10-CM | POA: Diagnosis present

## 2021-08-25 NOTE — Progress Notes (Signed)
Daily Session Note  Patient Details  Name: Jacob [name] MRN: [id number] Date of Birth: 1970-08-04 Referring Provider:   Flowsheet Row CARDIAC REHAB PHASE II ORIENTATION from 08/19/2021 in [place]  Referring Provider Adrian Prows, MD       Encounter Date: 08/25/2021  Check In:  Session Check In - 08/25/21 1300       Check-In   Supervising physician immediately available to respond to emergencies [facility] immediately available    Physician(s) Dr Avon Gully    Location [facility]    Staff Present Barnet Pall, RN, Milus Glazier, MS, ACSM-CEP, CCRP, Exercise Physiologist;Olinty Celesta Aver, MS, ACSM CEP, Exercise Physiologist;Jetta Gilford Rile BS, ACSM EP-C, Exercise Physiologist;Annedrea Rosezella [place], RN, [place]    Virtual Visit No    Medication changes reported     No    Fall or balance concerns reported    No    Tobacco Cessation No Change    Current number of cigarettes/nicotine per day     4   Encouraged smoking cessation given phone numger to [place] quit line   Warm-up and Cool-down Performed as group-led instruction   Cardiac Rehab Orientation   Resistance Training Performed Yes    VAD Patient? No    PAD/SET Patient? No      Pain Assessment   Currently in Pain? No/denies    Pain Score 0-No pain    Multiple Pain Sites No             Capillary Blood Glucose: No results found for this or any previous visit (from the past 24 hour(s)).   Exercise Prescription Changes - 08/25/21 1400       Response to Exercise   Blood Pressure (Admit) 110/62    Blood Pressure (Exercise) 120/80    Blood Pressure (Exit) 98/60    Heart Rate (Admit) 82 bpm    Heart Rate (Exercise) 114 bpm    Heart Rate (Exit) 92 bpm    Oxygen Saturation (Exercise) 99 %    Rating of Perceived Exertion (Exercise) 13    Perceived Dyspnea (Exercise) 1    Symptoms Mild SOB, RPD = 1    Comments Pt's first day in the [facility]2 program    Duration  Continue with 30 min of aerobic exercise without signs/symptoms of physical distress.    Intensity THRR unchanged      Progression   Progression Continue to progress workloads to maintain intensity without signs/symptoms of physical distress.    Average METs 2.2      Resistance Training   Training Prescription Yes    Weight 4 lbs    Reps 10-15    Time 15 Minutes      Interval Training   Interval Training No      NuStep   Level 2    SPM 75    Minutes 15    METs 2.2      Arm Ergometer   Level 2    RPM 50    Minutes 15    METs 2.3             Social History   Tobacco Use  Smoking Status Every Day   Packs/day: 0.25   Years: 40.00   Pack years: 10.00   Types: Cigarettes  Smokeless Tobacco Never  Tobacco Comments   Given phone number to [place] quit line encouraged smoking cessation    Goals Met:  Exercise tolerated well No report of concerns or symptoms today Strength  training completed today  Goals Unmet:  Not Applicable  Comments: Jacob [name] started cardiac rehab today.  Pt tolerated light exercise without difficulty. VSS, telemetry-Sinus Rhythm, asymptomatic.  Medication list reconciled. Pt denies barriers to medicaiton compliance.  PSYCHOSOCIAL ASSESSMENT:  PHQ-0. Pt exhibits positive coping skills, hopeful outlook with supportive family.QUALITY OF LIFE SCORE REVIEW  Pt completed Quality of Life survey as a participant in Cardiac Rehab.  Scores 21.0 or below are considered low.  Pt score very low in several areas Overall 21.60, Health and Function 18.87, socioeconomic 21.63, physiological and spiritual 21.43, family 30.0. Patient quality of life slightly altered by physical constraints which limits ability to perform as prior to recent cardiac illness.Jacob [name] says that he is dissatisfied with his health due to his CAD and lung caner diagnosis.  Offered emotional support and reassurance. Jacob [name] has his girl friend for support and denies  being depressed  at this time. Will  continue to monitor and intervene as necessary.     Pt enjoys wood working and Paramedic games.   Pt oriented to exercise equipment and routine.    Understanding verbalized. Jacob Gave RN BSN    Dr. Fransico Him is Medical Director for Cardiac Rehab at [facility].

## 2021-08-26 ENCOUNTER — Telehealth: Payer: Self-pay

## 2021-08-27 ENCOUNTER — Encounter (HOSPITAL_COMMUNITY)
Admission: RE | Admit: 2021-08-27 | Discharge: 2021-08-27 | Disposition: A | Discharge status: Home / Self Care | Payer: Medicare Other | Source: Ambulatory Visit | Attending: Cardiology | Admitting: Cardiology

## 2021-08-27 ENCOUNTER — Other Ambulatory Visit: Payer: Self-pay

## 2021-08-27 DIAGNOSIS — Z955 Presence of coronary angioplasty implant and graft: Secondary | ICD-10-CM | POA: Diagnosis present

## 2021-08-27 DIAGNOSIS — I252 Old myocardial infarction: Secondary | ICD-10-CM | POA: Diagnosis present

## 2021-08-27 DIAGNOSIS — I2102 ST elevation (STEMI) myocardial infarction involving left anterior descending coronary artery: Secondary | ICD-10-CM

## 2021-08-27 DIAGNOSIS — I25118 Atherosclerotic heart disease of native coronary artery with other forms of angina pectoris: Secondary | ICD-10-CM | POA: Diagnosis not present

## 2021-08-29 ENCOUNTER — Other Ambulatory Visit: Payer: Self-pay

## 2021-08-29 ENCOUNTER — Encounter (HOSPITAL_COMMUNITY)
Admission: RE | Admit: 2021-08-29 | Discharge: 2021-08-29 | Disposition: A | Discharge status: Home / Self Care | Payer: Medicare Other | Source: Ambulatory Visit | Attending: Cardiology | Admitting: Cardiology

## 2021-08-29 DIAGNOSIS — I252 Old myocardial infarction: Secondary | ICD-10-CM | POA: Diagnosis not present

## 2021-08-29 DIAGNOSIS — I2102 ST elevation (STEMI) myocardial infarction involving left anterior descending coronary artery: Secondary | ICD-10-CM

## 2021-08-29 DIAGNOSIS — Z955 Presence of coronary angioplasty implant and graft: Secondary | ICD-10-CM

## 2021-09-01 ENCOUNTER — Encounter (HOSPITAL_COMMUNITY): Payer: Medicare Other

## 2021-09-01 ENCOUNTER — Other Ambulatory Visit: Payer: Self-pay | Admitting: Internal Medicine

## 2021-09-01 ENCOUNTER — Other Ambulatory Visit (HOSPITAL_COMMUNITY): Payer: Self-pay | Admitting: Internal Medicine

## 2021-09-01 DIAGNOSIS — C342 Malignant neoplasm of middle lobe, bronchus or lung: Secondary | ICD-10-CM

## 2021-09-01 DIAGNOSIS — E039 Hypothyroidism, unspecified: Secondary | ICD-10-CM

## 2021-09-01 DIAGNOSIS — I6349 Cerebral infarction due to embolism of other cerebral artery: Secondary | ICD-10-CM

## 2021-09-03 ENCOUNTER — Encounter (HOSPITAL_COMMUNITY): Payer: Medicare Other

## 2021-09-03 ENCOUNTER — Telehealth (HOSPITAL_COMMUNITY): Payer: Self-pay | Admitting: Emergency Medicine

## 2021-09-05 ENCOUNTER — Encounter (HOSPITAL_COMMUNITY): Payer: Medicare Other

## 2021-09-08 ENCOUNTER — Telehealth (HOSPITAL_COMMUNITY): Payer: Self-pay | Admitting: *Deleted

## 2021-09-08 ENCOUNTER — Ambulatory Visit: Payer: Medicare Other | Admitting: Student

## 2021-09-08 ENCOUNTER — Encounter (HOSPITAL_COMMUNITY): Payer: Medicare Other

## 2021-09-08 NOTE — Telephone Encounter (Signed)
Called to inquire about return to cardiac rehab. Unable to leave a message.Barnet Pall, RN,BSN 09/08/2021 1:29 PM

## 2021-09-08 NOTE — Progress Notes (Signed)
Cardiac Individual Treatment Plan  Patient Details  Name: Jacob [name] MRN: [id number] Date of Birth: 01-01-1971 Referring Provider:   Flowsheet Row CARDIAC REHAB PHASE II ORIENTATION from 08/19/2021 in [facility]  Referring Provider Adrian Prows, MD       Initial Encounter Date:  [place] PHASE II ORIENTATION from 08/19/2021 in [facility]  Date 08/19/21       Visit Diagnosis: 07/01/21 STEMI  07/01/21 S/P DES LAD, [place] RCA- medical treatment  Patient's Home Medications on Admission:  Current Outpatient Medications:    atorvastatin (LIPITOR) 80 MG tablet, Take 1 tablet (80 mg total) by mouth daily., Disp: 30 tablet, Rfl: 3   BAYER LOW DOSE 81 MG chewable tablet, Chew 81 mg by mouth daily., Disp: , Rfl:    levothyroxine (SYNTHROID) 125 MCG tablet, Take 125 mcg by mouth daily before breakfast., Disp: , Rfl:    losartan (COZAAR) 25 MG tablet, Take 0.5 tablets (12.5 mg total) by mouth daily after supper. (Patient not taking: Reported on 08/19/2021), Disp: 90 tablet, Rfl: 3   metoprolol succinate (TOPROL-XL) 25 MG 24 hr tablet, Take 1 tablet (25 mg total) by mouth daily., Disp: 30 tablet, Rfl: 3   nitroGLYCERIN (NITROSTAT) 0.4 MG SL tablet, Place 1 tablet (0.4 mg total) under the tongue every 5 (five) minutes as needed for chest pain., Disp: 30 tablet, Rfl: 12   TABRECTA 200 MG tablet, Take 400 mg by mouth in the morning and at bedtime., Disp: , Rfl:    ticagrelor (BRILINTA) 90 MG TABS tablet, Take 1 tablet (90 mg total) by mouth 2 (two) times daily., Disp: 60 tablet, Rfl: 3  Past Medical History: Past Medical History:  Diagnosis Date   Lung cancer ([place])    Stroke ([place])    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[place])     Tobacco Use: Social History   Tobacco Use  Smoking Status Every Day   Packs/day: 0.25   Years: 40.00   Pack years: 10.00   Types: Cigarettes  Smokeless Tobacco Never  Tobacco Comments    Given phone number to [place] quit line encouraged smoking cessation    Labs: Recent Review Flowsheet Data     Labs for ITP Cardiac and Pulmonary Rehab Latest Ref Rng & Units 07/01/2021   Cholestrol 0 - 200 mg/dL 155   LDLCALC 0 - 99 mg/dL 105(H)   HDL >40 mg/dL 34(L)   Trlycerides <150 mg/dL 82   Hemoglobin A1c 4.8 - 5.6 % 6.3(H)   TCO2 22 - 32 mmol/L 16(L)       Capillary Blood Glucose: Lab Results  Component Value Date   GLUCAP 92 07/03/2021   GLUCAP 75 02/25/2021     Exercise Target Goals: Exercise Program Goal: Individual exercise prescription set using results from initial 6 min walk test and THRR while considering  patients activity barriers and safety.   Exercise Prescription Goal: Starting with aerobic activity 30 plus minutes a day, 3 days per week for initial exercise prescription. Provide home exercise prescription and guidelines that participant acknowledges understanding prior to discharge.  Activity Barriers & Risk Stratification:  Activity Barriers & Cardiac Risk Stratification - 08/19/21 1303       Activity Barriers & Cardiac Risk Stratification   Activity Barriers Deconditioning;Muscular Weakness    Cardiac Risk Stratification High             6 Minute Walk:  6 Minute Walk     Row Name  08/19/21 1123         6 Minute Walk   Phase Initial     Distance 1146 feet     Walk Time 6 minutes     # of Rest Breaks 0     MPH 2.17     METS 3.47     RPE 12     Perceived Dyspnea  0     VO2 Peak 12.16     Symptoms Yes (comment)     Comments burning sensation in hips; denies it is pain     Resting HR 76 bpm     Resting BP 100/68     Resting Oxygen Saturation  97 %     Exercise Oxygen Saturation  during 6 min walk 97 %     Max Ex. HR 91 bpm     Max Ex. BP 113/78     2 Minute Post BP 105/71              Oxygen Initial Assessment:   Oxygen Re-Evaluation:   Oxygen Discharge (Final Oxygen Re-Evaluation):   Initial Exercise Prescription:   Initial Exercise Prescription - 08/19/21 1300       Date of Initial Exercise RX and Referring Provider   Date 08/19/21    Referring Provider Adrian Prows, MD    Expected Discharge Date 10/17/21      NuStep   Level 2    SPM 80    Minutes 15    METs 2.4      Arm Ergometer   Level 2    RPM 60    Minutes 15    METs 2.4      Prescription Details   Frequency (times per week) 3    Duration Progress to 30 minutes of continuous aerobic without signs/symptoms of physical distress      Intensity   THRR 40-80% of Max Heartrate 68-135    Ratings of Perceived Exertion 11-13    Perceived Dyspnea 0-4      Progression   Progression Continue progressive overload as per policy without signs/symptoms or physical distress.      Resistance Training   Training Prescription Yes    Weight 4 lbs    Reps 10-15             Perform Capillary Blood Glucose checks as needed.  Exercise Prescription Changes:   Exercise Prescription Changes     Row Name 08/25/21 1400             Response to Exercise   Blood Pressure (Admit) 110/62       Blood Pressure (Exercise) 120/80       Blood Pressure (Exit) 98/60       Heart Rate (Admit) 82 bpm       Heart Rate (Exercise) 114 bpm       Heart Rate (Exit) 92 bpm       Oxygen Saturation (Exercise) 99 %       Rating of Perceived Exertion (Exercise) 13       Perceived Dyspnea (Exercise) 1       Symptoms Mild SOB, RPD = 1       Comments Pt's first day in the CRP2 program       Duration Continue with 30 min of aerobic exercise without signs/symptoms of physical distress.       Intensity THRR unchanged         Progression   Progression Continue to progress workloads to maintain intensity  without signs/symptoms of physical distress.       Average METs 2.2         Resistance Training   Training Prescription Yes       Weight 4 lbs       Reps 10-15       Time 15 Minutes         Interval Training   Interval Training No         NuStep   Level 2        SPM 75       Minutes 15       METs 2.2         Arm Ergometer   Level 2       RPM 50       Minutes 15       METs 2.3                Exercise Comments:   Exercise Comments     Row Name 08/25/21 1442           Exercise Comments Pt's first day in the CRP2 program. Tolerated session well.                Exercise Goals and Review:   Exercise Goals     Row Name 08/19/21 1301             Exercise Goals   Increase Physical Activity Yes       Intervention Provide advice, education, support and counseling about physical activity/exercise needs.;Develop an individualized exercise prescription for aerobic and resistive training based on initial evaluation findings, risk stratification, comorbidities and participant's personal goals.       Expected Outcomes Short Term: Attend rehab on a regular basis to increase amount of physical activity.;Long Term: Add in home exercise to make exercise part of routine and to increase amount of physical activity.;Long Term: Exercising regularly at least 3-5 days a week.       Increase Strength and Stamina Yes       Intervention Provide advice, education, support and counseling about physical activity/exercise needs.;Develop an individualized exercise prescription for aerobic and resistive training based on initial evaluation findings, risk stratification, comorbidities and participant's personal goals.       Expected Outcomes Short Term: Increase workloads from initial exercise prescription for resistance, speed, and METs.;Short Term: Perform resistance training exercises routinely during rehab and add in resistance training at home;Long Term: Improve cardiorespiratory fitness, muscular endurance and strength as measured by increased METs and functional capacity (6MWT)       Able to understand and use rate of perceived exertion (RPE) scale Yes       Intervention Provide education and explanation on how to use RPE scale       Expected  Outcomes Short Term: Able to use RPE daily in rehab to express subjective intensity level;Long Term:  Able to use RPE to guide intensity level when exercising independently       Knowledge and understanding of Target Heart Rate Range (THRR) Yes       Intervention Provide education and explanation of THRR including how the numbers were predicted and where they are located for reference       Expected Outcomes Short Term: Able to state/look up THRR;Short Term: Able to use daily as guideline for intensity in rehab;Long Term: Able to use THRR to govern intensity when exercising independently       Understanding of Exercise Prescription Yes  Intervention Provide education, explanation, and written materials on patient's individual exercise prescription       Expected Outcomes Short Term: Able to explain program exercise prescription;Long Term: Able to explain home exercise prescription to exercise independently                Exercise Goals Re-Evaluation :  Exercise Goals Re-Evaluation     Row Name 08/25/21 1441             Exercise Goal Re-Evaluation   Exercise Goals Review Increase Physical Activity;Increase Strength and Stamina;Able to understand and use rate of perceived exertion (RPE) scale;Knowledge and understanding of Target Heart Rate Range (THRR);Understanding of Exercise Prescription       Comments Pt's first day in the CRP2 program. Pt understands the exercise Rx, THRR, and RPE scale.       Expected Outcomes Will continue to monitor patients and progress exercise workloads as tolerated.                 Discharge Exercise Prescription (Final Exercise Prescription Changes):  Exercise Prescription Changes - 08/25/21 1400       Response to Exercise   Blood Pressure (Admit) 110/62    Blood Pressure (Exercise) 120/80    Blood Pressure (Exit) 98/60    Heart Rate (Admit) 82 bpm    Heart Rate (Exercise) 114 bpm    Heart Rate (Exit) 92 bpm    Oxygen Saturation  (Exercise) 99 %    Rating of Perceived Exertion (Exercise) 13    Perceived Dyspnea (Exercise) 1    Symptoms Mild SOB, RPD = 1    Comments Pt's first day in the CRP2 program    Duration Continue with 30 min of aerobic exercise without signs/symptoms of physical distress.    Intensity THRR unchanged      Progression   Progression Continue to progress workloads to maintain intensity without signs/symptoms of physical distress.    Average METs 2.2      Resistance Training   Training Prescription Yes    Weight 4 lbs    Reps 10-15    Time 15 Minutes      Interval Training   Interval Training No      NuStep   Level 2    SPM 75    Minutes 15    METs 2.2      Arm Ergometer   Level 2    RPM 50    Minutes 15    METs 2.3             Nutrition:  Target Goals: Understanding of nutrition guidelines, daily intake of sodium 1500mg , cholesterol 200mg , calories 30% from fat and 7% or less from saturated fats, daily to have 5 or more servings of fruits and vegetables.  Biometrics:  Pre Biometrics - 08/19/21 1030       Pre Biometrics   Waist Circumference 37 inches    Hip Circumference 41.5 inches    Waist to Hip Ratio 0.89 %    Triceps Skinfold 12 mm    % Body Fat 24.6 %    Grip Strength 52 kg    Flexibility 18 in    Single Leg Stand 30 seconds              Nutrition Therapy Plan and Nutrition Goals:   Nutrition Assessments:  MEDIFICTS Score Key: ?70 Need to make dietary changes  40-70 Heart Healthy Diet ? 40 Therapeutic Level Cholesterol Diet   Picture Your Plate Scores: <  40 Unhealthy dietary pattern with much room for improvement. 41-50 Dietary pattern unlikely to meet recommendations for good health and room for improvement. 51-60 More healthful dietary pattern, with some room for improvement.  >60 Healthy dietary pattern, although there may be some specific behaviors that could be improved.    Nutrition Goals Re-Evaluation:   Nutrition Goals  Discharge (Final Nutrition Goals Re-Evaluation):   Psychosocial: Target Goals: Acknowledge presence or absence of significant depression and/or stress, maximize coping skills, provide positive support system. Participant is able to verbalize types and ability to use techniques and skills needed for reducing stress and depression.  Initial Review & Psychosocial Screening:  Initial Psych Review & Screening - 08/19/21 1510       Initial Review   Current issues with Current Stress Concerns    Source of Stress Concerns Chronic Illness    Comments Nori Riis is a cancer survivor.      Family Dynamics   Good Support System? Yes   Nori Riis has his girlfriend for support     Barriers   Psychosocial barriers to participate in program The patient should benefit from training in stress management and relaxation.      Screening Interventions   Interventions To provide support and resources with identified psychosocial needs;Encouraged to exercise;Provide feedback about the scores to participant    Expected Outcomes Long Term Goal: Stressors or current issues are controlled or eliminated.;Short Term goal: Identification and review with participant of any Quality of Life or Depression concerns found by scoring the questionnaire.;Long Term goal: The participant improves quality of Life and PHQ9 Scores as seen by post scores and/or verbalization of changes             Quality of Life Scores:  Quality of Life - 08/19/21 1306       Quality of Life   Select Quality of Life      Quality of Life Scores   Health/Function Pre 18.87 %    Socioeconomic Pre 21.63 %    Psych/Spiritual Pre 21.43 %    Family Pre 30 %    GLOBAL Pre 21.6 %            Scores of 19 and below usually indicate a poorer quality of life in these areas.  A difference of  2-3 points is a clinically meaningful difference.  A difference of 2-3 points in the total score of the Quality of Life Index has been associated with significant  improvement in overall quality of life, self-image, physical symptoms, and general health in studies assessing change in quality of life.  PHQ-9: Recent Review Flowsheet Data     Depression screen [facility] 2/9 08/19/2021   Decreased Interest 0   Down, Depressed, Hopeless 0   PHQ - 2 Score 0      Interpretation of Total Score  Total Score Depression Severity:  1-4 = Minimal depression, 5-9 = Mild depression, 10-14 = Moderate depression, 15-19 = Moderately severe depression, 20-27 = Severe depression   Psychosocial Evaluation and Intervention:   Psychosocial Re-Evaluation:  Psychosocial Re-Evaluation     Row Name 08/25/21 1640 09/08/21 1336           Psychosocial Re-Evaluation   Current issues with Current Stress Concerns Current Stress Concerns      Comments Reviewed quality of life with Nori Riis dissatisfied with his health. Denies being depressed last day of exercise was on 09/08/21      Expected Outcomes Nori Riis will have decreased depression upon completion of phase  2 cardiac rehab Nori Riis will have decreased depression upon completion of phase 2 cardiac rehab      Interventions Encouraged to attend Cardiac Rehabilitation for the exercise;Stress management education;Relaxation education Encouraged to attend Cardiac Rehabilitation for the exercise;Stress management education;Relaxation education      Continue Psychosocial Services  Follow up required by staff No Follow up required        Initial Review   Source of Stress Concerns Chronic Illness;Unable to perform yard/household activities;Unable to participate in former interests or hobbies;Financial Chronic Illness;Unable to perform yard/household activities;Unable to participate in former interests or hobbies;Financial      Comments Will continue to monitor and offer support as needed Will continue to monitor and offer support as needed               Psychosocial Discharge (Final Psychosocial Re-Evaluation):  Psychosocial  Re-Evaluation - 09/08/21 1336       Psychosocial Re-Evaluation   Current issues with Current Stress Concerns    Comments last day of exercise was on 09/08/21    Expected Outcomes Nori Riis will have decreased depression upon completion of phase 2 cardiac rehab    Interventions Encouraged to attend Cardiac Rehabilitation for the exercise;Stress management education;Relaxation education    Continue Psychosocial Services  No Follow up required      Initial Review   Source of Stress Concerns Chronic Illness;Unable to perform yard/household activities;Unable to participate in former interests or hobbies;Financial    Comments Will continue to monitor and offer support as needed             Vocational Rehabilitation: Provide vocational rehab assistance to qualifying candidates.   Vocational Rehab Evaluation & Intervention:  Vocational Rehab - 08/19/21 1512       Initial Vocational Rehab Evaluation & Intervention   Assessment shows need for Vocational Rehabilitation No   Nori Riis is disabled and does not need vocational rehab at this time.            Education: Education Goals: Education classes will be provided on a weekly basis, covering required topics. Participant will state understanding/return demonstration of topics presented.  Learning Barriers/Preferences:  Learning Barriers/Preferences - 08/19/21 1307       Learning Barriers/Preferences   Learning Barriers None    Learning Preferences Skilled Demonstration;Group Instruction;Individual Instruction             Education Topics: Hypertension, Hypertension Reduction -Define heart disease and high blood pressure. Discus how high blood pressure affects the body and ways to reduce high blood pressure.   Exercise and Your Heart -Discuss why it is important to exercise, the FITT principles of exercise, normal and abnormal responses to exercise, and how to exercise safely.   Angina -Discuss definition of angina, causes  of angina, treatment of angina, and how to decrease risk of having angina.   Cardiac Medications -Review what the following cardiac medications are used for, how they affect the body, and side effects that may occur when taking the medications.  Medications include Aspirin, Beta blockers, calcium channel blockers, ACE Inhibitors, angiotensin receptor blockers, diuretics, digoxin, and antihyperlipidemics.   Congestive Heart Failure -Discuss the definition of CHF, how to live with CHF, the signs and symptoms of CHF, and how keep track of weight and sodium intake.   Heart Disease and Intimacy -Discus the effect sexual activity has on the heart, how changes occur during intimacy as we age, and safety during sexual activity.   Smoking Cessation / COPD -Discuss different methods to quit  smoking, the health benefits of quitting smoking, and the definition of COPD.   Nutrition I: Fats -Discuss the types of cholesterol, what cholesterol does to the heart, and how cholesterol levels can be controlled.   Nutrition II: Labels -Discuss the different components of food labels and how to read food label   Heart Parts/Heart Disease and PAD -Discuss the anatomy of the heart, the pathway of blood circulation through the heart, and these are affected by heart disease.   Stress I: Signs and Symptoms -Discuss the causes of stress, how stress may lead to anxiety and depression, and ways to limit stress.   Stress II: Relaxation -Discuss different types of relaxation techniques to limit stress.   Warning Signs of Stroke / TIA -Discuss definition of a stroke, what the signs and symptoms are of a stroke, and how to identify when someone is having stroke.   Knowledge Questionnaire Score:  Knowledge Questionnaire Score - 08/19/21 1308       Knowledge Questionnaire Score   Pre Score 27/28             Core Components/Risk Factors/Patient Goals at Admission:  Personal Goals and Risk Factors  at Admission - 08/19/21 1308       Core Components/Risk Factors/Patient Goals on Admission    Weight Management Weight Maintenance    Tobacco Cessation Yes    Number of packs per day .20 (3-4)    Intervention Assist the participant in steps to quit. Provide individualized education and counseling about committing to Tobacco Cessation, relapse prevention, and pharmacological support that can be provided by physician.;Advice worker, assist with locating and accessing local/national Quit Smoking programs, and support quit date choice.    Expected Outcomes Short Term: Will demonstrate readiness to quit, by selecting a quit date.;Short Term: Will quit all tobacco product use, adhering to prevention of relapse plan.;Long Term: Complete abstinence from all tobacco products for at least 12 months from quit date.    Lipids Yes    Intervention Provide education and support for participant on nutrition & aerobic/resistive exercise along with prescribed medications to achieve LDL 70mg , HDL >40mg .    Expected Outcomes Short Term: Participant states understanding of desired cholesterol values and is compliant with medications prescribed. Participant is following exercise prescription and nutrition guidelines.;Long Term: Cholesterol controlled with medications as prescribed, with individualized exercise RX and with personalized nutrition plan. Value goals: LDL < 70mg , HDL > 40 mg.             Core Components/Risk Factors/Patient Goals Review:   Goals and Risk Factor Review     Row Name 08/26/21 0752 09/08/21 1339           Core Components/Risk Factors/Patient Goals Review   Personal Goals Review Lipids;Tobacco Cessation Lipids;Tobacco Cessation      Review Nori Riis started exercise on 08/26/21. Nori Riis did well with exercise. Will continue to encourage smoking cessation Neal's last day was on 08/29/20. Nori Riis is doing well with exercise when in attendance      Expected Outcomes Nori Riis will  continue to participate in phase 2 cardiac rehab for exercise, nutrition and lifestyle modifications Nori Riis will continue to participate in phase 2 cardiac rehab for exercise, nutrition and lifestyle modifications               Core Components/Risk Factors/Patient Goals at Discharge (Final Review):   Goals and Risk Factor Review - 09/08/21 1339       Core Components/Risk Factors/Patient Goals Review   Personal Goals  Review Lipids;Tobacco Cessation    Review Neal's last day was on 08/29/20. Nori Riis is doing well with exercise when in attendance    Expected Outcomes Nori Riis will continue to participate in phase 2 cardiac rehab for exercise, nutrition and lifestyle modifications             ITP Comments:  ITP Comments     Row Name 08/19/21 1444 08/25/21 1639 09/08/21 1335       ITP Comments Dr Fransico Him MD, Medical Director 30 Day ITP Review. Nori Riis started exercise on 08/25/20. Nori Riis did well with exercise. 30 Day ITP Review. Nori Riis is off to a good start to exercise. Neal's last day of attendance was on 08/29/21              Comments: See ITP comments.Harrell Gave RN BSN

## 2021-09-10 ENCOUNTER — Encounter (HOSPITAL_COMMUNITY): Payer: Medicare Other

## 2021-09-12 ENCOUNTER — Telehealth (HOSPITAL_COMMUNITY): Payer: Self-pay | Admitting: *Deleted

## 2021-09-12 ENCOUNTER — Encounter (HOSPITAL_COMMUNITY): Payer: Medicare Other

## 2021-09-12 NOTE — Telephone Encounter (Signed)
Unable to leave message will discharge from cardiac rehab at this time. Jacob [name] attended 3 exercise classes between 08/25/21-08/29/21. Jacob [name] did well with exercise Jacob [name] was discharged for nonattendance.Barnet Pall, RN,BSN 09/12/2021 2:04 PM

## 2021-09-14 ENCOUNTER — Telehealth: Payer: Self-pay | Admitting: Student

## 2021-09-14 DIAGNOSIS — I25118 Atherosclerotic heart disease of native coronary artery with other forms of angina pectoris: Secondary | ICD-10-CM

## 2021-09-14 NOTE — Telephone Encounter (Signed)
ON-CALL CARDIOLOGY 09/13/2021  Patient's name: Jacob [name].   MRN: [id number].    DOB: [birth year] Primary care provider: Patient, No Pcp Per (Inactive).  Interaction regarding this patient's care today: Patient called with 3 days of mild constant chest pain which is non-exertional. He denies other associated symptoms. Patient has taken nitroglycerin without relief.   Impression:   ICD-10-CM   1. Coronary artery disease of native artery of native heart with stable angina pectoris ([place])  I25.118       No orders of the defined types were placed in this encounter.   No orders of the defined types were placed in this encounter.   Recommendations: Given patient's history of CAD discussed patient being evaluated in the emergency department, however, patient is resistant to this. Therefore shared decision was to see patient urgently in the office on Monday. In the meantime gave him strict instructions regarding signs and symptoms that would warrant emergent evaluation. Patient verbalized understanding and agreement.   Telephone encounter total time: 7 minutes     Alethia Berthold, PA-C 09/14/2021, 2:51 PM Office: [phone]

## 2021-09-15 ENCOUNTER — Encounter: Payer: Self-pay | Admitting: Student

## 2021-09-15 ENCOUNTER — Encounter (HOSPITAL_COMMUNITY): Payer: Medicare Other

## 2021-09-15 ENCOUNTER — Ambulatory Visit: Payer: Medicare Other | Admitting: Student

## 2021-09-15 ENCOUNTER — Other Ambulatory Visit: Payer: Self-pay

## 2021-09-15 VITALS — BP 109/75 | HR 91 | Temp 98.4°F | Resp 16 | Ht 67.5 in | Wt 182.0 lb

## 2021-09-15 DIAGNOSIS — F17200 Nicotine dependence, unspecified, uncomplicated: Secondary | ICD-10-CM

## 2021-09-15 DIAGNOSIS — I25118 Atherosclerotic heart disease of native coronary artery with other forms of angina pectoris: Secondary | ICD-10-CM

## 2021-09-15 DIAGNOSIS — F17210 Nicotine dependence, cigarettes, uncomplicated: Secondary | ICD-10-CM

## 2021-09-15 NOTE — Progress Notes (Signed)
Primary Physician/Referring:  Patient, No Pcp Per (Inactive)  Patient ID: Jacob [name], male    DOB: August 28, 1970, 51 y.o.   MRN: [id number]  Chief Complaint  Patient presents with   Coronary Artery Disease   Follow-up   Chest Pain   HPI:    Jacob [name]  is a 51 y.o. [place] male patient with history of hyperlipidemia, hyperglycemia, active smoker, history of cardioembolic stroke in June 6734. 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 He has hypothyroidism for which he takes Synthroid which has been managed by his oncologist.  History of anterolateral STEMI/2022 with subsequent stenting to proximal LAD and LVEF 45-50% at that time.  Patient presents for urgent visit at his request today given mild constant central chest pain ongoing for the last 3 days.  Patient has taken 2 nitroglycerin tablets without relief of symptoms.  States symptoms are not worse with exertion and are not limiting his daily activities.  Denies shortness of breath or associated symptoms.  Patient is not taking losartan, and his blood pressure remains soft.  Patient does continue to smoke "a couple cigarettes per day".  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[facility])    Past Surgical History:  Procedure Laterality Date   CARDIAC CATHETERIZATION     LEFT HEART CATH AND CORONARY ANGIOGRAPHY N/A 07/01/2021   Procedure: LEFT HEART CATH AND CORONARY ANGIOGRAPHY;  Surgeon: Adrian Prows, MD;  Location: [facility];  Service: Cardiovascular;  Laterality: N/A;   TESTICLE SURGERY     Family History  Problem Relation Age of Onset   Heart disease Mother    Diabetes Mother     Social History   Tobacco Use   Smoking status: Every Day    Packs/day: 0.25    Years: 40.00    Pack years: 10.00    Types: Cigarettes    Smokeless tobacco: Never   Tobacco comments:    Given phone number to  quit line encouraged smoking cessation  Substance Use Topics   Alcohol use: No   Marital Status: Single   ROS  Review of Systems  Cardiovascular:  Positive for chest pain and dyspnea on exertion (mild, stable). Negative for claudication, leg swelling, near-syncope, orthopnea, palpitations, paroxysmal nocturnal dyspnea and syncope.  Neurological:  Negative for dizziness.   Objective  Blood pressure 109/75, pulse 91, temperature 98.4 F (36.9 C), temperature source Temporal, resp. rate 16, height 5' 7.5" (1.715 m), weight 182 lb (82.6 kg), SpO2 96 %.  Vitals with BMI 09/15/2021 08/19/2021 07/25/2021  Height 5' 7.5" 5' 7.75" 5' 8"   Weight 182 lbs 179 lbs 7 oz 182 lbs  BMI 28.07 19.37 90.24  Systolic 097 353 299  Diastolic 75 68 76  Pulse 91 70 79      Physical Exam Vitals reviewed.  Cardiovascular:     Rate and Rhythm: Normal rate and regular rhythm.     Pulses: Intact distal pulses.          Radial pulses are 2+ on the right side and 2+ on the left side.     Heart sounds: S1 normal and S2 normal. No murmur heard.   No gallop.  Pulmonary:     Effort: Pulmonary effort is normal. No respiratory distress.     Breath sounds: No wheezing, rhonchi or  rales.  Musculoskeletal:     Right lower leg: Edema (trtace) present.     Left lower leg: Edema (trace) present.    Laboratory examination:   Recent Labs    07/01/21 1329 07/01/21 1338 07/02/21 0110 07/03/21 2014 08/06/21 0944  NA 136   < > 137 135 140  K 3.8   < > 4.0 4.0 4.6  CL 109   < > 104 107 105  CO2 16*  --  26 22 22   GLUCOSE 118*   < > 122* 85 92  BUN 20   < > 16 17 15   CREATININE 1.38*   < > 1.21 1.40* 1.54*  CALCIUM 8.4*  --  8.5* 8.3* 9.0  GFRNONAA >60  --  >60 >60  --    < > = values in this interval not displayed.   CrCl cannot be calculated (Patient's most recent lab result is older than the maximum 21 days allowed.).  CMP Latest Ref  Rng & Units 08/06/2021 07/03/2021 07/02/2021  Glucose 70 - 99 mg/dL 92 85 122(H)  BUN 6 - 24 mg/dL 15 17 16   Creatinine 0.76 - 1.27 mg/dL 1.54(H) 1.40(H) 1.21  Sodium 134 - 144 mmol/L 140 135 137  Potassium 3.5 - 5.2 mmol/L 4.6 4.0 4.0  Chloride 96 - 106 mmol/L 105 107 104  CO2 20 - 29 mmol/L 22 22 26   Calcium 8.7 - 10.2 mg/dL 9.0 8.3(L) 8.5(L)  Total Protein 6.5 - 8.1 g/dL - - -  Total Bilirubin 0.3 - 1.2 mg/dL - - -  Alkaline Phos 38 - 126 U/L - - -  AST 15 - 41 U/L - - -  ALT 0 - 44 U/L - - -   CBC Latest Ref Rng & Units 08/06/2021 07/03/2021 07/02/2021  WBC 3.4 - 10.8 x10E3/uL 9.2 11.5(H) 13.0(H)  Hemoglobin 13.0 - 17.7 g/dL 14.6 14.2 13.6  Hematocrit 37.5 - 51.0 % 43.5 42.4 40.6  Platelets 150 - 450 x10E3/uL 241 234 239    Lipid Panel Recent Labs    07/01/21 1329  CHOL 155  TRIG 82  LDLCALC 105*  VLDL 16  HDL 34*  CHOLHDL 4.6    HEMOGLOBIN A1C Lab Results  Component Value Date   HGBA1C 6.3 (H) 07/01/2021   MPG 134.11 07/01/2021   TSH Recent Labs    12/24/20 1138 07/02/21 0110  TSH 29.888* 14.888*    External labs:   None   Allergies  No Known Allergies   Medications Prior to Visit:   Outpatient Medications Prior to Visit  Medication Sig Dispense Refill   atorvastatin (LIPITOR) 80 MG tablet Take 1 tablet (80 mg total) by mouth daily. 30 tablet 3   BAYER LOW DOSE 81 MG chewable tablet Chew 81 mg by mouth daily.     levothyroxine (SYNTHROID) 125 MCG tablet Take 125 mcg by mouth daily before breakfast.     metoprolol succinate (TOPROL-XL) 25 MG 24 hr tablet Take 1 tablet (25 mg total) by mouth daily. 30 tablet 3   nitroGLYCERIN (NITROSTAT) 0.4 MG SL tablet Place 1 tablet (0.4 mg total) under the tongue every 5 (five) minutes as needed for chest pain. 30 tablet 12   TABRECTA 200 MG tablet Take 400 mg by mouth in the morning and at bedtime.     ticagrelor (BRILINTA) 90 MG TABS tablet Take 1 tablet (90 mg total) by mouth 2 (two) times daily. 60 tablet 3    losartan (COZAAR) 25 MG tablet Take 0.5 tablets (  12.5 mg total) by mouth daily after supper. (Patient not taking: Reported on 09/15/2021) 90 tablet 3   No facility-administered medications prior to visit.   Final Medications at End of Visit    Current Meds  Medication Sig   atorvastatin (LIPITOR) 80 MG tablet Take 1 tablet (80 mg total) by mouth daily.   BAYER LOW DOSE 81 MG chewable tablet Chew 81 mg by mouth daily.   levothyroxine (SYNTHROID) 125 MCG tablet Take 125 mcg by mouth daily before breakfast.   metoprolol succinate (TOPROL-XL) 25 MG 24 hr tablet Take 1 tablet (25 mg total) by mouth daily.   nitroGLYCERIN (NITROSTAT) 0.4 MG SL tablet Place 1 tablet (0.4 mg total) under the tongue every 5 (five) minutes as needed for chest pain.   TABRECTA 200 MG tablet Take 400 mg by mouth in the morning and at bedtime.   ticagrelor (BRILINTA) 90 MG TABS tablet Take 1 tablet (90 mg total) by mouth 2 (two) times daily.   Radiology:   No results found.  DG Chest Portable 07/01/2021 The heart size and mediastinal contours are within normal limits. Both lungs are clear. The visualized skeletal structures are unremarkable. Impression: No active disease  Cardiac Studies:   Left Heart Catheterization 07/01/21:  LV: 141/21, EDP 36 mmHg.  Ao 150/95, mean 118 mmHg.  There was no pressure gradient across the aortic valve. LM: Large vessel. LAD: Thrombotic/ulcerated proximal LAD 80 to 90% stenosis with TIMI II flow.  Apical LAD diffusely diseased.  Direct stenting ONYX FRONTIER 3.5X30 mm DES. Maximum pressure: 16 atm. Inflation time: 45 sec. Stent strut is well apposed.  TIMI II to TIMI-3 flow at the end of the procedure, distal embolization of the apical LAD of no clinical consequence with occlusion.  It is also diffusely diseased at the apex. CX: Very large-caliber vessel.  Mid segment has a 40% stenosis. 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  There are  contralateral collaterals noted and ipsilateral collaterals noted to the distal right coronary artery.  The artery is flush occluded after the RV branch, I did try to open up the vessel however unable to obtain access to the true lumen.  However with the attempted angioplasty, contralateral flow improved all the way back to the proximal segment of the RCA from the LAD. Small to moderate sized RCA and severely diffusely diseased.   Echocardiogram 07/01/2021: 1. Left ventricular ejection fraction, by estimation, is 45 to 50%. The  left ventricle has mildly decreased function. The left ventricle  demonstrates regional wall motion abnormalities (see scoring  diagram/findings for description). Left ventricular  diastolic parameters were normal. There is akinesis of the left  ventricular, apical inferoseptal wall and anteroseptal wall.   2. Right ventricular systolic function is normal. The right ventricular  size is normal.   3. The mitral valve is normal in structure. No evidence of mitral valve regurgitation. No evidence of mitral stenosis.   4. The aortic valve is normal in structure. Aortic valve regurgitation is not visualized. No aortic stenosis is present.  EKG:  09/15/2021: Sinus rhythm at a rate of 67 bpm.  Normal axis.  No evidence of ischemia or underlying injury pattern.  07/02/2021: Sinus rhythm at a rate of 81 bpm.  Normal axis.  ST elevations of normalized.  07/01/2021 at 1316 hrs.: Normal sinus rhythm at rate of 61 bpm, hyperacute T waves, inferior and anterolateral STEMI.  Assessment     ICD-10-CM   1.  Coronary artery disease of native artery of native heart with stable angina pectoris ([place])  I25.118 EKG 12-Lead    Troponin T    2. Tobacco use disorder  F17.200     3. Nicotine dependence, cigarettes, uncomplicated  W09.811       There are no discontinued medications.  No orders of the defined types were placed in this encounter.  Recommendations:   Coleman Rayaan Garguilo  is a 51 y.o. Caucasian male patient with history of hyperlipidemia, hyperglycemia, active smoker, history of cardioembolic stroke in June 9147. 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 He has hypothyroidism for which he takes Synthroid which has been managed by his oncologist.  History of anterolateral STEMI/2022 with subsequent stenting to proximal LAD and LVEF 45-50% at that time.  Patient presents for urgent visit with complaints of chest pain.  Patient's symptoms are fairly atypical and EKG is without ischemic changes.  Given patient's history I discussed case with Dr. Einar Gip who agrees that suspicion is low for ischemic etiology of patient's pain.  We will therefore obtain stat troponin, if this is normal recommend patient follow-up with PCP for further evaluation of chest discomfort as it may be related to noncardiac etiology.  We will call patient and advise that he go seek further evaluation if troponin is elevated.  Dr. Einar Gip agrees with this plan particularly given that patient's pain has been ongoing for the last 3 to 4 days.  Again reiterated to patient the importance of smoking cessation and medication compliance.  Patient reports continued medication compliance.  Follow-up as previously scheduled with Dr. Einar Gip, unless troponins elevated or symptoms fail to improve.  Counseled patient regarding signs and symptoms that would warrant urgent or emergent evaluation.  Patient was seen in collaboration with Dr. Einar Gip and he is in agreement with the plan.    Alethia Berthold, PA-C 09/15/2021, 4:47 PM Office: [phone]

## 2021-09-16 LAB — TROPONIN T: Troponin T (Highly Sensitive): 8 ng/L (ref 0–22)

## 2021-09-17 ENCOUNTER — Encounter (HOSPITAL_COMMUNITY): Payer: Medicare Other

## 2021-09-19 ENCOUNTER — Encounter (HOSPITAL_COMMUNITY): Payer: Medicare Other

## 2021-09-22 ENCOUNTER — Encounter (HOSPITAL_COMMUNITY): Payer: Medicare Other

## 2021-09-24 ENCOUNTER — Encounter (HOSPITAL_COMMUNITY): Payer: Medicaid Other

## 2021-09-26 ENCOUNTER — Encounter (HOSPITAL_COMMUNITY): Payer: Medicaid Other

## 2021-09-29 ENCOUNTER — Encounter (HOSPITAL_COMMUNITY): Payer: Medicaid Other

## 2021-10-01 ENCOUNTER — Encounter (HOSPITAL_COMMUNITY): Payer: Medicaid Other

## 2021-10-03 ENCOUNTER — Encounter (HOSPITAL_COMMUNITY): Payer: Medicaid Other

## 2021-10-06 ENCOUNTER — Encounter (HOSPITAL_COMMUNITY): Payer: Medicaid Other

## 2021-10-08 ENCOUNTER — Encounter (HOSPITAL_COMMUNITY): Payer: Medicaid Other

## 2021-10-10 ENCOUNTER — Encounter (HOSPITAL_COMMUNITY): Payer: Medicaid Other

## 2021-10-13 ENCOUNTER — Encounter (HOSPITAL_COMMUNITY): Payer: Medicaid Other

## 2021-10-15 ENCOUNTER — Encounter (HOSPITAL_COMMUNITY): Payer: Medicaid Other

## 2021-10-17 ENCOUNTER — Encounter (HOSPITAL_COMMUNITY): Payer: Medicaid Other

## 2021-10-20 ENCOUNTER — Ambulatory Visit (HOSPITAL_COMMUNITY): Payer: Medicare Other

## 2021-10-20 ENCOUNTER — Other Ambulatory Visit: Payer: Self-pay

## 2021-10-20 ENCOUNTER — Encounter (HOSPITAL_COMMUNITY): Payer: Self-pay

## 2021-10-20 ENCOUNTER — Ambulatory Visit (HOSPITAL_COMMUNITY)
Admission: RE | Admit: 2021-10-20 | Discharge: 2021-10-20 | Disposition: A | Discharge status: Home / Self Care | Payer: Medicare Other | Source: Ambulatory Visit | Attending: Internal Medicine | Admitting: Internal Medicine

## 2021-10-20 DIAGNOSIS — I6349 Cerebral infarction due to embolism of other cerebral artery: Secondary | ICD-10-CM | POA: Diagnosis present

## 2021-10-20 DIAGNOSIS — C342 Malignant neoplasm of middle lobe, bronchus or lung: Secondary | ICD-10-CM | POA: Insufficient documentation

## 2021-10-20 DIAGNOSIS — E039 Hypothyroidism, unspecified: Secondary | ICD-10-CM | POA: Diagnosis not present

## 2021-10-20 MED ORDER — SODIUM CHLORIDE (PF) 0.9 % IJ SOLN
INTRAMUSCULAR | Status: AC
  Filled 2021-10-20: qty 50

## 2021-10-20 MED ORDER — IOHEXOL 9 MG/ML PO SOLN
1000.0000 mL | ORAL | Status: AC
  Administered 2021-10-20: 1000 mL via ORAL

## 2021-10-20 MED ORDER — IOHEXOL 300 MG/ML  SOLN
100.0000 mL | Freq: Once | INTRAMUSCULAR | Status: AC | PRN
  Administered 2021-10-20: 100 mL via INTRAVENOUS

## 2021-10-20 MED ORDER — IOHEXOL 9 MG/ML PO SOLN
ORAL | Status: AC
  Filled 2021-10-20: qty 1000

## 2021-10-21 ENCOUNTER — Encounter: Payer: Self-pay | Admitting: Cardiology

## 2021-10-22 NOTE — Progress Notes (Signed)
CT scan of the chest and abdomen 10/20/2021: ?Heart is normal in size, mild coronary artery calcification.  The aorta and pulmonary trunk are normal in caliber. ?Emphysematous changes are present in the lungs and apical pleural blebs are noted bilaterally. ?Aortic atherosclerosis.  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 ?1.3 x 0.8 cm spiculated nodule right middle lobe not changed from previous exam.

## 2021-10-22 NOTE — Telephone Encounter (Signed)
Pt from

## 2021-10-23 ENCOUNTER — Telehealth: Payer: Self-pay | Admitting: Cardiology

## 2021-10-23 NOTE — Telephone Encounter (Signed)
A recent Abdominal CT has been uploaded that pt is asking you to review and to discuss findings with him before his follow-up appt with you 01/23/22. ?

## 2021-10-24 ENCOUNTER — Encounter: Payer: Self-pay | Admitting: Cardiology

## 2021-10-25 ENCOUNTER — Other Ambulatory Visit: Payer: Self-pay | Admitting: Student

## 2021-10-30 ENCOUNTER — Encounter: Payer: Self-pay | Admitting: Cardiology

## 2021-10-30 ENCOUNTER — Other Ambulatory Visit: Payer: Self-pay | Admitting: Student

## 2021-11-18 NOTE — Telephone Encounter (Signed)
Phone call general questions. ?

## 2022-01-12 ENCOUNTER — Other Ambulatory Visit (HOSPITAL_COMMUNITY): Payer: Self-pay | Admitting: Internal Medicine

## 2022-01-12 ENCOUNTER — Other Ambulatory Visit: Payer: Self-pay | Admitting: Internal Medicine

## 2022-01-12 DIAGNOSIS — C342 Malignant neoplasm of middle lobe, bronchus or lung: Secondary | ICD-10-CM

## 2022-01-12 DIAGNOSIS — C770 Secondary and unspecified malignant neoplasm of lymph nodes of head, face and neck: Secondary | ICD-10-CM

## 2022-01-23 ENCOUNTER — Ambulatory Visit: Payer: Medicaid Other | Admitting: Cardiology

## 2022-01-23 ENCOUNTER — Encounter: Payer: Self-pay | Admitting: Cardiology

## 2022-01-23 VITALS — BP 103/60 | HR 88 | Temp 97.6°F | Resp 16 | Ht 67.5 in | Wt 183.6 lb

## 2022-01-23 DIAGNOSIS — E7800 Pure hypercholesterolemia, unspecified: Secondary | ICD-10-CM

## 2022-01-23 DIAGNOSIS — I25118 Atherosclerotic heart disease of native coronary artery with other forms of angina pectoris: Secondary | ICD-10-CM

## 2022-01-23 DIAGNOSIS — R739 Hyperglycemia, unspecified: Secondary | ICD-10-CM

## 2022-01-23 DIAGNOSIS — F17200 Nicotine dependence, unspecified, uncomplicated: Secondary | ICD-10-CM

## 2022-01-23 DIAGNOSIS — R600 Localized edema: Secondary | ICD-10-CM

## 2022-01-23 MED ORDER — NICOTINE 14 MG/24HR TD PT24: 14.0000 mg | MEDICATED_PATCH | Freq: Every day | TRANSDERMAL | 0 refills | Status: DC

## 2022-01-23 MED ORDER — NICOTINE 21 MG/24HR TD PT24: 21.0000 mg | MEDICATED_PATCH | Freq: Every day | TRANSDERMAL | 0 refills | Status: AC

## 2022-01-23 MED ORDER — BUPROPION HCL ER (SR) 150 MG PO TB12: 150.0000 mg | ORAL_TABLET | Freq: Two times a day (BID) | ORAL | 2 refills | Status: DC

## 2022-01-23 MED ORDER — NICOTINE 7 MG/24HR TD PT24: 7.0000 mg | MEDICATED_PATCH | Freq: Every day | TRANSDERMAL | 0 refills | Status: DC

## 2022-01-23 NOTE — Progress Notes (Signed)
Primary Physician/Referring:  Patient, No Pcp Per  Patient ID: Jacob [name], male    DOB: 02/10/1971, 51 y.o.   MRN: [id number]  Chief Complaint  Patient presents with   Coronary Artery Disease   Hypertension   Follow-up    6 months   HPI:    Jacob [name]  is a 51 y.o. [place] male patient with history of hyperlipidemia, hyperglycemia, active smoker, history of cardioembolic stroke in June 0388. 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 He has hypothyroidism for which he takes Synthroid which has been managed by his oncologist.  History of anterolateral STEMI/2022 with subsequent stenting to proximal LAD and LVEF 45-50% at that time.  This is a 52-monthoffice visit, is presently asymptomatic without recurrence of angina pectoris.  Unfortunately still smoking about 1/2 pack of cigarettes a day.  Denies symptoms of claudication, dizziness or syncope.  Past Medical History:  Diagnosis Date   [place]    [place] [facility]    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    Past Surgical History:  Procedure Laterality Date   CARDIAC CATHETERIZATION     LEFT HEART CATH AND CORONARY ANGIOGRAPHY N/A 07/01/2021   Procedure: LEFT HEART CATH AND CORONARY ANGIOGRAPHY;  Surgeon: GAdrian Prows MD;  [facility];  Service: Cardiovascular;  Laterality: N/A;   TESTICLE SURGERY     Family History  Problem Relation Age of Onset   Heart disease Mother    Diabetes Mother     Social History   Tobacco Use   Smoking status: Every Day    Packs/day: 0.25    Years: 40.00    Total pack years: 10.00    Types: Cigarettes   Smokeless tobacco: Never   Tobacco comments:    Given phone number to [place] quit line encouraged smoking cessation  Substance Use Topics   Alcohol use: No   Marital Status: Single   ROS  Review of Systems  Cardiovascular:  Positive for leg  swelling (Chronic right leg). Negative for chest pain and dyspnea on exertion.    Objective  Blood pressure 103/60, pulse 88, temperature 97.6 F (36.4 C), temperature source Temporal, resp. rate 16, height 5' 7.5" (1.715 m), weight 183 lb 9.6 oz (83.3 kg), SpO2 96 %.     01/23/2022    9:57 AM 09/15/2021    2:26 PM 08/19/2021   10:30 AM  Vitals with BMI  Height 5' 7.5" 5' 7.5" 5' 7.75"  Weight 183 lbs 10 oz 182 lbs 179 lbs 7 oz  BMI 28.31 [phone] Systolic [id number] Diastolic 60 75 68  Pulse 88 91 70      Physical Exam Vitals reviewed.  Cardiovascular:     Rate and Rhythm: Normal rate and regular rhythm.     Pulses: Intact distal pulses.          Radial pulses are 2+ on the right side and 2+ on the left side.     Heart sounds: S1 normal and S2 normal. No murmur heard.    No gallop.  Pulmonary:     Effort: Pulmonary effort is normal. No respiratory distress.     Breath sounds: No wheezing, rhonchi or rales.  Abdominal:     General: Bowel sounds are normal.     Palpations: Abdomen is soft.  Musculoskeletal:     Right lower leg: Edema (2+  pitting below knee) present.     Left lower leg: No edema.     Laboratory examination:   Recent Labs    07/01/21 1329 07/01/21 1338 07/02/21 0110 07/03/21 2014 08/06/21 0944  NA 136   < > 137 135 140  K 3.8   < > 4.0 4.0 4.6  CL 109   < > 104 107 105  CO2 16*  --  26 22 22   GLUCOSE 118*   < > 122* 85 92  BUN 20   < > 16 17 15   CREATININE 1.38*   < > 1.21 1.40* 1.54*  CALCIUM 8.4*  --  8.5* 8.3* 9.0  GFRNONAA >60  --  >60 >60  --    < > = values in this interval not displayed.   CrCl cannot be calculated (Patient's most recent lab result is older than the maximum 21 days allowed.).     Latest Ref Rng & Units 08/06/2021    9:44 AM 07/03/2021    8:14 PM 07/02/2021    1:10 AM  CMP  Glucose 70 - 99 mg/dL 92  85  122   BUN 6 - 24 mg/dL 15  17  16    Creatinine 0.76 - 1.27 mg/dL 1.54  1.40  1.21   Sodium 134 - 144 mmol/L  140  135  137   Potassium 3.5 - 5.2 mmol/L 4.6  4.0  4.0   Chloride 96 - 106 mmol/L 105  107  104   CO2 20 - 29 mmol/L 22  22  26    Calcium 8.7 - 10.2 mg/dL 9.0  8.3  8.5       Latest Ref Rng & Units 08/06/2021    9:44 AM 07/03/2021    8:14 PM 07/02/2021    1:10 AM  CBC  WBC 3.4 - 10.8 x10E3/uL 9.2  11.5  13.0   Hemoglobin 13.0 - 17.7 g/dL 14.6  14.2  13.6   Hematocrit 37.5 - 51.0 % 43.5  42.4  40.6   Platelets 150 - 450 x10E3/uL 241  234  239     Lipid Panel Recent Labs    07/01/21 1329  CHOL 155  TRIG 82  LDLCALC 105*  VLDL 16  HDL 34*  CHOLHDL 4.6    HEMOGLOBIN A1C Lab Results  Component Value Date   HGBA1C 6.3 (H) 07/01/2021   MPG 134.11 07/01/2021   TSH Recent Labs    07/02/21 0110  TSH 14.888*   External labs:  Labs 09/01/2021:  BUN 18, creatinine 1.40, EGFR 61 mL, potassium 4.2, ALT minimally elevated at 148, otherwise ALT and AST are normal.  Hb 14.6/HCT 44.0, platelets 217, normal indicis.  Allergies  No Known Allergies  Final Medications at End of Visit    Current Outpatient Medications:    atorvastatin (LIPITOR) 80 MG tablet, TAKE 1 TABLET BY MOUTH EVERY DAY, Disp: 90 tablet, Rfl: 1   BAYER LOW DOSE 81 MG chewable tablet, Chew 81 mg by mouth daily., Disp: , Rfl:    BRILINTA 90 MG TABS tablet, TAKE 1 TABLET BY MOUTH 2 TIMES DAILY., Disp: 60 tablet, Rfl: 3   buPROPion (WELLBUTRIN SR) 150 MG 12 hr tablet, Take 1 tablet (150 mg total) by mouth 2 times daily at 12 noon and 4 pm. 1 tablet morning and 4 pm, Disp: 60 tablet, Rfl: 2   famotidine (PEPCID) 20 MG tablet, Take 1 mg by mouth daily., Disp: , Rfl:    levothyroxine (SYNTHROID) 125 MCG tablet,  Take 125 mcg by mouth daily before breakfast., Disp: , Rfl:    losartan (COZAAR) 25 MG tablet, Take 0.5 tablets (12.5 mg total) by mouth daily after supper., Disp: 90 tablet, Rfl: 3   metoprolol succinate (TOPROL-XL) 25 MG 24 hr tablet, TAKE 1 TABLET (25 MG TOTAL) BY MOUTH DAILY., Disp: 90 tablet, Rfl: 1    [START ON 02/16/2022] nicotine (NICODERM CQ) 14 mg/24hr patch, Place 1 patch (14 mg total) onto the skin daily for 28 days., Disp: 28 patch, Rfl: 0   nicotine (NICODERM CQ) 21 mg/24hr patch, Place 1 patch (21 mg total) onto the skin daily for 28 days., Disp: 28 patch, Rfl: 0   [START ON 03/23/2022] nicotine (NICODERM CQ) 7 mg/24hr patch, Place 1 patch (7 mg total) onto the skin daily., Disp: 28 patch, Rfl: 0   nitroGLYCERIN (NITROSTAT) 0.4 MG SL tablet, Place 1 tablet (0.4 mg total) under the tongue every 5 (five) minutes as needed for chest pain., Disp: 30 tablet, Rfl: 12   TABRECTA 200 MG tablet, Take 400 mg by mouth in the morning and at bedtime., Disp: , Rfl:    Radiology:   DG Chest Portable 07/01/2021 The heart size and mediastinal contours are within normal limits. Both lungs are clear. The visualized skeletal structures are unremarkable. Impression: No active disease  CT scan of the chest and abdomen 10/20/2021: Heart is normal in size, mild coronary artery calcification.  The aorta and pulmonary trunk are normal in caliber. Emphysematous changes are present in the lungs and apical pleural blebs are noted bilaterally. Aortic atherosclerosis.  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 1.3 x 0.8 cm spiculated nodule right middle lobe not changed from previous exam.  Cardiac Studies:   Lower Extremity Venous Duplex  10/31/2020: - There is no evidence of deep vein thrombosis in the lower extremity.  However, portions of this examination were limited- see technologist  comments above.  LEFT:  - There is no evidence of deep vein thrombosis in the lower extremity.  However, portions of this examination were limited- see technologist comments above.   Left Heart Catheterization 07/01/21:  LV: 141/21, EDP 36 mmHg.  Ao 150/95, mean 118 mmHg.  There was no pressure gradient across the aortic valve. LM: Large vessel. LAD: Thrombotic/ulcerated proximal LAD 80  to 90% stenosis with TIMI II flow.  Apical LAD diffusely diseased.  Direct stenting ONYX FRONTIER 3.5X30 mm DES. Maximum pressure: 16 atm. Inflation time: 45 sec. Stent strut is well apposed.  TIMI II to TIMI-3 flow at the end of the procedure, distal embolization of the apical LAD of no clinical consequence with occlusion.  It is also diffusely diseased at the apex. CX: Very large-caliber vessel.  Mid segment has a 40% stenosis. 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  There are contralateral collaterals noted and ipsilateral collaterals noted to the distal right coronary artery.  The artery is flush occluded after the RV branch, I did try to open up the vessel however unable to obtain access to the true lumen.  However with the attempted angioplasty, contralateral flow improved all the way back to the proximal segment of the RCA from the LAD. Small to moderate sized RCA and severely diffusely diseased.   Echocardiogram 07/01/2021: 1. Left ventricular ejection fraction, by estimation, is 45 to 50%. The left ventricle has mildly decreased function. The left ventricle  demonstrates regional wall motion abnormalities, There is akinesis of the  left  ventricular, apical inferoseptal wall and anteroseptal wall. Left ventricular diastolic parameters were normal.   2. Right ventricular systolic function is normal. The right ventricular size is normal.  3. The mitral valve is normal in structure. No evidence of mitral valve regurgitation. No evidence of mitral stenosis.  4. The aortic valve is normal in structure. Aortic valve regurgitation is not visualized. No aortic stenosis is present.  EKG:  EKG 01/23/2022: Normal sinus rhythm at the rate of 83 bpm, incomplete right bundle branch block.  Otherwise normal EKG. no significant change from 09/15/2021.  Assessment     ICD-10-CM   1. Coronary artery disease of native artery of native heart with stable angina pectoris  (HCC)  I25.118 EKG 12-Lead    nicotine (NICODERM CQ) 21 mg/24hr patch    nicotine (NICODERM CQ) 14 mg/24hr patch    nicotine (NICODERM CQ) 7 mg/24hr patch    Lipoprotein A (LPA)    Lipid Panel With LDL/HDL Ratio    2. Pure hypercholesterolemia  E78.00 Lipoprotein A (LPA)    Lipid Panel With LDL/HDL Ratio    3. Hyperglycemia  R73.9     4. Tobacco use disorder  F17.200 nicotine (NICODERM CQ) 21 mg/24hr patch    nicotine (NICODERM CQ) 14 mg/24hr patch    nicotine (NICODERM CQ) 7 mg/24hr patch    buPROPion (WELLBUTRIN SR) 150 MG 12 hr tablet    5. Leg edema, right  R60.0       There are no discontinued medications.  Meds ordered this encounter  Medications   nicotine (NICODERM CQ) 21 mg/24hr patch    Sig: Place 1 patch (21 mg total) onto the skin daily for 28 days.    Dispense:  28 patch    Refill:  0   nicotine (NICODERM CQ) 14 mg/24hr patch    Sig: Place 1 patch (14 mg total) onto the skin daily for 28 days.    Dispense:  28 patch    Refill:  0   nicotine (NICODERM CQ) 7 mg/24hr patch    Sig: Place 1 patch (7 mg total) onto the skin daily.    Dispense:  28 patch    Refill:  0   buPROPion (WELLBUTRIN SR) 150 MG 12 hr tablet    Sig: Take 1 tablet (150 mg total) by mouth 2 times daily at 12 noon and 4 pm. 1 tablet morning and 4 pm    Dispense:  60 tablet    Refill:  2   Recommendations:   Jacob [name] is a 51 y.o. Caucasian male patient with history of hyperlipidemia, hyperglycemia, active smoker, history of cardioembolic stroke in June 9371. 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 He has hypothyroidism for which he takes Synthroid which has been managed by his oncologist.  History of anterolateral STEMI/2022 with subsequent stenting to proximal LAD and LVEF 45-50% at that time.  Patient is here on a 33-monthoffice visit on follow-up, fortunately  he has not had any recurrence of angina pectoris.  He has resumed all his activities without any mutations.  Denies any symptoms of claudication.  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  Patient presently on aspirin  and Brilinta, advised him that if he continues to smoke he is extremely high risk for progression of thrombotic complications involving the abdominal aorta and embolic complications as well.  He appears to be motivated to smoking cessation, I prescribed him Wellbutrin and also prescribed him nicotine patches.  He needs lipid profile testing along with LPA.  He is presently fasting.  External labs reviewed, normal renal function and normal CBC.  I will see him back in 6 months or sooner if problems, could could discontinue Brilinta on his next office visit.  I spent additional 6 minutes in discussions regarding smoking cessation and the effects of smoking on thrombotic complications.   Adrian Prows, PA-C 01/23/2022, 10:50 AM Office: [phone]

## 2022-01-24 LAB — LIPID PANEL WITH LDL/HDL RATIO
Cholesterol, Total: 146 mg/dL (ref 100–199)
HDL: 41 mg/dL (ref >39)
LDL Chol Calc (NIH): 87 mg/dL (ref 0–99)
LDL/HDL Ratio: 2.1 ratio (ref 0.0–3.6)
Triglycerides: 99 mg/dL (ref 0–149)
VLDL Cholesterol Cal: 18 mg/dL (ref 5–40)

## 2022-01-24 LAB — LIPOPROTEIN A (LPA): Lipoprotein (a): 180.8 nmol/L — ABNORMAL HIGH (ref <75.0)

## 2022-01-28 ENCOUNTER — Telehealth: Payer: Self-pay | Admitting: Cardiology

## 2022-01-28 DIAGNOSIS — I25118 Atherosclerotic heart disease of native coronary artery with other forms of angina pectoris: Secondary | ICD-10-CM

## 2022-01-28 DIAGNOSIS — E7800 Pure hypercholesterolemia, unspecified: Secondary | ICD-10-CM

## 2022-01-28 MED ORDER — EZETIMIBE 10 MG PO TABS: 10.0000 mg | ORAL_TABLET | Freq: Every evening | ORAL | 3 refills | Status: DC

## 2022-01-28 NOTE — Telephone Encounter (Signed)
ICD-10-CM   1. Coronary artery disease of native artery of native heart with stable angina pectoris (HCC)  I25.118 ezetimibe (ZETIA) 10 MG tablet    Lipid Panel With LDL/HDL Ratio    2. Pure hypercholesterolemia  E78.00 ezetimibe (ZETIA) 10 MG tablet    Lipid Panel With LDL/HDL Ratio     Orders Placed This Encounter  Procedures   Lipid Panel With LDL/HDL Ratio    Standing Status:   Future    Standing Expiration Date:   01/29/2023    Meds ordered this encounter  Medications   ezetimibe (ZETIA) 10 MG tablet    Sig: Take 1 tablet (10 mg total) by mouth every evening.    Dispense:  90 tablet    Refill:  3

## 2022-02-18 ENCOUNTER — Other Ambulatory Visit: Payer: Self-pay | Admitting: Cardiology

## 2022-02-18 DIAGNOSIS — F17200 Nicotine dependence, unspecified, uncomplicated: Secondary | ICD-10-CM

## 2022-02-18 NOTE — Telephone Encounter (Signed)
Refill request

## 2022-02-27 ENCOUNTER — Other Ambulatory Visit: Payer: Self-pay | Admitting: Cardiology

## 2022-02-27 ENCOUNTER — Telehealth: Payer: Self-pay | Admitting: Cardiology

## 2022-02-27 ENCOUNTER — Other Ambulatory Visit: Payer: Self-pay | Admitting: Student

## 2022-02-27 DIAGNOSIS — F17200 Nicotine dependence, unspecified, uncomplicated: Secondary | ICD-10-CM

## 2022-02-27 DIAGNOSIS — I25118 Atherosclerotic heart disease of native coronary artery with other forms of angina pectoris: Secondary | ICD-10-CM

## 2022-02-27 NOTE — Telephone Encounter (Signed)
Refill request

## 2022-02-27 NOTE — Telephone Encounter (Signed)
Patient says he went to [facility], and they didn't have his brilinta refills. Please put in his refill for brilinta.

## 2022-02-27 NOTE — Telephone Encounter (Signed)
Medication has been refilled and sent to [facility]

## 2022-03-19 ENCOUNTER — Encounter (HOSPITAL_COMMUNITY): Payer: Self-pay

## 2022-03-19 ENCOUNTER — Ambulatory Visit (HOSPITAL_COMMUNITY): Payer: Medicare Other

## 2022-03-19 ENCOUNTER — Other Ambulatory Visit (HOSPITAL_COMMUNITY): Payer: Self-pay | Admitting: Internal Medicine

## 2022-03-19 ENCOUNTER — Ambulatory Visit (HOSPITAL_COMMUNITY)
Admission: RE | Admit: 2022-03-19 | Discharge: 2022-03-19 | Disposition: A | Discharge status: Home / Self Care | Payer: Medicare Other | Source: Ambulatory Visit | Attending: Internal Medicine | Admitting: Internal Medicine

## 2022-03-19 DIAGNOSIS — C770 Secondary and unspecified malignant neoplasm of lymph nodes of head, face and neck: Secondary | ICD-10-CM | POA: Insufficient documentation

## 2022-03-19 DIAGNOSIS — C342 Malignant neoplasm of middle lobe, bronchus or lung: Secondary | ICD-10-CM

## 2022-03-19 MED ORDER — IOHEXOL 9 MG/ML PO SOLN
ORAL | Status: AC
  Filled 2022-03-19: qty 1000

## 2022-03-19 MED ORDER — SODIUM CHLORIDE (PF) 0.9 % IJ SOLN
INTRAMUSCULAR | Status: AC
  Filled 2022-03-19: qty 50

## 2022-03-19 MED ORDER — IOHEXOL 300 MG/ML  SOLN
100.0000 mL | Freq: Once | INTRAMUSCULAR | Status: AC | PRN
  Administered 2022-03-19: 100 mL via INTRAVENOUS

## 2022-03-19 MED ORDER — IOHEXOL 9 MG/ML PO SOLN: 500.0000 mL | ORAL | Status: AC

## 2022-03-23 ENCOUNTER — Other Ambulatory Visit (HOSPITAL_COMMUNITY): Payer: Self-pay | Admitting: Internal Medicine

## 2022-03-23 DIAGNOSIS — C3490 Malignant neoplasm of unspecified part of unspecified bronchus or lung: Secondary | ICD-10-CM

## 2022-04-29 ENCOUNTER — Other Ambulatory Visit: Payer: Self-pay | Admitting: Cardiology

## 2022-04-30 ENCOUNTER — Ambulatory Visit: Payer: Medicare Other | Admitting: Family Medicine

## 2022-04-30 ENCOUNTER — Other Ambulatory Visit: Payer: Self-pay

## 2022-04-30 MED ORDER — ATORVASTATIN CALCIUM 80 MG PO TABS: 80.0000 mg | ORAL_TABLET | Freq: Every day | ORAL | 1 refills | Status: DC

## 2022-06-02 ENCOUNTER — Telehealth: Payer: Self-pay

## 2022-06-02 ENCOUNTER — Emergency Department (HOSPITAL_COMMUNITY): Payer: Medicare Other

## 2022-06-02 ENCOUNTER — Emergency Department (HOSPITAL_COMMUNITY)
Admission: EM | Admit: 2022-06-02 | Discharge: 2022-06-02 | Discharge status: Against Medical Advice | Payer: Medicare Other | Attending: Emergency Medicine | Admitting: Emergency Medicine

## 2022-06-02 ENCOUNTER — Encounter (HOSPITAL_COMMUNITY): Payer: Self-pay | Admitting: Emergency Medicine

## 2022-06-02 ENCOUNTER — Encounter: Payer: Self-pay | Admitting: Cardiology

## 2022-06-02 ENCOUNTER — Other Ambulatory Visit: Payer: Self-pay

## 2022-06-02 DIAGNOSIS — Z5321 Procedure and treatment not carried out due to patient leaving prior to being seen by health care provider: Secondary | ICD-10-CM | POA: Insufficient documentation

## 2022-06-02 DIAGNOSIS — R42 Dizziness and giddiness: Secondary | ICD-10-CM | POA: Insufficient documentation

## 2022-06-02 DIAGNOSIS — R079 Chest pain, unspecified: Secondary | ICD-10-CM | POA: Diagnosis present

## 2022-06-02 LAB — CBC WITH DIFFERENTIAL/PLATELET
Abs Immature Granulocytes: 0.07 10*3/uL (ref 0.00–0.07)
Basophils Absolute: 0 10*3/uL (ref 0.0–0.1)
Basophils Relative: 0 %
Eosinophils Absolute: 0.2 10*3/uL (ref 0.0–0.5)
Eosinophils Relative: 2 %
HCT: 44 % (ref 39.0–52.0)
Hemoglobin: 14.3 g/dL (ref 13.0–17.0)
Immature Granulocytes: 1 %
Lymphocytes Relative: 13 %
Lymphs Abs: 1.5 10*3/uL (ref 0.7–4.0)
MCH: 30.3 pg (ref 26.0–34.0)
MCHC: 32.5 g/dL (ref 30.0–36.0)
MCV: 93.2 fL (ref 80.0–100.0)
Monocytes Absolute: 0.8 10*3/uL (ref 0.1–1.0)
Monocytes Relative: 7 %
Neutro Abs: 8.8 10*3/uL — ABNORMAL HIGH (ref 1.7–7.7)
Neutrophils Relative %: 77 %
Platelets: 215 10*3/uL (ref 150–400)
RBC: 4.72 MIL/uL (ref 4.22–5.81)
RDW: 14.6 % (ref 11.5–15.5)
WBC: 11.5 10*3/uL — ABNORMAL HIGH (ref 4.0–10.5)
nRBC: 0 % (ref 0.0–0.2)

## 2022-06-02 LAB — TROPONIN I (HIGH SENSITIVITY)
Troponin I (High Sensitivity): 4 ng/L (ref <18)
Troponin I (High Sensitivity): 4 ng/L (ref <18)

## 2022-06-02 LAB — BASIC METABOLIC PANEL
Anion gap: 5 (ref 5–15)
BUN: 25 mg/dL — ABNORMAL HIGH (ref 6–20)
CO2: 23 mmol/L (ref 22–32)
Calcium: 8.5 mg/dL — ABNORMAL LOW (ref 8.9–10.3)
Chloride: 109 mmol/L (ref 98–111)
Creatinine, Ser: 1.41 mg/dL — ABNORMAL HIGH (ref 0.61–1.24)
GFR, Estimated: 60 mL/min — ABNORMAL LOW (ref >60)
Glucose, Bld: 115 mg/dL — ABNORMAL HIGH (ref 70–99)
Potassium: 4.3 mmol/L (ref 3.5–5.1)
Sodium: 137 mmol/L (ref 135–145)

## 2022-06-02 NOTE — ED Notes (Signed)
This patient did not want to have his vitals taken again

## 2022-06-02 NOTE — ED Notes (Signed)
This patient had this EMT to take his IV. The patient decided that he wanted to leave without being seen. This EMT informed the patient to seek medical attention if his symptoms worsen.

## 2022-06-02 NOTE — Telephone Encounter (Signed)
I sent them mychart message

## 2022-06-02 NOTE — ED Triage Notes (Signed)
Patient here with chest pain, no shortness of breath.  Patient administered his own nitro (x2) at home for his chest pain.  Patient with extensive cardiac history.  No nausea no vomiting.  Some dizziness after the nitro administration.

## 2022-06-02 NOTE — ED Provider Triage Note (Signed)
Emergency Medicine Provider Triage Evaluation Note  Jacob [name] , a 51 y.o. male  was evaluated in triage.  Pt complains of chest pain. States symptoms began this evening around 1:30am. Began having mild heart burn. States that he got up and took some medication and laid back down but symptoms persisted. Because he was having chest pain he took nitroglycerin which did not improve symptoms. States he felt lightheaded and was concerned given STEMI in December so called EMS. Currently still having neck pain and chest discomfort. Denies palpitations, shortness of breath, syncope, cough or fever  Review of Systems  Positive: See above Negative:   Physical Exam  BP 101/67 (BP Location: Right Arm)   Pulse 72   Temp (!) 97.3 F (36.3 C) (Oral)   Resp 17   Ht 5\' 8"  (1.727 m)   Wt 78 kg   SpO2 96%   BMI 26.15 kg/m  Gen:   Awake, no distress   Resp:  Normal effort  MSK:   Moves extremities without difficulty  Other:    Medical Decision Making  Medically screening exam initiated at 4:16 AM.  Appropriate orders placed.  Dominik Oluwatimilehin Balfour was informed that the remainder of the evaluation will be completed by another provider, this initial triage assessment does not replace that evaluation, and the importance of remaining in the ED until their evaluation is complete.     Mickie Hillier, PA-C 06/02/22 [phone]

## 2022-06-04 ENCOUNTER — Ambulatory Visit: Payer: Medicare Other | Admitting: Cardiology

## 2022-06-04 NOTE — Progress Notes (Deleted)
Primary Physician/Referring:  Jacob [name], No Pcp Per  Jacob [name] ID: Jacob Jacob [name], male    DOB: 06/30/1971, 51 y.o.   MRN: [id number]  No chief complaint on file.  HPI:    Jacob Jacob [name]  is a 51 y.o. [place] male Jacob [name] with history of hyperlipidemia, hyperglycemia, active smoker, history of cardioembolic stroke in June 1093. 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 He has hypothyroidism for which he takes Synthroid which has been managed by his oncologist.  History of anterolateral STEMI/2022 with subsequent stenting to proximal LAD and LVEF 45-50% at that time.  Jacob [name] for urgent visit at his request today given mild constant central chest pain ongoing for the last 3 days.  Jacob [name] has taken 2 nitroglycerin tablets without relief of symptoms.  States symptoms are not worse with exertion and are not limiting his daily activities.  Denies shortness of breath or associated symptoms.  Jacob [name] is not taking losartan, and his blood pressure remains soft.  Jacob [name] does continue to smoke "a couple cigarettes per day".  Past Medical History:  Diagnosis Date   Lung cancer ([place])    Stroke ([place])    Testicle cancer [facility])    Past Surgical History:  Procedure Laterality Date   CARDIAC CATHETERIZATION     LEFT HEART CATH AND CORONARY ANGIOGRAPHY N/A 07/01/2021   Procedure: LEFT HEART CATH AND CORONARY ANGIOGRAPHY;  Surgeon: Adrian Prows, MD;  Location: [place] [facility];  Service: Cardiovascular;  Laterality: N/A;   TESTICLE SURGERY     Family History  Problem Relation Age of Onset   Heart disease Mother    Diabetes Mother     Social History   Tobacco Use   Smoking status: Every Day    Packs/day: 0.25    Years: 40.00    Total pack years: 10.00    Types: Cigarettes   Smokeless tobacco: Never   Tobacco comments:    Given phone number to [place]  quit line encouraged smoking cessation  Substance Use Topics   Alcohol use: No   Marital Status: Single   ROS  Review of Systems  Cardiovascular:  Positive for chest pain and dyspnea on exertion (mild, stable). Negative for claudication, leg swelling, near-syncope, orthopnea, palpitations, paroxysmal nocturnal dyspnea and syncope.  Neurological:  Negative for dizziness.    Objective  There were no vitals taken for this visit.     06/02/2022    4:04 AM 06/02/2022    4:03 AM 01/23/2022    9:57 AM  Vitals with BMI  Height _0   5' 7.5"  Weight 172 lbs  183 lbs 10 oz  BMI 23.55  73.22  Systolic  025 427  Diastolic  67 60  Pulse  72 88      Physical Exam Vitals reviewed.  Cardiovascular:     Rate and Rhythm: Normal rate and regular rhythm.     Pulses: Intact distal pulses.          Radial pulses are 2+ on the right side and 2+ on the left side.     Heart sounds: S1 normal and S2 normal. No murmur heard.    No gallop.  Pulmonary:     Effort: Pulmonary effort is normal. No respiratory distress.     Breath sounds: No wheezing, rhonchi or rales.  Musculoskeletal:     Right lower leg: Edema (trtace) present.  Left lower leg: Edema (trace) present.     Laboratory examination:   Recent Labs    07/02/21 0110 07/03/21 2014 08/06/21 0944 06/02/22 0417  NA 137 135 140 137  K 4.0 4.0 4.6 4.3  CL 104 107 105 109  CO2 _0 GLUCOSE 122* 85 92 115*  BUN _1 25*  CREATININE 1.21 1.40* 1.54* 1.41*  CALCIUM 8.5* 8.3* 9.0 8.5*  GFRNONAA >60 >60  --  60*    estimated creatinine clearance is 59.3 mL/min (A) (by C-G formula based on SCr of 1.41 mg/dL (H)).     Latest Ref Rng & Units 06/02/2022    4:17 AM 08/06/2021    9:44 AM 07/03/2021    8:14 PM  CMP  Glucose 70 - 99 mg/dL 115  92  85   BUN 6 - 20 mg/dL _2 Creatinine 0.61 - 1.24 mg/dL 1.41  1.54  1.40   Sodium 135 - 145 mmol/L 137  140  135   Potassium 3.5 - 5.1 mmol/L 4.3  4.6  4.0   Chloride 98  - 111 mmol/L 109  105  107   CO2 22 - 32 mmol/L _3 Calcium 8.9 - 10.3 mg/dL 8.5  9.0  8.3       Latest Ref Rng & Units 06/02/2022    4:17 AM 08/06/2021    9:44 AM 07/03/2021    8:14 PM  CBC  WBC 4.0 - 10.5 K/uL 11.5  9.2  11.5   Hemoglobin 13.0 - 17.0 g/dL 14.3  14.6  14.2   Hematocrit 39.0 - 52.0 % 44.0  43.5  42.4   Platelets 150 - 400 K/uL 215  241  234     Lipid Panel Recent Labs    07/01/21 1329 01/23/22 1105  CHOL 155 146  TRIG 82 99  LDLCALC 105* 87  VLDL 16  --   HDL 34* 41  CHOLHDL 4.6  --      HEMOGLOBIN A1C Lab Results  Component Value Date   HGBA1C 6.3 (H) 07/01/2021   MPG 134.11 07/01/2021   TSH Recent Labs    07/02/21 0110  TSH 14.888*    External labs:   None   Allergies  No Known Allergies   Final Medications at End of Visit     Current Outpatient Medications:    atorvastatin (LIPITOR) 80 MG tablet, Take 1 tablet (80 mg total) by mouth daily., Disp: 90 tablet, Rfl: 1   BAYER LOW DOSE 81 MG chewable tablet, Chew 81 mg by mouth daily., Disp: , Rfl:    BRILINTA 90 MG TABS tablet, TAKE 1 TABLET BY MOUTH TWICE A DAY, Disp: 60 tablet, Rfl: 3   buPROPion (WELLBUTRIN SR) 150 MG 12 hr tablet, TAKE 1 TABLET (150 MG TOTAL) BY MOUTH 2 TIMES DAILY AT 12 NOON AND 4 PM. 1 TABLET MORNING AND 4 PM, Disp: 180 tablet, Rfl: 1   [employer] NICOTINE TRANSDERMAL SYS 14 MG/24HR patch, PLACE 1 PATCH (14 MG TOTAL) ONTO THE SKIN DAILY FOR 28 DAYS., Disp: 28 patch, Rfl: 0   ezetimibe (ZETIA) 10 MG tablet, Take 1 tablet (10 mg total) by mouth every evening., Disp: 90 tablet, Rfl: 3   famotidine (PEPCID) 20 MG tablet, Take 1 mg by mouth daily., Disp: , Rfl:    levothyroxine (SYNTHROID) 125 MCG tablet, Take 125 mcg by mouth daily before breakfast., Disp: , Rfl:  losartan (COZAAR) 25 MG tablet, Take 0.5 tablets (12.5 mg total) by mouth daily after supper., Disp: 90 tablet, Rfl: 3   metoprolol succinate (TOPROL-XL) 25 MG 24 hr tablet, TAKE 1 TABLET (25 MG TOTAL) BY  MOUTH DAILY., Disp: 90 tablet, Rfl: 1   nicotine (NICODERM CQ) 7 mg/24hr patch, Place 1 patch (7 mg total) onto the skin daily., Disp: 28 patch, Rfl: 0   nitroGLYCERIN (NITROSTAT) 0.4 MG SL tablet, Place 1 tablet (0.4 mg total) under the tongue every 5 (five) minutes as needed for chest pain., Disp: 30 tablet, Rfl: 12   TABRECTA 200 MG tablet, Take 400 mg by mouth in the morning and at bedtime., Disp: , Rfl:    Radiology:   DG Chest Portable 07/01/2021 The heart size and mediastinal contours are within normal limits. Both lungs are clear. The visualized skeletal structures are unremarkable. Impression: No active disease  CT scan of the chest and abdomen 10/20/2021: Heart is normal in size, mild coronary artery calcification.  The aorta and pulmonary trunk are normal in caliber. Emphysematous changes are present in the lungs and apical pleural blebs are noted bilaterally. Aortic atherosclerosis.  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 1.3 x 0.8 cm spiculated nodule right middle lobe not changed from previous exam.  Cardiac Studies:   Left Heart Catheterization 07/01/21:  LV: 141/21, EDP 36 mmHg.  Ao 150/95, mean 118 mmHg.  There was no pressure gradient across the aortic valve. LM: Large vessel. LAD: Thrombotic/ulcerated proximal LAD 80 to 90% stenosis with TIMI II flow.  Apical LAD diffusely diseased.  Direct stenting ONYX FRONTIER 3.5X30 mm DES. Maximum pressure: 16 atm. Inflation time: 45 sec. Stent strut is well apposed.  TIMI II to TIMI-3 flow at the end of the procedure, distal embolization of the apical LAD of no clinical consequence with occlusion.  It is also diffusely diseased at the apex. CX: Very large-caliber vessel.  Mid segment has a 40% stenosis. 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  There are contralateral collaterals noted and ipsilateral collaterals noted to the distal right coronary artery. The  artery is flush occluded after the RV branch, I did try to open up the vessel however unable to obtain access to the true lumen.  However with the attempted angioplasty, contralateral flow improved all the way back to the proximal segment of the RCA from the LAD. Small to moderate sized RCA and severely diffusely diseased.    07/01/2021: ONYX FRONTIER 3.5X30 mm DES prox LAD   Echocardiogram 07/01/2021:  1. Left ventricular ejection fraction, by estimation, is 45 to 50%. The left ventricle has mildly decreased function. The left ventricle demonstrates regional wall motion abnormalities (see scoring diagram/findings for description). Left ventricular diastolic parameters were normal. There is akinesis of the left ventricular, apical inferoseptal wall and anteroseptal wall.  2. Right ventricular systolic function is normal. The right ventricular size is normal.  3. The mitral valve is normal in structure. No evidence of mitral valve regurgitation. No evidence of mitral stenosis.  4. The aortic valve is normal in structure. Aortic valve regurgitation is not visualized. No aortic stenosis is present.  EKG:    *** 09/15/2021: Sinus rhythm at a rate of 67 bpm.  Normal axis.  No evidence of ischemia or underlying injury pattern.  07/02/2021: Sinus rhythm at a rate of 81 bpm.  Normal axis.  ST elevations of normalized.  07/01/2021 at 1316 hrs.: Normal  sinus rhythm at rate of 61 bpm, hyperacute T waves, inferior and anterolateral STEMI.  Assessment     ICD-10-CM   1. Chronic chest pain  R07.9    G89.29     2. Coronary artery disease of native artery of native heart with stable angina pectoris ([place])  I25.118     3. Pure hypercholesterolemia  E78.00     4. Hyperglycemia  R73.9     5. Tobacco use disorder  F17.200       There are no discontinued medications.  No orders of the defined types were placed in this encounter.  Recommendations:   Jacob Jacob [name] is a 51 y.o. Caucasian male  Jacob [name] with history of hyperlipidemia, hyperglycemia, active smoker, history of cardioembolic stroke in June 8590. 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 He has hypothyroidism for which he takes Synthroid which has been managed by his oncologist.  History of anterolateral STEMI/2022 with subsequent stenting to proximal LAD and LVEF 45-50% at that time.  Jacob [name] for urgent visit with complaints of chest pain.  Jacob [name]'s symptoms are fairly atypical and EKG is without ischemic changes.  Given Jacob [name]'s history I discussed case with Dr. Einar Gip who agrees that suspicion is low for ischemic etiology of Jacob [name]'s pain.  We will therefore obtain stat troponin, if this is normal recommend Jacob [name] follow-up with PCP for further evaluation of chest discomfort as it may be related to noncardiac etiology.  We will call Jacob [name] and advise that he go seek further evaluation if troponin is elevated.  Dr. Einar Gip agrees with this plan particularly given that Jacob [name]'s pain has been ongoing for the last 3 to 4 days.  Again reiterated to Jacob [name] the importance of smoking cessation and medication compliance.  Jacob [name] reports continued medication compliance.  Follow-up as previously scheduled with Dr. Einar Gip, unless troponins elevated or symptoms fail to improve.  Counseled Jacob [name] regarding signs and symptoms that would warrant urgent or emergent evaluation.  Jacob [name] was seen in collaboration with Dr. Einar Gip and he is in agreement with the plan.    Adrian Prows, PA-C 06/04/2022, 11:14 AM Office: [phone]

## 2022-06-08 ENCOUNTER — Ambulatory Visit: Payer: Medicare Other

## 2022-06-30 ENCOUNTER — Other Ambulatory Visit (HOSPITAL_COMMUNITY): Payer: Self-pay | Admitting: Internal Medicine

## 2022-06-30 DIAGNOSIS — C3490 Malignant neoplasm of unspecified part of unspecified bronchus or lung: Secondary | ICD-10-CM

## 2022-07-02 ENCOUNTER — Other Ambulatory Visit: Payer: Self-pay | Admitting: Cardiology

## 2022-07-03 ENCOUNTER — Other Ambulatory Visit: Payer: Self-pay

## 2022-07-13 ENCOUNTER — Encounter: Payer: Self-pay | Admitting: Cardiology

## 2022-07-13 ENCOUNTER — Ambulatory Visit: Payer: Medicare Other | Admitting: Cardiology

## 2022-07-13 VITALS — BP 114/73 | HR 73 | Ht 68.0 in | Wt 178.2 lb

## 2022-07-13 DIAGNOSIS — I25118 Atherosclerotic heart disease of native coronary artery with other forms of angina pectoris: Secondary | ICD-10-CM

## 2022-07-13 DIAGNOSIS — E7800 Pure hypercholesterolemia, unspecified: Secondary | ICD-10-CM

## 2022-07-13 DIAGNOSIS — E032 Hypothyroidism due to medicaments and other exogenous substances: Secondary | ICD-10-CM

## 2022-07-13 DIAGNOSIS — F17200 Nicotine dependence, unspecified, uncomplicated: Secondary | ICD-10-CM

## 2022-07-13 NOTE — Progress Notes (Signed)
Primary Physician/Referring:  Patient, No Pcp Per  Patient ID: Jacob [name], male    DOB: 21-Nov-1970, 51 y.o.   MRN: [id number]  Chief Complaint  Patient presents with   Coronary Artery Disease   Hyperlipidemia   Follow-up    74month  HPI:    Jacob [name] is a 51y.o. Caucasian male patient with history of hyperlipidemia, hyperglycemia, active smoker, history of cardioembolic stroke in June 24034and anterolateral STEMI in 2022 SP direct stenting to the LAD.  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  This is a 6 month OV and is presently asymptomatic without recurrence of angina pectoris.  Unfortunately still smoking about few cigarettes a day.  Denies symptoms of claudication, dizziness or syncope.  Past Medical History:  Diagnosis Date   [place]    [place]    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    Past Surgical History:  Procedure Laterality Date   CARDIAC CATHETERIZATION     LEFT HEART CATH AND CORONARY ANGIOGRAPHY N/A 07/01/2021   Procedure: LEFT HEART CATH AND CORONARY ANGIOGRAPHY;  Surgeon: GAdrian Prows MD;  [facility];  Service: Cardiovascular;  Laterality: N/A;   TESTICLE SURGERY     Family History  Problem Relation Age of Onset   Heart disease Mother    Diabetes Mother     Social History   Tobacco Use   Smoking status: Every Day    Packs/day: 0.25    Years: 40.00    Total pack years: 10.00    Types: Cigarettes   Smokeless tobacco: Never   Tobacco comments:    Given phone number to [place] quit line encouraged smoking cessation  Substance Use Topics   Alcohol use: No   Marital Status: Single   ROS  Review of Systems  Cardiovascular:  Positive for leg swelling (Chronic right leg > Left). Negative for chest pain and dyspnea on exertion.    Objective  Blood  pressure 114/73, pulse 73, height _0  (1.727 m), weight 178 lb 3.2 oz (80.8 kg), SpO2 98 %.     07/13/2022   11:17 AM 06/02/2022    4:04 AM 06/02/2022    4:03 AM  Vitals with BMI  Height _1  _2    Weight 178 lbs 3 oz 172 lbs   BMI 274.2259.56  Systolic 1387 1564 Diastolic 73  67  Pulse 73  72      Physical Exam Vitals reviewed.  Cardiovascular:     Rate and Rhythm: Normal rate and regular rhythm.     Pulses: Intact distal pulses.          Radial pulses are 2+ on the right side and 2+ on the left side.     Heart sounds: S1 normal and S2 normal. No murmur heard.    No gallop.  Pulmonary:     Effort: Pulmonary effort is normal. No respiratory distress.     Breath sounds: No wheezing, rhonchi or rales.  Abdominal:     General: Bowel sounds are normal.     Palpations: Abdomen is soft.  Musculoskeletal:     Right lower leg: Edema (2+ pitting below knee) present.     Left lower leg: Edema (1-2+) present.    Laboratory examination:   Recent Labs    08/06/21 0944 06/02/22 0417  NA  140 137  K 4.6 4.3  CL 105 109  CO2 22 23  GLUCOSE 92 115*  BUN 15 25*  CREATININE 1.54* 1.41*  CALCIUM 9.0 8.5*  GFRNONAA  --  60*      Latest Ref Rng & Units 06/02/2022    4:17 AM 08/06/2021    9:44 AM 07/03/2021    8:14 PM  CMP  Glucose 70 - 99 mg/dL 115  92  85   BUN 6 - 20 mg/dL _0 Creatinine 0.61 - 1.24 mg/dL 1.41  1.54  1.40   Sodium 135 - 145 mmol/L 137  140  135   Potassium 3.5 - 5.1 mmol/L 4.3  4.6  4.0   Chloride 98 - 111 mmol/L 109  105  107   CO2 22 - 32 mmol/L _1 Calcium 8.9 - 10.3 mg/dL 8.5  9.0  8.3       Latest Ref Rng & Units 06/02/2022    4:17 AM 08/06/2021    9:44 AM 07/03/2021    8:14 PM  CBC  WBC 4.0 - 10.5 K/uL 11.5  9.2  11.5   Hemoglobin 13.0 - 17.0 g/dL 14.3  14.6  14.2   Hematocrit 39.0 - 52.0 % 44.0  43.5  42.4   Platelets 150 - 400 K/uL 215  241  234     Lipid Panel Recent Labs    01/23/22 1105  CHOL 146  TRIG 99   LDLCALC 87  HDL 41  HEMOGLOBIN A1C Lab Results  Component Value Date   HGBA1C 6.3 (H) 07/01/2021   MPG 134.11 07/01/2021   Lab Results  Component Value Date   TSH 14.888 (H) 07/02/2021  TSH 03/23/2022: Normal at 1.04.  External labs:  Labs 09/01/2021:  BUN 18, creatinine 1.40, EGFR 61 mL, potassium 4.2, ALT minimally elevated at 148, otherwise ALT and AST are normal.  Hb 14.6/HCT 44.0, platelets 217, normal indicis.  Allergies  No Known Allergies  Final Medications at End of Visit    Current Outpatient Medications:    atorvastatin (LIPITOR) 80 MG tablet, Take 1 tablet (80 mg total) by mouth daily., Disp: 90 tablet, Rfl: 1   BAYER LOW DOSE 81 MG chewable tablet, Chew 81 mg by mouth daily., Disp: , Rfl:    ezetimibe (ZETIA) 10 MG tablet, Take 1 tablet (10 mg total) by mouth every evening., Disp: 90 tablet, Rfl: 3   famotidine (PEPCID) 20 MG tablet, Take 1 mg by mouth daily., Disp: , Rfl:    levothyroxine (SYNTHROID) 125 MCG tablet, Take 125 mcg by mouth daily before breakfast., Disp: , Rfl:    losartan (COZAAR) 25 MG tablet, Take 0.5 tablets (12.5 mg total) by mouth daily after supper., Disp: 90 tablet, Rfl: 3   metoprolol succinate (TOPROL-XL) 25 MG 24 hr tablet, TAKE 1 TABLET (25 MG TOTAL) BY MOUTH DAILY., Disp: 90 tablet, Rfl: 1   nitroGLYCERIN (NITROSTAT) 0.4 MG SL tablet, Place 1 tablet (0.4 mg total) under the tongue every 5 (five) minutes as needed for chest pain., Disp: 30 tablet, Rfl: 12   TABRECTA 200 MG tablet, Take 400 mg by mouth in the morning and at bedtime., Disp: , Rfl:    buPROPion (WELLBUTRIN SR) 150 MG 12 hr tablet, TAKE 1 TABLET (150 MG TOTAL) BY MOUTH 2 TIMES DAILY AT 12 NOON AND 4 PM. 1 TABLET MORNING AND 4 PM (Patient not taking: Reported on 07/13/2022), Disp: 180 tablet, Rfl:  1   [employer] NICOTINE TRANSDERMAL SYS 14 MG/24HR patch, PLACE 1 PATCH (14 MG TOTAL) ONTO THE SKIN DAILY FOR 28 DAYS. (Patient not taking: Reported on 07/13/2022), Disp: 28 patch, Rfl: 0    nicotine (NICODERM CQ) 7 mg/24hr patch, Place 1 patch (7 mg total) onto the skin daily. (Patient not taking: Reported on 07/13/2022), Disp: 28 patch, Rfl: 0   Radiology:   DG Chest Portable 07/01/2021 The heart size and mediastinal contours are within normal limits. Both lungs are clear. The visualized skeletal structures are unremarkable. Impression: No active disease  CT scan of the chest and abdomen 10/20/2021: Heart is normal in size, mild coronary artery calcification.  The aorta and pulmonary trunk are normal in caliber. Emphysematous changes are present in the lungs and apical pleural blebs are noted bilaterally. Aortic atherosclerosis.  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 1.3 x 0.8 cm spiculated nodule right middle lobe not changed from previous exam.  Cardiac Studies:   Lower Extremity Venous Duplex  10/31/2020: - There is no evidence of deep vein thrombosis in the lower extremity.  However, portions of this examination were limited- see technologist  comments above.  LEFT:  - There is no evidence of deep vein thrombosis in the lower extremity.  However, portions of this examination were limited- see technologist comments above.   Left Heart Catheterization 07/01/21:  LV: 141/21, EDP 36 mmHg.  Ao 150/95, mean 118 mmHg.  There was no pressure gradient across the aortic valve. LM: Large vessel. LAD: Thrombotic/ulcerated proximal LAD 80 to 90% stenosis with TIMI II flow.  Apical LAD diffusely diseased.  Direct stenting ONYX FRONTIER 3.5X30 mm DES. Maximum pressure: 16 atm. Inflation time: 45 sec. Stent strut is well apposed.  TIMI II to TIMI-3 flow at the end of the procedure, distal embolization of the apical LAD of no clinical consequence with occlusion.  It is also diffusely diseased at the apex. CX: Very large-caliber vessel.  Mid segment has a 40% stenosis. 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  There  are contralateral collaterals noted and ipsilateral collaterals noted to the distal right coronary artery.  The artery is flush occluded after the RV branch, I did try to open up the vessel however unable to obtain access to the true lumen.  However with the attempted angioplasty, contralateral flow improved all the way back to the proximal segment of the RCA from the LAD. Small to moderate sized RCA and severely diffusely diseased.   07/01/2021: ONYX FRONTIER 3.5X30 mm DES to proximal LAD  Echocardiogram 07/01/2021: 1. Left ventricular ejection fraction, by estimation, is 45 to 50%. The left ventricle has mildly decreased function. The left ventricle  demonstrates regional wall motion abnormalities, There is akinesis of the left  ventricular, apical inferoseptal wall and anteroseptal wall. Left ventricular diastolic parameters were normal.   2. Right ventricular systolic function is normal. The right ventricular size is normal.  3. The mitral valve is normal in structure. No evidence of mitral valve regurgitation. No evidence of mitral stenosis.  4. The aortic valve is normal in structure. Aortic valve regurgitation is not visualized. No aortic stenosis is present.  EKG:   EKG 07/13/2022: Normal sinus rhythm with rate of 78 beats per 100, normal axis.  Incomplete right bundle branch block.  Otherwise normal EKG.  Compared to 01/23/2022, no change.   Assessment     ICD-10-CM   1. Coronary artery disease of  native artery of native heart with stable angina pectoris (HCC)  I25.118 EKG 12-Lead    Lipid Panel With LDL/HDL Ratio    Lipoprotein A (LPA)    CMP14+EGFR    CBC    2. Pure hypercholesterolemia  E78.00 Lipoprotein A (LPA)    3. Tobacco use disorder  F17.200     4. Hypothyroidism due to medication  E03.2 TSH+T4F+T3Free      Medications Discontinued During This Encounter  Medication Reason   BRILINTA 90 MG TABS tablet Completed Course    No orders of the defined types were placed  in this encounter.  Recommendations:   Traeger Kamarion [name] is a 51 y.o. Caucasian male patient with history of hyperlipidemia, hyperglycemia, active smoker, history of cardioembolic stroke in June 3729 and anterolateral STEMI in 2022 SP direct stenting to the LAD.  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  1. Coronary artery disease of native artery of native heart with stable angina pectoris [facility]) Patient has not had any recurrence of angina pectoris.  He has a CTO right but remains asymptomatic.  Continue present medical therapy.  As it has been 1 year since myocardial infarction, he can discontinue Brilinta, continue aspirin 81 mg daily.  2. Pure hypercholesterolemia Lipids mildly elevated, LDL goal <70 closer to 55 in view of his age, will obtain lipid profile testing today.  3. Tobacco use disorder He is smoking about "a few" cigarettes a day, complete abstinence again discussed.  4. Hypothyroidism due to medication I will obtain TSH along with thyroid function panel.  Otherwise stable from cardiac standpoint, no change in his EKG, blood pressure is also normal.  I will see him back in 6 months or sooner if problems send if he remains stable on annual basis.     Adrian Prows, MD, [facility] 07/13/2022, 11:39 AM Office: [phone] Fax: [phone] Pager: [phone]

## 2022-07-15 LAB — CMP14+EGFR
ALT: 20 IU/L (ref 0–44)
AST: 19 IU/L (ref 0–40)
Albumin/Globulin Ratio: 1.6 (ref 1.2–2.2)
Albumin: 3.4 g/dL — ABNORMAL LOW (ref 3.8–4.9)
Alkaline Phosphatase: 168 IU/L — ABNORMAL HIGH (ref 44–121)
BUN/Creatinine Ratio: 14 (ref 9–20)
BUN: 19 mg/dL (ref 6–24)
Bilirubin Total: 0.3 mg/dL (ref 0.0–1.2)
CO2: 21 mmol/L (ref 20–29)
Calcium: 8.7 mg/dL (ref 8.7–10.2)
Chloride: 110 mmol/L — ABNORMAL HIGH (ref 96–106)
Creatinine, Ser: 1.32 mg/dL — ABNORMAL HIGH (ref 0.76–1.27)
Globulin, Total: 2.1 g/dL (ref 1.5–4.5)
Glucose: 87 mg/dL (ref 70–99)
Potassium: 5 mmol/L (ref 3.5–5.2)
Sodium: 143 mmol/L (ref 134–144)
Total Protein: 5.5 g/dL — ABNORMAL LOW (ref 6.0–8.5)
eGFR: 65 mL/min/{1.73_m2} (ref >59)

## 2022-07-15 LAB — TSH+T4F+T3FREE
Free T4: 1.71 ng/dL (ref 0.82–1.77)
T3, Free: 2.5 pg/mL (ref 2.0–4.4)
TSH: 17.4 u[IU]/mL — ABNORMAL HIGH (ref 0.450–4.500)

## 2022-07-15 LAB — LIPID PANEL WITH LDL/HDL RATIO
Cholesterol, Total: 81 mg/dL — ABNORMAL LOW (ref 100–199)
HDL: 34 mg/dL — ABNORMAL LOW (ref >39)
LDL Chol Calc (NIH): 35 mg/dL (ref 0–99)
LDL/HDL Ratio: 1 ratio (ref 0.0–3.6)
Triglycerides: 41 mg/dL (ref 0–149)
VLDL Cholesterol Cal: 12 mg/dL (ref 5–40)

## 2022-07-15 LAB — CBC
Hematocrit: 46.1 % (ref 37.5–51.0)
Hemoglobin: 15.4 g/dL (ref 13.0–17.7)
MCH: 30.4 pg (ref 26.6–33.0)
MCHC: 33.4 g/dL (ref 31.5–35.7)
MCV: 91 fL (ref 79–97)
Platelets: 234 10*3/uL (ref 150–450)
RBC: 5.06 x10E6/uL (ref 4.14–5.80)
RDW: 13.2 % (ref 11.6–15.4)
WBC: 11.7 10*3/uL — ABNORMAL HIGH (ref 3.4–10.8)

## 2022-07-15 LAB — LIPOPROTEIN A (LPA): Lipoprotein (a): 151.3 nmol/L — ABNORMAL HIGH (ref <75.0)

## 2022-07-24 ENCOUNTER — Ambulatory Visit: Payer: Medicare Other | Admitting: Cardiology

## 2022-08-10 ENCOUNTER — Ambulatory Visit (HOSPITAL_COMMUNITY)
Admission: RE | Admit: 2022-08-10 | Discharge: 2022-08-10 | Disposition: A | Discharge status: Home / Self Care | Payer: Medicare Other | Source: Ambulatory Visit | Attending: Internal Medicine | Admitting: Internal Medicine

## 2022-08-10 DIAGNOSIS — C3490 Malignant neoplasm of unspecified part of unspecified bronchus or lung: Secondary | ICD-10-CM | POA: Diagnosis not present

## 2022-08-10 MED ORDER — IOHEXOL 300 MG/ML  SOLN
100.0000 mL | Freq: Once | INTRAMUSCULAR | Status: AC | PRN
  Administered 2022-08-10: 100 mL via INTRAVENOUS

## 2022-08-10 MED ORDER — SODIUM CHLORIDE (PF) 0.9 % IJ SOLN
INTRAMUSCULAR | Status: AC
  Filled 2022-08-10: qty 50

## 2022-08-11 MED ORDER — BUPIVACAINE HCL (PF) 0.25 % IJ SOLN
INTRAMUSCULAR | Status: AC
  Filled 2022-08-11: qty 30

## 2022-08-24 ENCOUNTER — Encounter: Payer: Self-pay | Admitting: Cardiology

## 2022-08-24 NOTE — Telephone Encounter (Signed)
From patient.

## 2022-09-21 ENCOUNTER — Encounter: Payer: Self-pay | Admitting: Cardiology

## 2022-09-21 ENCOUNTER — Ambulatory Visit: Payer: Medicare Other | Admitting: Cardiology

## 2022-09-21 VITALS — BP 114/64 | HR 94 | Resp 16 | Ht 68.0 in | Wt 180.2 lb

## 2022-09-21 DIAGNOSIS — R42 Dizziness and giddiness: Secondary | ICD-10-CM

## 2022-09-21 DIAGNOSIS — I25118 Atherosclerotic heart disease of native coronary artery with other forms of angina pectoris: Secondary | ICD-10-CM

## 2022-09-21 NOTE — Progress Notes (Signed)
Primary Physician/Referring:  Jacob [name], No Pcp Per  Jacob [name] ID: Jamarkis Virginio Mcauley, male    DOB: 15-Jan-1971, 52 y.o.   MRN: [id number]  Chief Complaint  Jacob [name] with  . Low BP  . Fatigue  . Dizziness  . Follow-up   HPI:    Jacob [name]  is a 52 y.o. Caucasian male Jacob [name] with history of hyperlipidemia, hyperglycemia, active smoker, history of cardioembolic stroke in June [id number] and anterolateral STEMI in 2022 SP direct stenting to the LAD.  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  Jacob [name] made an appointment to see me due to dizziness and feeling fatigued.  He has not had any syncope.  Continues to have occasional episodes of angina but has not used any sublingual nitroglycerin.  States that he has significantly reduced smoking cigarettes to <5/day.  He thinks he may be coming close to quitting.  Past Medical History:  Diagnosis Date  . CAD (coronary artery disease), native coronary artery 07/01/2021  . Lung cancer ([place])   . Stroke ([place])   . Testicle cancer [facility])    Past Surgical History:  Procedure Laterality Date  . CARDIAC CATHETERIZATION    . LEFT HEART CATH AND CORONARY ANGIOGRAPHY N/A 07/01/2021   Procedure: LEFT HEART CATH AND CORONARY ANGIOGRAPHY;  Surgeon: Adrian Prows, MD;  Location: [place] [facility];  Service: Cardiovascular;  Laterality: N/A;  . TESTICLE SURGERY     Family History  Problem Relation Age of Onset  . Heart disease Mother   . Diabetes Mother     Social History   Tobacco Use  . Smoking status: Every Day    Packs/day: 0.25    Years: 40.00    Total pack years: 10.00    Types: Cigarettes  . Smokeless tobacco: Never  . Tobacco comments:    Given phone number to [place] quit line encouraged smoking cessation  Substance Use Topics  . Alcohol use: No   Marital Status:  Single   ROS  Review of Systems  Cardiovascular:  Positive for leg swelling (Chronic right leg > Left). Negative for chest pain and dyspnea on exertion.  Neurological:  Positive for dizziness.    Objective  Blood pressure 114/64, pulse 94, resp. rate 16, height '5\' 8"'$  (1.727 m), weight 180 lb 3.2 oz (81.7 kg), SpO2 96 %.     09/21/2022    2:11 PM 07/13/2022   11:17 AM 06/02/2022    4:04 AM  Vitals with BMI  Height '5\' 8"'$  '5\' 8"'$  '5\' 8"'$   Weight 180 lbs 3 oz 178 lbs 3 oz 172 lbs  BMI 27.41 0000000 0000000  Systolic 99991111 99991111   Diastolic 64 73   Pulse 94 73     Orthostatic VS for the past 72 hrs (Last 3 readings):  Orthostatic BP Jacob [name] Position BP Location Cuff Size Orthostatic Pulse  09/21/22 1417 98/64 Standing Left Arm Normal 87  09/21/22 1416 103/68 Sitting Left Arm Normal 84  09/21/22 1415 104/65 Supine Left Arm Normal 88      Physical Exam Vitals reviewed.  Neck:     Vascular: No carotid bruit or JVD.  Cardiovascular:     Rate and Rhythm: Normal rate and regular rhythm.     Pulses: Intact distal pulses.          Radial pulses are 2+ on the right side and 2+ on the  left side.     Heart sounds: S1 normal and S2 normal. No murmur heard.    No gallop.  Pulmonary:     Effort: Pulmonary effort is normal.     Breath sounds: Normal breath sounds.  Abdominal:     General: Bowel sounds are normal.     Palpations: Abdomen is soft.  Musculoskeletal:     Right lower leg: Edema (1+ pitting below knee) present.     Left lower leg: Edema (Trace pitting) present.   Laboratory examination:   Recent Labs    06/02/22 0417 07/13/22 1155  NA 137 143  K 4.3 5.0  CL 109 110*  CO2 23 21  GLUCOSE 115* 87  BUN 25* 19  CREATININE 1.41* 1.32*  CALCIUM 8.5* 8.7  GFRNONAA 60*  --       Latest Ref Rng & Units 07/13/2022   11:55 AM 06/02/2022    4:17 AM 08/06/2021    9:44 AM  CMP  Glucose 70 - 99 mg/dL 87  115  92   BUN 6 - 24 mg/dL '19  25  15   '$ Creatinine 0.76 - 1.27 mg/dL 1.32  1.41   1.54   Sodium 134 - 144 mmol/L 143  137  140   Potassium 3.5 - 5.2 mmol/L 5.0  4.3  4.6   Chloride 96 - 106 mmol/L 110  109  105   CO2 20 - 29 mmol/L '21  23  22   '$ Calcium 8.7 - 10.2 mg/dL 8.7  8.5  9.0   Total Protein 6.0 - 8.5 g/dL 5.5     Total Bilirubin 0.0 - 1.2 mg/dL 0.3     Alkaline Phos 44 - 121 IU/L 168     AST 0 - 40 IU/L 19     ALT 0 - 44 IU/L 20         Latest Ref Rng & Units 07/13/2022   11:55 AM 06/02/2022    4:17 AM 08/06/2021    9:44 AM  CBC  WBC 3.4 - 10.8 x10E3/uL 11.7  11.5  9.2   Hemoglobin 13.0 - 17.7 g/dL 15.4  14.3  14.6   Hematocrit 37.5 - 51.0 % 46.1  44.0  43.5   Platelets 150 - 450 x10E3/uL 234  215  241     Lipid Panel Recent Labs    01/23/22 1105 07/13/22 1155  CHOL 146 81*  TRIG 99 41  LDLCALC 87 35  HDL 41 34*  HEMOGLOBIN A1C Lab Results  Component Value Date   HGBA1C 6.3 (H) 07/01/2021   MPG 134.11 07/01/2021   Lab Results  Component Value Date   TSH 17.400 (H) 07/13/2022  TSH 03/23/2022: Normal at 1.04.  External labs:  Labs 09/01/2021:  BUN 18, creatinine 1.40, EGFR 61 mL, potassium 4.2, ALT minimally elevated at 148, otherwise ALT and AST are normal.  Hb 14.6/HCT 44.0, platelets 217, normal indicis.  Allergies  No Known Allergies  Final Medications at End of Visit    Current Outpatient Medications:  .  atorvastatin (LIPITOR) 80 MG tablet, Take 1 tablet (80 mg total) by mouth daily., Disp: 90 tablet, Rfl: 1 .  BAYER LOW DOSE 81 MG chewable tablet, Chew 81 mg by mouth daily., Disp: , Rfl:  .  ezetimibe (ZETIA) 10 MG tablet, Take 1 tablet (10 mg total) by mouth every evening., Disp: 90 tablet, Rfl: 3 .  levothyroxine (SYNTHROID) 125 MCG tablet, Take 125 mcg by mouth daily before breakfast., Disp: , Rfl:  .  nitroGLYCERIN (NITROSTAT) 0.4 MG SL tablet, Place 1 tablet (0.4 mg total) under the tongue every 5 (five) minutes as needed for chest pain., Disp: 30 tablet, Rfl: 12 .  TABRECTA 200 MG tablet, Take 400 mg by mouth in the  morning and at bedtime., Disp: , Rfl:    Radiology:   DG Chest Portable 07/01/2021 The heart size and mediastinal contours are within normal limits. Both lungs are clear. The visualized skeletal structures are unremarkable. Impression: No active disease  CT scan of the chest and abdomen 08/10/2022: 1. Slight interval decrease in size of the 10 mm right middle lobe pulmonary nodule, with similar interposed scarring in the right middle lobe. 2. Stable prominent/mildly enlarged bilateral iliac side chain lymph nodes and prominent mediastinal lymph nodes. 3. Emphysema  Cardiac Studies:   Lower Extremity Venous Duplex  10/31/2020: - There is no evidence of deep vein thrombosis in the lower extremity.  However, portions of this examination were limited- see technologist  comments above.  LEFT:  - There is no evidence of deep vein thrombosis in the lower extremity.  However, portions of this examination were limited- see technologist comments above.   Left Heart Catheterization 07/01/21:  LV: 141/21, EDP 36 mmHg.  Ao 150/95, mean 118 mmHg.  There was no pressure gradient across the aortic valve. LM: Large vessel. LAD: Thrombotic/ulcerated proximal LAD 80 to 90% stenosis with TIMI II flow.  Apical LAD diffusely diseased.  Direct stenting ONYX FRONTIER 3.5X30 mm DES. Maximum pressure: 16 atm. Inflation time: 45 sec. Stent strut is well apposed.  TIMI II to TIMI-3 flow at the end of the procedure, distal embolization of the apical LAD of no clinical consequence with occlusion.  It is also diffusely diseased at the apex. CX: Very large-caliber vessel.  Mid segment has a 40% stenosis. 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  There are contralateral collaterals noted and ipsilateral collaterals noted to the distal right coronary artery.  The artery is flush occluded after the RV branch, I did try to open up the vessel however unable to obtain access to the true  lumen.  However with the attempted angioplasty, contralateral flow improved all the way back to the proximal segment of the RCA from the LAD. Small to moderate sized RCA and severely diffusely diseased.   07/01/2021: ONYX FRONTIER 3.5X30 mm DES to proximal LAD  Echocardiogram 07/01/2021: 1. Left ventricular ejection fraction, by estimation, is 45 to 50%. The left ventricle has mildly decreased function. The left ventricle  demonstrates regional wall motion abnormalities, There is akinesis of the left  ventricular, apical inferoseptal wall and anteroseptal wall. Left ventricular diastolic parameters were normal.   2. Right ventricular systolic function is normal. The right ventricular size is normal.  3. The mitral valve is normal in structure. No evidence of mitral valve regurgitation. No evidence of mitral stenosis.  4. The aortic valve is normal in structure. Aortic valve regurgitation is not visualized. No aortic stenosis is present.  EKG:   EKG 09/21/2022: Normal sinus rhythm at the rate of 75 bpm, incomplete right bundle branch block.  Normal EKG.  No change from 07/13/2022.  Assessment     ICD-10-CM   1. Dizziness and giddiness  R42     2. Atypical chest pain  R07.89     3. Coronary artery disease of native artery of native heart with stable angina pectoris (HCC)  I25.118 EKG 12-Lead    PCV ECHOCARDIOGRAM COMPLETE  Medications Discontinued During This Encounter  Medication Reason  . CVS NICOTINE TRANSDERMAL SYS 14 MG/24HR patch   . nicotine (NICODERM CQ) 7 mg/24hr patch   . losartan (COZAAR) 25 MG tablet   . famotidine (PEPCID) 20 MG tablet Completed Course  . buPROPion (WELLBUTRIN SR) 150 MG 12 hr tablet Completed Course  . metoprolol succinate (TOPROL-XL) 25 MG 24 hr tablet Discontinued by provider    No orders of the defined types were placed in this encounter.  Recommendations:   Jacob [name] is a 52 y.o. Caucasian male Jacob [name] with history of  hyperlipidemia, hyperglycemia, active smoker, history of cardioembolic stroke in June [id number] and anterolateral STEMI in 2022 SP direct stenting to the LAD.  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  1. Dizziness and giddiness Jacob [name]'s dizziness and giddiness is related to low blood pressure, I will discontinue metoprolol succinate 25 mg daily.  He has not been able to tolerate any other medications for blood pressure, he had also discontinued losartan in view of low blood pressure.  Will continue with aspirin alone along with statin therapy..  2. Coronary artery disease of native artery of native heart with stable angina pectoris ([place]) With regard to coronary disease he has had stable angina pectoris class I-II.  Will continue to monitor this.  He has not used sublingual nitroglycerin over the past 3 months.  Will repeat echocardiogram to follow-up on any wall motion abnormality.  Lipids under excellent control.  With regard to metastatic lung cancer, I reviewed his CT scan, he continues to show significant response to chemotherapy and there is no new lesions noted.  I congratulated him.  He has reduced his smoking to 5 cigarettes a day and I again reinforced complete abstinence.    Adrian Prows, MD, [facility] 09/21/2022, 2:33 PM Office: [phone] Fax: [phone] Pager: [phone]

## 2022-10-01 ENCOUNTER — Emergency Department: Admit: 2022-10-02 | Payer: MEDICAID | Primary: Family Medicine

## 2022-10-01 DIAGNOSIS — I2694 Multiple subsegmental pulmonary emboli without acute cor pulmonale: Secondary | ICD-10-CM

## 2022-10-01 DIAGNOSIS — R0902 Hypoxemia: Secondary | ICD-10-CM

## 2022-10-01 NOTE — ED Notes (Signed)
O2 via NC at 2L/min applied

## 2022-10-01 NOTE — H&P (Signed)
 [facility] History and Physical    Patient Information:  Patient: Danny Ruiz  MRN: [id number]   Acct: [id number]  Date of Birth: [birth year]  Admit Date: 10/01/2022      Primary Care Physician: Danny Daved CROME, MD  Advance Directive: Full Code  Health Care Proxy: Mrs. Danny Ruiz, his wife, [phone]        SUBJECTIVE:    Chief Complaint   Patient presents with    Shortness of Breath     Onset a few days, worse today; non-productive cough for a few days    Chills     EP Sign Out:  on heparin  GGT, not on home [facility], has Hx PEs, no heart strain on CTA and trop negative - vascular Sx to see in AM as per labored breathinbg with just moving     HPI:  [name]  is a 52 year old caucasian american gentleman. He came to the hospital as he felt ill yesterday, he states that he had been ill for a few weeks. He had dyspnea with coughing fit, and hs neck congested., he has had some dripping that has caused him coughing fits and he states that he had no allergy at all.     He get's dyspneic with just speaking in a normal voice and at a normal rate.    Review of Systems:   Review of Systems   Constitutional:  Negative for chills, diaphoresis, fatigue and fever.   Respiratory:  Positive for shortness of breath.    Cardiovascular:  Negative for chest pain.   Gastrointestinal:  Negative for nausea and vomiting.   Neurological:  Negative for syncope, weakness and light-headedness (with coughing fits).   Psychiatric/Behavioral:  Negative for confusion.      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     Social History       Tobacco History       Smoking Status  Every Day Smoking  Tobacco Type  Cigarettes      Smokeless Tobacco Use  Never      Tobacco Comments  Started at age 61, has had a PPD on avrg since he began, smoked 40 pack-years              Alcohol History       Alcohol Use Status  Not Currently Comment  drank in younger years on the weekends, never heavy              Drug Use       Drug Use Status  Not Currently Comment  tried MJ a few times in the past              Sexual Activity       Sexually Active  Defer Comment  Has 3 kids             CODE STATUS: Full Code  HEALTH CARE PROXY: Mrs. Danny Ruiz, his wife, [phone]  AMBULATES: independently  DOMICILED: has steps to enter his home, no stairs inside home, lives with  his wife the kids are all grown, has a cat and dog in the home     Family History   Problem Relation Age of Onset    No Known Problems Mother     No Known Problems Father     No Known Problems Brother     No Known Problems Half-Sister     No Known Problems Half-Sister     No Known Problems Daughter     No Known Problems Daughter     No Known Problems Son      Allergies:   No Known Allergies    Home Medications:  Prior to Admission medications    Not on File         OBJECTIVE:    Vitals:    10/02/22 0633   BP:    Pulse:    Resp:    Temp:    SpO2: 97%   breathing on 1.5 LPM via NC    BP 132/84   Pulse 87   Temp 98.4 F (36.9 C) (Oral)   Resp 22   Ht 1.854 m (6' 1)   Wt 88.2 kg (194 lb 7 oz)   SpO2 97%   BMI 25.65 kg/m     No intake or output data in the 24 hours ending 10/02/22 0730    Physical Exam  Constitutional:       General: He is not in acute distress.     Appearance: Normal appearance. He is normal weight. He is not ill-appearing or toxic-appearing.   HENT:      Head: Normocephalic and atraumatic.      Nose: No congestion or rhinorrhea.   Eyes:      General:         Right eye: No discharge.         Left eye: No discharge.   Neck:      Comments: Trachea appears supple, neck appears midline  Cardiovascular:      Rate and Rhythm: Normal rate and  regular rhythm.      Heart sounds: No murmur heard.     No friction rub. No gallop.   Pulmonary:      Effort: No respiratory distress.      Breath sounds: No wheezing, rhonchi or rales.   Chest:      Chest wall: No tenderness.   Abdominal:      General: Bowel sounds are normal.      Tenderness: There is no abdominal tenderness. There is no guarding or rebound.   Skin:     General: Skin is warm.      Comments: Nonpericardali   Neurological:      Mental Status: He is alert.   Psychiatric:         Mood and Affect: Mood normal.         Behavior: Behavior normal.       LABORATORY DATA:    CBC:   Recent Labs     10/01/22  2023   WBC 13.5*   HGB 14.6   HCT 43.6   PLT 316     BMP:   Recent Labs     10/01/22  2023 10/02/22  0412   NA 140 138   K 3.9 4.2   CL 105 104   CO2 22 21*   BUN 10 8   CREATININE 1.1 1.1   CALCIUM  8.8 8.3*     Hepatic Profile:   Recent Labs     10/01/22  2023   AST 28   ALT 38   BILITOT 0.5   ALKPHOS 98     Coag Panel:   Recent Labs     10/01/22  2023   INR 0.97   PROTIME 12.6   APTT 30.6     Cardiac Enzymes: No results for input(s): CKTOTAL, TROPONINI in the last 72 hours.  Pro-BNP: No results for input(s): PROBNP in the last 72 hours.  A1C: No results for input(s): LABA1C in the last 72 hours.  TSH: Invalid input(s): LABTSH  Lipid Panel: Invalid input(s): LABLIP  ABG: No results for input(s): PHART, PCO2ART, PO2ART, HCO3ART, BEART, HGBAE, O2SATART, CARBOXHGBART in the last 72 hours.    Urinalysis:   Lab Results   Component Value Date/Time    NITRU NEGATIVE 10/05/2013 06:09 PM    GLUCOSEU NEGATIVE 10/05/2013 06:09 PM       EKG:   The tracing was not seen by me, as per ED PA Danny Ruiz's's documentation it had shown:  EKG interpreted by attending, Sinus rhythm at a rate of 102, No stemi or acute ischemia, PR 132, QTc 449      IMAGING:  CTA PULMONARY W CONTRAST  Result Date: 10/01/2022  Multiple bilateral pulmonary emboli as described above. No evidence of right heart strain.  1  cm subpleural nodule in the left lower lobe could be post infectious/inflammatory or neoplastic.  Recommend a follow-up chest CT in 3 months to reevaluate.  Bronchiolitis.  Communication: M. Bean conveyed the urgent result to J. Carroll on 10/01/2022 at 2233.  All CT scans are performed using dose optimization techniques as appropriate to the performed exam and include at least one of the following: Automated exposure control, adjustment of the mA and/or kV according to size, and the use of iterative reconstruction technique.  ______________________________________ Electronically signed by: DONNICE FRESHWATER M.D. Date:     10/01/2022 Time:    22:27     XR CHEST PORTABLE  Result Date: 10/01/2022   1. Bibasilar atelectasis.    .   ______________________________________ Electronically signed by: LONNI BERKSHIRE D.O. Date:     10/01/2022 Time:    21:30         ASESSMENTS & PLANS:    Patient Active Problem List   Diagnosis    Pulmonary embolism (HCC)    GERD (gastroesophageal reflux disease)    Tobacco abuse    History of deep vein thrombosis     Pulmonary Emboli: no heart strain as per CT or Tn but high clot burden  History of DVT but NOT on AC:  Admit to medical ward  Heparin  GGT  NPO except sips with meds  Vasc Sx to see in AM  Heparin  GGT for DVT/PE (High Inrensity) Protocol  BMP with Mag daily  CBC with Diff daily  Bed Rest  Defer on Doppler Legs Venous to Vascular Sx    GERD:  Famotidine  20 mg PO Qday, BID if having symptoms    Supportive and Prophylactic Txx:  DVT PPx: Heparin  GGT  GI (PUD) PPx: not indicated  PT: not indicated  Diet NPO Exceptions are: Sips of Water with Meds  polyethylene glycol, melatonin, calcium  carbonate, sodium chloride  flush, sodium chloride , potassium chloride  **OR** potassium alternative oral replacement **OR** potassium chloride , magnesium  sulfate, ondansetron  **OR** ondansetron , acetaminophen , heparin  (porcine), heparin  (porcine)      Pt seen/examined and admitted to inpatient  status.  Inpatient status is used for patients with an expected LOS extending past two midnights due to medical  therapy and or critical care needs, otherwise patients are placed to OBServation status.    Signed:  Electronically signed by Lamar Pump, MD on 10/02/22 at 7:47 AM CST.

## 2022-10-01 NOTE — ED Notes (Signed)
 ED TO INPATIENT SBAR HANDOFF    Patient Name: Danny Ruiz   DOB: [birth year]  52 y.o.   Family/Caregiver Present: Yes  Code Status Order: No Order    C-SSRS: Risk of Suicide: No Risk  Sitter No  Restraints:         Situation  Chief Complaint:   Chief Complaint   Patient presents with    Shortness of Breath     Onset a few days, worse today; non-productive cough for a few days    Chills     Patient Diagnosis: No admission diagnoses are documented for this encounter.     Brief Description of Patient's Condition:   Chief Complaint   Patient presents with    Shortness of Breath     Onset a few days, worse today; non-productive cough for a few days    Chills   Multiple small bilateral PE   Mental Status: oriented, alert, coherent, logical, and thought processes intact  Arrived from: home    Imaging:   CTA PULMONARY W CONTRAST   Final Result   Multiple bilateral pulmonary emboli as described above. No evidence of right heart strain.       1 cm subpleural nodule in the left lower lobe could be post infectious/inflammatory or neoplastic.  Recommend a follow-up chest CT in 3 months to reevaluate.       Bronchiolitis.       Communication: M. Bean conveyed the urgent result to J. Carroll on 10/01/2022 at 2233.        All CT scans are performed using dose optimization techniques as appropriate to the performed exam and include    at least one of the following: Automated exposure control, adjustment of the mA and/or kV according to size, and the use of iterative reconstruction technique.        ______________________________________    Electronically signed by: DONNICE FRESHWATER M.D.   Date:     10/01/2022   Time:    22:27       XR CHEST PORTABLE   Final Result       1. Bibasilar atelectasis.               .           ______________________________________    Electronically signed by: LONNI BERKSHIRE D.O.   Date:     10/01/2022   Time:    21:30         COVID-19 Results:   Internal Administration   First Dose      Second Dose            Last COVID Lab SARS-CoV-2, PCR (no units)   Date Value   10/01/2022 Not Detected           Abnormal labs:   Abnormal Labs Reviewed   CBC WITH AUTO DIFFERENTIAL - Abnormal; Notable for the following components:       Result Value    WBC 13.5 (*)     MPV 8.9 (*)     Neutrophils % 70.1 (*)     Lymphocytes % 18.2 (*)     Neutrophils Absolute 9.5 (*)     All other components within normal limits   COMPREHENSIVE METABOLIC PANEL - Abnormal; Notable for the following components:    Glucose 152 (*)     All other components within normal limits   D-DIMER, QUANTITATIVE - Abnormal; Notable for the following components:    D-Dimer, Quant 1.39 (*)  All other components within normal limits     Background  Allergies: No Known Allergies  Current Medications:   Medications Administered         heparin  (porcine) injection 7,620 Units Admin Date  10/01/2022 Action  Given Dose  7,620 Units Rate   Route  IntraVENous Administered By  Brien Massie CROME, RN        heparin  25,000 units in dextrose  5% 250 mL (premix) infusion Admin Date  10/01/2022 Action  New Bag Dose  18 Units/kg/hr Rate  17.2 mL/hr Route  IntraVENous Administered By  Brien Massie CROME, RN        heparin  25,000 units in dextrose  5% 250 mL (premix) infusion Admin Date  10/01/2022 Action  New Bag Dose  18 Units/kg/hr Rate  17.2 mL/hr Route  IntraVENous Administered By  Brien Massie CROME, RN        iopamidol  (ISOVUE -370) 76 % injection 70 mL Admin Date  10/01/2022 Action  Given Dose  70 mL Rate   Route  IntraVENous Administered By  Elvira Inoue            History: No past medical history on file.    Assessment  Vitals: Level of Consciousness: Alert (0)   Vitals:    10/01/22 2200 10/01/22 2215 10/01/22 2230 10/01/22 2300   BP: 117/78 128/87 130/85 117/78   Pulse: 100 98 99 97   Resp: 15 18 18 23    Temp:       TempSrc:       SpO2: 95% 96% 95% 95%   Weight:       Height:         Predictive Model Details   No score data available for Deterioration Index      NPO? Yes  O2  Flow Rate: O2 Device: None (Room air)    Cardiac Rhythm: Sinus Tach    NIH Score: NIH     Active LDA's:   Peripheral IV 10/01/22 Left Antecubital (Active)   Site Assessment Clean, dry & intact 10/01/22 2027   Line Status Blood return noted;Flushed;Brisk blood return;Normal saline locked;Specimen collected 10/01/22 2027   Phlebitis Assessment No symptoms 10/01/22 2027   Infiltration Assessment 0 10/01/22 2027   Dressing Status New dressing applied 10/01/22 2027     Pertinent or High Risk Medications/Drips: yes   If Yes, please provide details: Heparin    Blood Product Administration: no  If Yes, please provide details:   Sepsis Risk Score Sepsis Risk Score: 3.67    Admitted with Sepsis? No    Recommendation  Incomplete orders:   Patient Belongings:   Additional Comments:   If any further questions, please call Sending RN at [phone]      Electronically signed by: Electronically signed by Massie CROME Brien, RN on 10/01/2022 at 11:24 PM

## 2022-10-01 NOTE — ED Provider Notes (Addendum)
 [facility] EMERGENCY DEPT  eMERGENCY dEPARTMENT eNCOUnter      Pt Name: Danny Ruiz  MRN: [id number]  Birthdate April 29, 1971  Date of evaluation: 10/01/2022  Provider: Walaa Carel, PA    CHIEF COMPLAINT       Chief Complaint   Patient presents with    Shortness of Breath     Onset a few days, worse today; non-productive cough for a few days    Chills         HISTORY OF PRESENT ILLNESS   (Location/Symptom, Timing/Onset,Context/Setting, Quality, Duration, Modifying Factors, Severity)  Note limiting factors.   Danny Ruiz is a 52 y.o. male with history of left nephrectomy and blood clots who presents to the emergency department with complaint of shortness of breath.  The patient notes shortness of breath that began a few days ago but worsened today.  He has had a nonproductive cough for the last few days as well.  He denies any fever or chills.  He does note some pressure in his sinuses but denies any significant headache or dizziness.  He denies any chest pain associated with the shortness of breath.  He denies GI complaints.  He denies any swelling of the lower extremities.  He denies any known sick contacts.  He denies being on any blood thinning medication.    NursingNotes were reviewed.    REVIEW OF SYSTEMS    (2-9 systems for level 4, 10 or more for level 5)     Review of Systems   Constitutional:  Negative for chills and fever.   HENT:  Negative for congestion.    Respiratory:  Positive for cough, chest tightness and shortness of breath.    Cardiovascular:  Negative for chest pain.   Gastrointestinal:  Negative for abdominal pain, diarrhea, nausea and vomiting.   Musculoskeletal:  Positive for myalgias.   Neurological:  Negative for dizziness and headaches.   All other systems reviewed and are negative.           PAST MEDICALHISTORY   No past medical history on file.      SURGICAL HISTORY       Past Surgical History:   Procedure Laterality Date    KIDNEY REMOVAL Left          CURRENT MEDICATIONS     Previous  Medications    No medications on file       ALLERGIES     Patient has no known allergies.    FAMILY HISTORY     No family history on file.       SOCIAL HISTORY       Social History     Socioeconomic History    Marital status: Married   Tobacco Use    Smoking status: Every Day     Current packs/day: 1.00     Types: Cigarettes    Smokeless tobacco: Never   Substance and Sexual Activity    Alcohol use: Not Currently    Drug use: Not Currently       SCREENINGS    Glasgow Coma Scale  Eye Opening: Spontaneous  Best Verbal Response: Oriented  Best Motor Response: Obeys commands  Glasgow Coma Scale Score: 15        PHYSICAL EXAM    (up to 7 for level 4, 8 or more for level 5)     ED Triage Vitals   BP Temp Temp Source Pulse Respirations SpO2 Height Weight - Scale   10/01/22 2015 10/01/22 2011  10/01/22 2011 10/01/22 2011 10/01/22 2011 10/01/22 2011 10/01/22 2011 10/01/22 2011   129/79 98.6 F (37 C) Oral (!) 117 24 (!) 89 % 1.854 m (6' 1) 95.3 kg (210 lb)       Physical Exam  Vitals and nursing note reviewed.   Constitutional:       General: He is not in acute distress.     Appearance: He is well-developed. He is ill-appearing. He is not toxic-appearing or diaphoretic.   HENT:      Head: Normocephalic and atraumatic.   Neck:      Vascular: No JVD.   Cardiovascular:      Rate and Rhythm: Tachycardia present.      Pulses: Normal pulses.   Pulmonary:      Effort: Tachypnea present. No respiratory distress.      Breath sounds: Decreased breath sounds present.      Comments: Shallow breaths, episodes of tachypnea, increased work of breathing with mild exertion  Chest:      Chest wall: No tenderness.   Abdominal:      Palpations: Abdomen is soft.      Tenderness: There is no abdominal tenderness.   Musculoskeletal:      Cervical back: Normal range of motion and neck supple.      Right lower leg: No tenderness. No edema.      Left lower leg: No tenderness. No edema.   Lymphadenopathy:      Cervical: No cervical adenopathy.    Skin:     General: Skin is warm and dry.   Neurological:      General: No focal deficit present.      Mental Status: He is alert and oriented to person, place, and time.         DIAGNOSTIC RESULTS     EKG: All EKG's areinterpreted by the Emergency Department Physician who either signs or Co-signs this chart in the absence of a cardiologist.    EKG interpreted by attending, Sinus rhythm at a rate of 102, No stemi or acute ischemia, PR 132, QTc 449     RADIOLOGY:  Non-plain film images such as CT, Ultrasound and MRI are read by the radiologist. Plain radiographic images are visualized and preliminarily interpreted bythe emergency physician with the below findings:    CTA PULMONARY W CONTRAST   Final Result   Multiple bilateral pulmonary emboli as described above. No evidence of right heart strain.       1 cm subpleural nodule in the left lower lobe could be post infectious/inflammatory or neoplastic.  Recommend a follow-up chest CT in 3 months to reevaluate.       Bronchiolitis.       Communication: M. Bean conveyed the urgent result to J. Melessia Kaus on 10/01/2022 at 2233.        All CT scans are performed using dose optimization techniques as appropriate to the performed exam and include    at least one of the following: Automated exposure control, adjustment of the mA and/or kV according to size, and the use of iterative reconstruction technique.        ______________________________________    Electronically signed by: DONNICE FRESHWATER M.D.   Date:     10/01/2022   Time:    22:27       XR CHEST PORTABLE   Final Result       1. Bibasilar atelectasis.               SABRA  ______________________________________    Electronically signed by: LONNI BERKSHIRE D.O.   Date:     10/01/2022   Time:    21:30         LABS:  Labs Reviewed   CBC WITH AUTO DIFFERENTIAL - Abnormal; Notable for the following components:       Result Value    WBC 13.5 (*)     MPV 8.9 (*)     Neutrophils % 70.1 (*)     Lymphocytes % 18.2 (*)      Neutrophils Absolute 9.5 (*)     All other components within normal limits   COMPREHENSIVE METABOLIC PANEL - Abnormal; Notable for the following components:    Glucose 152 (*)     All other components within normal limits   D-DIMER, QUANTITATIVE - Abnormal; Notable for the following components:    D-Dimer, Quant 1.39 (*)     All other components within normal limits   ANTI-XA, UNFRACTIONATED HEPARIN  - Abnormal; Notable for the following components:    Anti-XA Unfrac Heparin  <0.10 (*)     All other components within normal limits   RESPIRATORY PANEL, MOLECULAR, WITH COVID-19   LACTATE, SEPSIS   LACTATE, SEPSIS   TROPONIN   APTT   PROTIME-INR   URINALYSIS WITH REFLEX TO CULTURE   CBC   ANTI-XA, UNFRACTIONATED HEPARIN    ANTI-XA, UNFRACTIONATED HEPARIN        All other labs were within normal range or not returned as of this dictation.    EMERGENCY DEPARTMENT COURSE and DIFFERENTIAL DIAGNOSIS/MDM:   Vitals:    Vitals:    10/01/22 2200 10/01/22 2215 10/01/22 2230 10/01/22 2300   BP: 117/78 128/87 130/85 117/78   Pulse: 100 98 99 97   Resp: 15 18 18 23    Temp:       TempSrc:       SpO2: 95% 96% 95% 95%   Weight:       Height:           MDM     Amount and/or Complexity of Data Reviewed  Clinical lab tests: reviewed  Tests in the radiology section of CPT: reviewed    Patient is a 52 year old male who presents to the ER with complaint of shortness of breath with cough.  He did have decreased oxygen saturation in the ER down to 89% so oxygen was started.  He does have mild leukocytosis on CBC without anemia.  CMP unremarkable.  EKG and troponin negative.  His D-dimer was elevated so CTA obtained which showed multiple pulmonary embolisms in multiple lobes bilaterally.  There does not appear to be any heart strain.  Coags are normal.  No lactic elevation.  His viral panel is negative.  No other acute cardiopulmonary process noted on CT scan.  He was started on heparin .  I discussed case with hospitalist for admission.  Patient  is agreeable to plan.    CONSULTS:  Dr Claudean, Hospitalist    FINAL IMPRESSION      1. Hypoxia    2. Multiple subsegmental pulmonary emboli without acute cor pulmonale (HCC)          DISPOSITION/PLAN   DISPOSITION Admitted 10/01/2022 11:33:09 PM      PATIENT REFERRED TO:  No follow-up provider specified.    DISCHARGE MEDICATIONS:  New Prescriptions    No medications on file          (Please note that portions of this note were completed with a voice recognition program.  Efforts were made to edit thedictations but occasionally words are mis-transcribed.)    Giovoni Bunch, PA (electronically signed)       Tajuanna Burnett, [place]  10/01/22 2346

## 2022-10-02 ENCOUNTER — Inpatient Hospital Stay
Admission: EM | Admit: 2022-10-02 | Discharge: 2022-10-04 | Disposition: A | Discharge status: Home / Self Care | Payer: MEDICAID | Admitting: Internal Medicine

## 2022-10-02 DIAGNOSIS — I2694 Multiple subsegmental pulmonary emboli without acute cor pulmonale: Secondary | ICD-10-CM

## 2022-10-02 LAB — BASIC METABOLIC PANEL W/ REFLEX TO MG FOR LOW K
Anion Gap: 13 mmol/L (ref 7–19)
BUN: 8 mg/dL (ref 6–20)
CO2: 21 mmol/L — ABNORMAL LOW (ref 22–29)
Calcium: 8.3 mg/dL — ABNORMAL LOW (ref 8.6–10.0)
Chloride: 104 mmol/L (ref 98–111)
Creatinine: 1.1 mg/dL (ref 0.5–1.2)
Est, Glom Filt Rate: >60 (ref >60)
Glucose: 120 mg/dL — ABNORMAL HIGH (ref 74–109)
Potassium reflex Magnesium: 4.2 mmol/L (ref 3.5–5.0)
Sodium: 138 mmol/L (ref 136–145)

## 2022-10-02 LAB — CBC WITH AUTO DIFFERENTIAL
Basophils %: 0.5 % (ref 0.0–1.0)
Basophils %: 0.4 % (ref 0.0–1.0)
Basophils Absolute: 0.1 10*3/uL (ref 0.00–0.20)
Basophils Absolute: 0.1 10*3/uL (ref 0.00–0.20)
Eosinophils %: 4.7 % (ref 0.0–5.0)
Eosinophils %: 4.5 % (ref 0.0–5.0)
Eosinophils Absolute: 0.6 10*3/uL (ref 0.00–0.60)
Eosinophils Absolute: 0.6 10*3/uL (ref 0.00–0.60)
Hematocrit: 43.6 % (ref 42.0–52.0)
Hematocrit: 41.9 % — ABNORMAL LOW (ref 42.0–52.0)
Hemoglobin: 14.6 g/dL (ref 14.0–18.0)
Hemoglobin: 13.5 g/dL — ABNORMAL LOW (ref 14.0–18.0)
Immature Granulocytes #: 0.1 10*3/uL
Immature Granulocytes #: 0.1 10*3/uL
Lymphocytes %: 18.2 % — ABNORMAL LOW (ref 20.0–40.0)
Lymphocytes %: 15.4 % — ABNORMAL LOW (ref 20.0–40.0)
Lymphocytes Absolute: 2.5 10*3/uL (ref 1.1–4.5)
Lymphocytes Absolute: 2.1 10*3/uL (ref 1.1–4.5)
MCH: 29.7 pg (ref 27.0–31.0)
MCH: 29.5 pg (ref 27.0–31.0)
MCHC: 33.5 g/dL (ref 33.0–37.0)
MCHC: 32.2 g/dL — ABNORMAL LOW (ref 33.0–37.0)
MCV: 88.8 fL (ref 80.0–94.0)
MCV: 91.5 fL (ref 80.0–94.0)
MPV: 8.9 fL — ABNORMAL LOW (ref 9.4–12.4)
MPV: 9.2 fL — ABNORMAL LOW (ref 9.4–12.4)
Monocytes %: 6.1 % (ref 0.0–10.0)
Monocytes %: 7.5 % (ref 0.0–10.0)
Monocytes Absolute: 0.8 10*3/uL (ref 0.00–0.90)
Monocytes Absolute: 1 10*3/uL — ABNORMAL HIGH (ref 0.00–0.90)
Neutrophils %: 70.1 % — ABNORMAL HIGH (ref 50.0–65.0)
Neutrophils %: 71.8 % — ABNORMAL HIGH (ref 50.0–65.0)
Neutrophils Absolute: 9.5 10*3/uL — ABNORMAL HIGH (ref 1.5–7.5)
Neutrophils Absolute: 10 10*3/uL — ABNORMAL HIGH (ref 1.5–7.5)
Platelets: 316 10*3/uL (ref 130–400)
Platelets: 293 10*3/uL (ref 130–400)
RBC: 4.91 M/uL (ref 4.70–6.10)
RBC: 4.58 M/uL — ABNORMAL LOW (ref 4.70–6.10)
RDW: 12.8 % (ref 11.5–14.5)
RDW: 13 % (ref 11.5–14.5)
WBC: 13.5 10*3/uL — ABNORMAL HIGH (ref 4.8–10.8)
WBC: 13.9 10*3/uL — ABNORMAL HIGH (ref 4.8–10.8)

## 2022-10-02 LAB — RESPIRATORY PANEL, MOLECULAR, WITH COVID-19
Adenovirus by PCR: NOT DETECTED
Bordetella parapertussis by PCR: NOT DETECTED
Bordetella pertussis by PCR: NOT DETECTED
Chlamydophilia pneumoniae by PCR: NOT DETECTED
Coronavirus 229E by PCR: NOT DETECTED
Coronavirus HKU1 by PCR: NOT DETECTED
Coronavirus NL63 by PCR: NOT DETECTED
Coronavirus OC43 by PCR: NOT DETECTED
Human Metapneumovirus by PCR: NOT DETECTED
Human Rhinovirus/Enterovirus by PCR: NOT DETECTED
Influenza A by PCR: NOT DETECTED
Influenza B by PCR: NOT DETECTED
Mycoplasma pneumoniae by PCR: NOT DETECTED
Parainfluenza Virus 1 by PCR: NOT DETECTED
Parainfluenza Virus 2 by PCR: NOT DETECTED
Parainfluenza Virus 3 by PCR: NOT DETECTED
Parainfluenza Virus 4 by PCR: NOT DETECTED
Respiratory Syncytial Virus by PCR: NOT DETECTED
SARS-CoV-2, PCR: NOT DETECTED

## 2022-10-02 LAB — COMPREHENSIVE METABOLIC PANEL
ALT: 38 U/L (ref 5–41)
AST: 28 U/L (ref 5–40)
Albumin: 4 g/dL (ref 3.5–5.2)
Alkaline Phosphatase: 98 U/L (ref 40–130)
Anion Gap: 13 mmol/L (ref 7–19)
BUN: 10 mg/dL (ref 6–20)
CO2: 22 mmol/L (ref 22–29)
Calcium: 8.8 mg/dL (ref 8.6–10.0)
Chloride: 105 mmol/L (ref 98–111)
Creatinine: 1.1 mg/dL (ref 0.5–1.2)
Est, Glom Filt Rate: >60 (ref >60)
Glucose: 152 mg/dL — ABNORMAL HIGH (ref 74–109)
Potassium: 3.9 mmol/L (ref 3.5–5.0)
Sodium: 140 mmol/L (ref 136–145)
Total Bilirubin: 0.5 mg/dL (ref 0.2–1.2)
Total Protein: 7 g/dL (ref 6.6–8.7)

## 2022-10-02 LAB — ANTI-XA, HEPARIN
Anti-XA Unfrac Heparin: <0.1 [IU]/mL — ABNORMAL LOW (ref 0.30–0.70)
Anti-XA Unfrac Heparin: 0.54 [IU]/mL (ref 0.30–0.70)
Anti-XA Unfrac Heparin: 0.43 [IU]/mL (ref 0.30–0.70)

## 2022-10-02 LAB — LACTATE, SEPSIS
Lactic Acid, Sepsis: 1.5 mg/dL (ref 0.5–1.9)
Lactic Acid, Sepsis: 0.9 mg/dL (ref 0.5–1.9)

## 2022-10-02 LAB — ECHOCARDIOGRAM 2D WO COLOR DOPPLER COMPLETE: Left Ventricular Ejection Fraction: 58

## 2022-10-02 LAB — D-DIMER, QUANTITATIVE: D-Dimer, Quant: 1.39 ug{FEU}/mL — ABNORMAL HIGH (ref 0.00–0.48)

## 2022-10-02 LAB — PROTIME-INR
INR: 0.97 (ref 0.88–1.18)
Protime: 12.6 s (ref 12.0–14.6)

## 2022-10-02 LAB — APTT: aPTT: 30.6 s (ref 26.0–36.2)

## 2022-10-02 LAB — TROPONIN: Troponin, High Sensitivity: 16 ng/L (ref 0–22)

## 2022-10-02 MED ORDER — CALCIUM CARBONATE ANTACID 500 MG PO CHEW
500 | Freq: Three times a day (TID) | ORAL | Status: DC | PRN
Start: 2022-10-02 — End: 2022-10-04

## 2022-10-02 MED ORDER — POTASSIUM CHLORIDE CRYS ER 20 MEQ PO TBCR
20 | ORAL | Status: DC | PRN
Start: 2022-10-02 — End: 2022-10-04

## 2022-10-02 MED ORDER — POLYETHYLENE GLYCOL 3350 17 G PO PACK
17 | Freq: Every day | ORAL | Status: DC | PRN
Start: 2022-10-02 — End: 2022-10-04

## 2022-10-02 MED ORDER — PERFLUTREN LIPID MICROSPHERE IV SUSP
Freq: Once | INTRAVENOUS | Status: AC | PRN
Start: 2022-10-02 — End: 2022-10-02
  Administered 2022-10-02: 15:00:00 1.5 mL via INTRAVENOUS

## 2022-10-02 MED ORDER — MAGNESIUM SULFATE 2000 MG/50 ML IVPB PREMIX
2 | INTRAVENOUS | Status: DC | PRN
Start: 2022-10-02 — End: 2022-10-04

## 2022-10-02 MED ORDER — FAMOTIDINE 20 MG PO TABS
20 | Freq: Every day | ORAL | Status: DC
Start: 2022-10-02 — End: 2022-10-04
  Administered 2022-10-02 – 2022-10-03 (×2): 20 mg via ORAL

## 2022-10-02 MED ORDER — IOPAMIDOL 76 % IV SOLN
76 | Freq: Once | INTRAVENOUS | Status: AC | PRN
Start: 2022-10-02 — End: 2022-10-01
  Administered 2022-10-02: 03:00:00 70 mL via INTRAVENOUS

## 2022-10-02 MED ORDER — FAMOTIDINE 20 MG PO TABS
20 | Freq: Every day | ORAL | Status: DC | PRN
Start: 2022-10-02 — End: 2022-10-04

## 2022-10-02 MED ORDER — HEPARIN SODIUM (PORCINE) 1000 UNIT/ML IJ SOLN
1000 UNIT/ML | INTRAMUSCULAR | Status: AC | PRN
Start: 2022-10-02 — End: 2022-10-03

## 2022-10-02 MED ORDER — NORMAL SALINE FLUSH 0.9 % IV SOLN
0.9 | Freq: Two times a day (BID) | INTRAVENOUS | Status: DC
Start: 2022-10-02 — End: 2022-10-04
  Administered 2022-10-04 (×2): 10 mL via INTRAVENOUS

## 2022-10-02 MED ORDER — HEPARIN SOD (PORCINE) IN D5W 100 UNIT/ML IV SOLN
100 UNIT/ML | INTRAVENOUS | Status: AC
Start: 2022-10-02 — End: 2022-10-03
  Administered 2022-10-02 (×2): 18 [IU]/kg/h via INTRAVENOUS
  Administered 2022-10-02 – 2022-10-03 (×2): 17.2 [IU]/kg/h via INTRAVENOUS

## 2022-10-02 MED ORDER — SODIUM CHLORIDE 0.9 % IV SOLN
0.9 | INTRAVENOUS | Status: DC | PRN
Start: 2022-10-02 — End: 2022-10-04

## 2022-10-02 MED ORDER — ACETAMINOPHEN 325 MG PO TABS
325 | ORAL | Status: DC | PRN
Start: 2022-10-02 — End: 2022-10-04

## 2022-10-02 MED ORDER — ONDANSETRON HCL 4 MG/2ML IJ SOLN
4 | Freq: Four times a day (QID) | INTRAMUSCULAR | Status: DC | PRN
Start: 2022-10-02 — End: 2022-10-04

## 2022-10-02 MED ORDER — MELATONIN 5 MG PO TBDP
5 | Freq: Every evening | ORAL | Status: DC | PRN
Start: 2022-10-02 — End: 2022-10-04

## 2022-10-02 MED ORDER — NORMAL SALINE FLUSH 0.9 % IV SOLN
0.9 | INTRAVENOUS | Status: DC | PRN
Start: 2022-10-02 — End: 2022-10-04

## 2022-10-02 MED ORDER — POTASSIUM BICARB-CITRIC ACID 20 MEQ PO TBEF
20 | ORAL | Status: DC | PRN
Start: 2022-10-02 — End: 2022-10-04

## 2022-10-02 MED ORDER — HEPARIN SODIUM (PORCINE) 1000 UNIT/ML IJ SOLN
1000 | Freq: Once | INTRAMUSCULAR | Status: AC
Start: 2022-10-02 — End: 2022-10-01
  Administered 2022-10-02: 04:00:00 7620 [IU]/kg via INTRAVENOUS

## 2022-10-02 MED ORDER — POTASSIUM CHLORIDE 10 MEQ/100ML IV SOLN
10 | INTRAVENOUS | Status: DC | PRN
Start: 2022-10-02 — End: 2022-10-04

## 2022-10-02 MED ORDER — ONDANSETRON 4 MG PO TBDP
4 | Freq: Three times a day (TID) | ORAL | Status: DC | PRN
Start: 2022-10-02 — End: 2022-10-04

## 2022-10-02 MED FILL — HEPARIN SOD (PORCINE) IN D5W 100 UNIT/ML IV SOLN: 100 UNIT/ML | INTRAVENOUS | Qty: 250

## 2022-10-02 MED FILL — HEPARIN SODIUM (PORCINE) 1000 UNIT/ML IJ SOLN: 1000 UNIT/ML | INTRAMUSCULAR | Qty: 10

## 2022-10-02 MED FILL — FAMOTIDINE 20 MG PO TABS: 20 MG | ORAL | Qty: 1

## 2022-10-02 NOTE — Plan of Care (Signed)
 Problem: Safety - Adult  Goal: Free from fall injury  10/02/2022 2351 by Randy Stagger, RN  Outcome: Progressing  10/02/2022 1711 by Chuckie Perkins, RN  Outcome: Progressing     Problem: ABCDS Injury Assessment  Goal: Absence of physical injury  Outcome: Progressing

## 2022-10-02 NOTE — Consults (Signed)
 Vascular Surgery Consultation    I was consulted to see the patient for bilateral PE    HPI    This is a 52 yo Caucasian man with at least 2 month history of coughing spells and shortness of breath with any activity. Symptoms worsened at daughter's house and he nearly passed out, so he came to the hospital where CTA chest revealed right side distal main pulmonary artery embolism, left subsegmental pulmonary embolism, no right heart strain. I reviewed report and images.    The patient had SVT more than 10 years ago and DVT treated with lovenox for 2-3 months around 6 years ago.    No chest pain, hemoptysis, or leg pain/swelling.    Current Medical History    Danny Ruiz is a 52 y.o. male with the following history reviewed and recorded in [employer]:  Patient Active Problem List    Diagnosis Date Noted    GERD (gastroesophageal reflux disease) 10/02/2022    Tobacco abuse 10/02/2022    History of deep vein thrombosis 10/02/2022    Pulmonary embolism (HCC) 10/01/2022     Current Facility-Administered Medications   Medication Dose Route Frequency Provider Last Rate Last Admin    polyethylene glycol (GLYCOLAX ) packet 17 g  17 g Oral Daily PRN Claudean Charleston, MD        melatonin disintegrating tablet 5 mg  5 mg Oral Nightly PRN Claudean Charleston, MD        calcium  carbonate (TUMS) chewable tablet 500 mg  500 mg Oral TID PRN Claudean Charleston, MD        sodium chloride  flush 0.9 % injection 5-40 mL  5-40 mL IntraVENous 2 times per day Claudean Charleston, MD        sodium chloride  flush 0.9 % injection 5-40 mL  5-40 mL IntraVENous PRN Claudean Charleston, MD        0.9 % sodium chloride  infusion   IntraVENous PRN Claudean Charleston, MD        potassium chloride  (KLOR-CON  M) extended release tablet 40 mEq  40 mEq Oral PRN Claudean Charleston, MD        Or    potassium bicarb-citric acid  (EFFER-K) effervescent tablet 40 mEq  40 mEq Oral PRN Claudean Charleston, MD        Or    potassium chloride  10 mEq/100 mL IVPB (Peripheral Line)  10 mEq IntraVENous  PRN Claudean Charleston, MD        magnesium  sulfate 2000 mg in 50 mL IVPB premix  2,000 mg IntraVENous PRN Claudean Charleston, MD        ondansetron  (ZOFRAN -ODT) disintegrating tablet 4 mg  4 mg Oral Q8H PRN Claudean Charleston, MD        Or    ondansetron  (ZOFRAN ) injection 4 mg  4 mg IntraVENous Q6H PRN Claudean Charleston, MD        acetaminophen  (TYLENOL ) tablet 650 mg  650 mg Oral Q4H PRN Claudean Charleston, MD        famotidine  (PEPCID ) tablet 20 mg  20 mg Oral Joaquin Claudean, Charleston, MD   20 mg at 10/02/22 1750    famotidine  (PEPCID ) tablet 20 mg  20 mg Oral Daily PRN Claudean Charleston, MD        heparin  (porcine) injection 7,620 Units  80 Units/kg IntraVENous PRN Carroll, Jordan, PA        heparin  (porcine) injection 3,810 Units  40 Units/kg IntraVENous PRN Carroll, Jordan, PA        heparin  25,000 units  in dextrose  5% 250 mL (premix) infusion  5-30 Units/kg/hr IntraVENous Continuous Carroll, Jordan, PA 16.4 mL/hr at 10/03/22 0430 17.2 Units/kg/hr at 10/03/22 0430     Allergies: Patient has no known allergies.  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     Family History   Problem Relation Age of Onset    No Known Problems Mother     No Known Problems Father     No Known Problems Brother     No Known Problems Half-Sister     No Known Problems Half-Sister     No Known Problems Daughter     No Known Problems Daughter     No Known Problems Son      Social History     Tobacco Use    Smoking status: Every Day     Types: Cigarettes    Smokeless tobacco: Never    Tobacco comments:     Started at age 8, has had a PPD on avrg since he began, smoked 40 pack-years   Substance Use Topics    Alcohol use: Not Currently     Comment: drank in younger  years on the weekends, never heavy       Review of Systems    See HPI  See H and P  All other review of systems are negative.    Physical Exam    Constitutional - well developed, well nourished.  Coughing, on oxygen NC  Neck- ROM appears normal, no tracheal deviation.  Cardiovascular - Regular rate and rhythm.    Extremities - No cyanosis, clubbing, or significant edema.  No signs atheroembolic event.  Pulmonary - mild labored breathing.  mild respiratory distress.    GI - Abdomen - soft, non tender, bowel sounds X 4 quadrants.  No guarding or rebound tenderness.  No distension or palpable mass.  Neurologic - alert and oriented X 3.  Physiologic.  Skin - warm, dry, and intact.  No rash, erythema, or pallor.  Psychiatric - mood, affect, and behavior appear normal.  Judgment and thought processes appear normal.    Radiology/Vascular lab tests:    EXAM: CHEST CTA WITH CONTRAST (PULMONARY ARTERY)     HISTORY: Shortness of breath.  Elevated D-dimer.     TECHNIQUE: CTA acquisition of the chest from the thoracic inlet to the upper abdomen following IV contrast administration timed to filling of the pulmonary artery.  3D/MIP/VR images were utilized.     COMPARISON: None.     FINDINGS:  Lines, Tubes, Devices: None.     Pulmonary arteries:  - Diagnostic quality: Adequate.  - Emboli: Multiple filling defects in the pulmonary arteries consistent with thromboemboli in the distal right main pulmonary artery multiple segmental and subsegmental branches of the right upper and middle lobes in the left lower lobe.  - Right ventricle/Left ventricle ratio (normal <0.9): Normal.  - Main pulmonary artery: Normal caliber.     Lung Parenchyma, Pleura, and Airways: Mild thickening of the peripheral airways and scattered mucus plugging. No dense airspace consolidation. No suspicious nodule/mass. Dependent atelectasis in the lower lobes.  No pleural effusion or pneumothorax. 1 cm   subpleural nodule at the left lower lobe base on axial image  80, coronal image 83.     Thoracic Inlet, Mediastinum, and Hila: Thyroid gland is unremarkable. No lymphadenopathy.     Heart, Vessels, and Pericardium: Normal caliber aorta. No visible coronary artery calcifications. No cariomegaly. No pericardial effusion.     Bones and Soft Tissues: No acute osseous abnormality. No mass or adenopathy.     Upper Abdomen: 2 cm cyst noted in the right lobe of the liver.  Post left nephrectomy.     IMPRESSION:  Multiple bilateral pulmonary emboli as described above. No evidence of right heart strain.     1 cm subpleural nodule in the left lower lobe could be post infectious/inflammatory or neoplastic.  Recommend a follow-up chest CT in 3 months to reevaluate.     Bronchiolitis.     Communication: M. Bean conveyed the urgent result to J. Carroll on 10/01/2022 at 2233.      All CT scans are performed using dose optimization techniques as appropriate to the performed exam and include   at least one of the following: Automated exposure control, adjustment of the mA and/or kV according to size, and the use of iterative reconstruction technique.      ______________________________________   Electronically signed by: DONNICE FRESHWATER M.D.  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 No additional DVT or SVT.    Assessment    1. Hypoxia    2. Multiple subsegmental pulmonary emboli without acute cor pulmonale (HCC)      He has bilateral PE, much more on the right than left side. He has no right heart strain. Likely, PE is at least 2 months old.    No acute DVT.     PE source from legs and it's gone or abdominal/pelvic veins.      Plan    Patient wants to avoid CTV for fear of kidney failure with only one kidney and last night CTA chest. Consider CTV abd/pelvis to look for source of PE once kidney recovers from CTA chest.    Continue heparin  drip for a couple of days to see if shortness of breath  improves.    If no improvement on anticoagulation alone, would need referral to tertiary medical center to consider pulmonary embolectomy (I don't think EKOs or suction thrombectomy would benefit the patient much because of the chronicity of the PE-symptoms for more than two months).    If dyspnea and cough improve with heparin , lifelong eliquis       I would only place an inferior vena cava (IVC) filter if the patient develops a contraindication to anticoagulation and has to stop anticoagulation or fails anticoagulation.  I discussed this with the patient and/or their family members.    No real need for F/U with me as outpatient as DVT is chronic. He can f/u with PCP

## 2022-10-02 NOTE — Progress Notes (Signed)
Vascular lab preliminary results.  Bilateral lower extremity venous duplex scan performed.   (+) DVT. Chronic/partial thrombus noted in Rt Gastroc. No additional DVT visualized.   No evidence of SVT or reflux noted at this time.   Final report pending.

## 2022-10-02 NOTE — Consults (Addendum)
 MEDICAL ONCOLOGY CONSULTATION    Pt Name: Danny Ruiz  MRN: [id number]  Birthdate: 1971/06/15  Date of evaluation: 10/02/2022    REASON FOR CONSULTATION: Pulmonary embolism  REQUESTING PHYSICIAN: Hospitalist    History Obtained From:   patient, electronic medical record    HISTORY OF PRESENT ILLNESS:  Danny Ruiz was seen by me on 10/02/2022.  I was consulted for DVT/PE.  The patient presented to the ER department with complaints of shortness of breath for a few weeks.  The patient denies any recent trauma, surgery, prolonged immobilization.  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  He received Lovenox for his first episode and Eliquis  x 3 months for his second episode.  The patient does not have a primary care.  He has not had any age-appropriate cancer screening.  The patient is a daily smoker since age 12.  The patient denies any weight loss.  Denies any hematuria, melena, hematochezia or hematemesis.  10/01/2022-CT pulmonary protocol remarkable for-Multiple filling defects in the pulmonary arteries consistent with thromboemboli in the distal right main pulmonary artery multiple segmental and subsegmental branches of the right upper and middle lobes in the left lower lobe. - Right ventricle/Left ventricle ratio (normal <0.9): Normal. - Main pulmonary artery: Normal caliber.  Lung Parenchyma, Pleura, and Airways: Mild thickening of the peripheral airways and scattered mucus plugging. No dense airspace consolidation. No suspicious nodule/mass. Dependent atelectasis in the lower lobes.  No pleural effusion or pneumothorax. 1 cm  subpleural nodule at the left lower lobe base on axial image 80, coronal image 83.  Thoracic Inlet, Mediastinum, and Hila: Thyroid gland is unremarkable. No lymphadenopathy.  Heart, Vessels, and Pericardium: Normal caliber aorta. No visible coronary artery calcifications. No cariomegaly. No pericardial effusion.  Bones and Soft Tissues: No acute  osseous abnormality. No mass or adenopathy.  Upper Abdomen: 2 cm cyst noted in the right lobe of the liver.  Post left nephrectomy.  Impression: 1 cm subpleural nodule in the left lower lobe could be post infectious/inflammatory or neoplastic.  Recommend a follow-up chest CT in 3 months to reevaluate.  Bronchiolitis.   10/01/21- 2D echo-normal LVEF 55-60%. No regional wall motion. Mild concentric left ventricular hypertrophy. Normal  diastolic function. Normal right ventricular size with preserved RV function (TAPSE 23 mm).  Normal bi-atrial size.  No clinically significant valvular stenosis or aortic regurgitation. Aortic root and ascending aorta dimensions are within normal limits.  The IVC is normal. The rhythm is sinus.  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  No additional DVT visualized.  No evidence of SVT or reflux noted at this time.  Patient was started on IV heparin .  Awaiting vascular consultation.            Past Medical History:    Past Medical History:   Diagnosis Date    DVT (deep venous thrombosis) (HCC)     had on two occasions in the past    GERD (gastroesophageal reflux disease)     Pulmonary embolism (HCC) 10/01/2022    Tobacco abuse        Past Surgical History:    Past Surgical History:   Procedure Laterality Date    KIDNEY REMOVAL Left     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       Social History:    Marital status: Married  Smoking status: Current smoker, 1 pack a  day since age 64  ETOH status: No  Resides: [place], [place]     Family History:   Family History   Problem Relation Age of Onset    No Known Problems Mother     No Known Problems Father     No Known Problems Brother     No Known Problems Half-Sister     No Known Problems Half-Sister     No Known Problems Daughter     No Known Problems Daughter     No Known Problems Son        Current Hospital Medications:    Current Facility-Administered  Medications   Medication Dose Route Frequency Provider Last Rate Last Admin    polyethylene glycol (GLYCOLAX ) packet 17 g  17 g Oral Daily PRN Claudean Charleston, MD        melatonin disintegrating tablet 5 mg  5 mg Oral Nightly PRN Claudean Charleston, MD        calcium  carbonate (TUMS) chewable tablet 500 mg  500 mg Oral TID PRN Claudean Charleston, MD        sodium chloride  flush 0.9 % injection 5-40 mL  5-40 mL IntraVENous 2 times per day Claudean Charleston, MD        sodium chloride  flush 0.9 % injection 5-40 mL  5-40 mL IntraVENous PRN Claudean Charleston, MD        0.9 % sodium chloride  infusion   IntraVENous PRN Claudean Charleston, MD        potassium chloride  (KLOR-CON  M) extended release tablet 40 mEq  40 mEq Oral PRN Claudean Charleston, MD        Or    potassium bicarb-citric acid  (EFFER-K) effervescent tablet 40 mEq  40 mEq Oral PRN Claudean Charleston, MD        Or    potassium chloride  10 mEq/100 mL IVPB (Peripheral Line)  10 mEq IntraVENous PRN Claudean Charleston, MD        magnesium  sulfate 2000 mg in 50 mL IVPB premix  2,000 mg IntraVENous PRN Claudean Charleston, MD        ondansetron  (ZOFRAN -ODT) disintegrating tablet 4 mg  4 mg Oral Q8H PRN Claudean Charleston, MD        Or    ondansetron  (ZOFRAN ) injection 4 mg  4 mg IntraVENous Q6H PRN Claudean Charleston, MD        acetaminophen  (TYLENOL ) tablet 650 mg  650 mg Oral Q4H PRN Claudean Charleston, MD        famotidine  (PEPCID ) tablet 20 mg  20 mg Oral Joaquin Claudean, Charleston, MD        famotidine  (PEPCID ) tablet 20 mg  20 mg Oral Daily PRN Claudean Charleston, MD        heparin  (porcine) injection 7,620 Units  80 Units/kg IntraVENous PRN Carroll, Jordan, PA        heparin  (porcine) injection 3,810 Units  40 Units/kg IntraVENous PRN Carroll, Jordan, PA        heparin  25,000 units in dextrose  5% 250 mL (premix) infusion  5-30 Units/kg/hr IntraVENous Continuous Carroll, Jordan, PA 17.2 mL/hr at 10/01/22 2305 18 Units/kg/hr at 10/01/22 2305       Allergies: No Known Allergies      Subjective   REVIEW OF SYSTEMS:    CONSTITUTIONAL: no fever, no night sweats,  fatigue;  LUNGS: no cough, no hemoptysis, no wheeze,  shortness of breath;  CARDIOVASCULAR: no palpitation, no chest pain,  shortness of breath;  GI: no abdominal pain, no nausea, no vomiting, no  diarrhea, no constipation;  GUS: no dysuria, no hematuria, no frequency or urgency, no nephrolithiasis;  MUSCULOSKELETAL: no joint pain, no swelling, no stiffness;  ENDOCRINE: no polyuria, no polydipsia, no cold or heat intolerance;  HEMATOLOGY: no easy bruising or bleeding, no history of clotting disorder;  DERMATOLOGY: no skin rash, no eczema, no pruritus;  NEUROLOGY: no syncope, no seizures, no numbness or tingling of hands, no numbness or tingling of feet, no paresis;    Objective   BP 132/84   Pulse 87   Temp 98.4 F (36.9 C) (Oral)   Resp 22   Ht 1.854 m (6' 1)   Wt 88.2 kg (194 lb 7 oz)   SpO2 97%   BMI 25.65 kg/m   PHYSICAL EXAM:  CONSTITUTIONAL: Alert, appropriate, no acute distress  NECK: Supple, no masses.  No palpable thyroid mass  CHEST/LUNGS: CTA bilaterally, normal respiratory effort   CARDIOVASCULAR: RRR, no murmurs.  No lower extremity edema  ABDOMEN: soft non-tender, active bowel sounds, no HSM.  No palpable masses  EXTREMITIES: warm, full ROM in all 4 extremities, no focal weakness.  SKIN: warm, dry with no rashes or lesions  LYMPH: No cervical, clavicular, axillary, or inguinal lymphadenopathy  NEUROLOGIC: follows commands, non focal       LABORATORY RESULTS REVIEWED/ANALYZED BY ME:  Recent Labs     10/02/22  0412 10/01/22  2023   WBC 13.9* 13.5*   HGB 13.5* 14.6   HCT 41.9* 43.6   MCV 91.5 88.8   PLT 293 316       Lab Results   Component Value Date    NA 138 10/02/2022    K 4.2 10/02/2022    CL 104 10/02/2022    CO2 21 (L) 10/02/2022    BUN 8 10/02/2022    CREATININE 1.1 10/02/2022    GLUCOSE 120 (H) 10/02/2022    CALCIUM  8.3 (L) 10/02/2022    PROT 7.0 10/01/2022    LABALBU 4.0 10/01/2022    BILITOT 0.5 10/01/2022    ALKPHOS 98 10/01/2022    AST 28  10/01/2022    ALT 38 10/01/2022    LABGLOM >60 10/02/2022       Lab Results   Component Value Date    INR 0.97 10/01/2022    PROTIME 12.6 10/01/2022       RADIOLOGY STUDIES REPORT/REVIEWED AND INTERPRETED BY ME:  10/01/2022-CT pulmonary protocol remarkable for-Multiple filling defects in the pulmonary arteries consistent with thromboemboli in the distal right main pulmonary artery multiple segmental and subsegmental branches of the right upper and middle lobes in the left lower lobe. - Right ventricle/Left ventricle ratio (normal <0.9): Normal. - Main pulmonary artery: Normal caliber.  Lung Parenchyma, Pleura, and Airways: Mild thickening of the peripheral airways and scattered mucus plugging. No dense airspace consolidation. No suspicious nodule/mass. Dependent atelectasis in the lower lobes.  No pleural effusion or pneumothorax. 1 cm  subpleural nodule at the left lower lobe base on axial image 80, coronal image 83.  Thoracic Inlet, Mediastinum, and Hila: Thyroid gland is unremarkable. No lymphadenopathy.  Heart, Vessels, and Pericardium: Normal caliber aorta. No visible coronary artery calcifications. No cariomegaly. No pericardial effusion.  Bones and Soft Tissues: No acute osseous abnormality. No mass or adenopathy.  Upper Abdomen: 2 cm cyst noted in the right lobe of the liver.  Post left nephrectomy.  Impression: 1 cm subpleural nodule in the left lower lobe could be post infectious/inflammatory or neoplastic.  Recommend a follow-up chest CT in 3 months  to reevaluate.  Bronchiolitis.   10/01/21- 2D echo-normal LVEF 55-60%. No regional wall motion. Mild concentric left ventricular hypertrophy. Normal  diastolic function. Normal right ventricular size with preserved RV function (TAPSE 23 mm).  Normal bi-atrial size.  No clinically significant valvular stenosis or aortic regurgitation. Aortic root and ascending aorta dimensions are within normal limits.  The IVC is normal. The rhythm is sinus.        My  interpretation: Lobar/segmental pulmonary embolism      ASSESSMENT:  Unprovoked bilateral pulmonary embolism in the segmental.  March 2024  No clear trigger factor  Prior history of DVT x 2  Troponin 16  2D echo showed no RV dilatation  Will recommend lifetime anticoagulation      DVT right lower extremity  Remote history of DVT right lower extremity      PLAN:  Continue IV heparin  until vascular consultation  Antiphospholipid panel for tomorrow  Will likely to transition to Eliquis  in the morning if no intervention by vascular  Lupus anticoagulant  Beta-2 glycoprotein IgG/IgM  Anticardiolipin IgG/IgM  proBNP    I have seen, examined and reviewed this patient medication list, appropriate labs and imaging studies. I reviewed relevant medical records and others physician's notes. I discussed the plans of care with the patient. I answered all the questions to the patient's satisfaction. I have also reviewed the chief complaint (CC) and part of the history (History of Present Illness (HPI), Past Family Social History [facility]), or Review of Systems (ROS) and made changes when appropriated.       (Please note that portions of this note were completed with a voice recognition program. Efforts were made to edit the dictations but occasionally words are mis-transcribed.)        Evan CHRISTELLA Cipro, MD    10/02/22  10:36 AM

## 2022-10-02 NOTE — Plan of Care (Signed)
Problem: Safety - Adult  Goal: Free from fall injury  Outcome: Progressing

## 2022-10-02 NOTE — Progress Notes (Signed)
 Danny Ruiz      Patient:  Danny Ruiz  Date of Birth: [birth year]  Date of Service: 10/02/2022  MRN: [id number]   Acct: [id number]   Primary Care Physician: Danny Daved CROME, MD  Advance Directive: Full Code  Admit Date: 10/01/2022       Ruiz Day: 1  Portions of this note have been copied forward, however, changed to reflect the most current clinical status of this patient.    CHIEF COMPLAINT Shortness of breath    SUBJECTIVE:  He states that his shortness of breath is unchanged. He remains on a 2L NC. His tachycardia has resolved. VSS.     CUMULATIVE Ruiz STAY:  The patient is a 52 yo male with a PMH of tobacco abuse, GERD, DVT and left nephrectomy who presented to [facility] ED on 10/01/2022 with complaints of shortness of breath.  He reported that he had been short of breath for 2 to 3 days but it worsened on the day of admission.  He reported a nonproductive cough.  He denied any swelling of his lower extremities.  Denied fever or chills.  He states that he has not been on anticoagulants for several years.  Reported chills.  Denied fever.  Denied hemoptysis.  Further ED workup revealed-glucose 152.  WBC 13.9 H&H within normal limits.  D-dimer 1.39.  CTA pulmonary with contrast showed multiple bilateral pulmonary emboli with no evidence of right heart strain.  1 cm subpleural nodule in the left lower lobe.  Bronchiolitis.  RPP negative.  Heparin  drip was initiated in the ED.  Patient was admitted to [facility] medicine with a vascular surgery consult.    He remains on 9 2 L nasal cannula.  2D echo and bilateral venous ultrasounds ordered and pending.  Continue to await vascular surgery opinion.  Heme-onc consulted due to recurrent blood clots. Labs and vital signs remain relatively unchanged.     Review of Systems:   Review of Systems   Constitutional:  Positive for fatigue.   Respiratory:  Positive for cough and shortness of breath.        14 point review of systems is negative except as  specifically addressed above.      Objective:   VITALS:  BP 132/84   Pulse 87   Temp 98.4 F (36.9 C) (Oral)   Resp 22   Ht 1.854 m (6' 1)   Wt 88.2 kg (194 lb 7 oz)   SpO2 97%   BMI 25.65 kg/m   24HR INTAKE/OUTPUT:  No intake or output data in the 24 hours ending 10/02/22 1527    Physical Exam  Vitals reviewed.   Constitutional:       General: He is not in acute distress.     Appearance: He is not ill-appearing.   HENT:      Head: Normocephalic and atraumatic.   Eyes:      Pupils: Pupils are equal, round, and reactive to light.   Cardiovascular:      Rate and Rhythm: Normal rate and regular rhythm.      Pulses: Normal pulses.      Heart sounds: Normal heart sounds.   Pulmonary:      Effort: Tachypnea present. No respiratory distress.      Breath sounds: Decreased breath sounds present. No wheezing, rhonchi or rales.      Comments: Minimal diminishment throughout. Shallow breathing  Abdominal:      General: Bowel sounds are normal. There is no distension.  Palpations: Abdomen is soft.      Tenderness: There is no abdominal tenderness.   Musculoskeletal:         General: No swelling, tenderness or deformity. Normal range of motion.      Cervical back: Normal range of motion and neck supple. No muscular tenderness.   Skin:     General: Skin is warm and dry.      Findings: No bruising or lesion.   Neurological:      General: No focal deficit present.      Mental Status: He is alert and oriented to person, place, and time.             Medications:      sodium chloride       heparin  (PORCINE) Infusion 17.2 Units/kg/hr (10/02/22 1136)      sodium chloride  flush  5-40 mL IntraVENous 2 times per day    famotidine   20 mg Oral Dinner     polyethylene glycol, melatonin, calcium  carbonate, sodium chloride  flush, sodium chloride , potassium chloride  **OR** potassium alternative oral replacement **OR** potassium chloride , magnesium  sulfate, ondansetron  **OR** ondansetron , acetaminophen , famotidine , heparin  (porcine),  heparin  (porcine)  ADULT DIET; Regular; 4 carb choices (60 gm/meal)     Lab and other Data:     Recent Labs     10/01/22  2023 10/02/22  0412   WBC 13.5* 13.9*   HGB 14.6 13.5*   PLT 316 293     Recent Labs     10/01/22  2023 10/02/22  0412   NA 140 138   K 3.9 4.2   CL 105 104   CO2 22 21*   BUN 10 8   CREATININE 1.1 1.1   GLUCOSE 152* 120*     Recent Labs     10/01/22  2023   AST 28   ALT 38   BILITOT 0.5   ALKPHOS 98     Troponin T: No results for input(s): TROPONINI in the last 72 hours.  Pro-BNP: No results for input(s): BNP in the last 72 hours.  INR:   Recent Labs     10/01/22  2023   INR 0.97     UA:No results for input(s): NITRITE, COLORU, PHUR, LABCAST, WBCUA, RBCUA, MUCUS, TRICHOMONAS, YEAST, BACTERIA, CLARITYU, SPECGRAV, LEUKOCYTESUR, UROBILINOGEN, BILIRUBINUR, BLOODU, GLUCOSEU, AMORPHOUS in the last 72 hours.    Invalid input(s): KETONESU  A1C: No results for input(s): LABA1C in the last 72 hours.  ABG:No results for input(s): PHART, PCO2ART, PO2ART, HCO3ART, BEART, HGBAE, O2SATART, CARBOXHGBART in the last 72 hours.    RAD:   CTA PULMONARY W CONTRAST    Result Date: 10/01/2022  Multiple bilateral pulmonary emboli as described above. No evidence of right heart strain.  1 cm subpleural nodule in the left lower lobe could be post infectious/inflammatory or neoplastic.  Recommend a follow-up chest CT in 3 months to reevaluate.  Bronchiolitis.  Communication: M. Bean conveyed the urgent result to J. Carroll on 10/01/2022 at 2233.  All CT scans are performed using dose optimization techniques as appropriate to the performed exam and include at least one of the following: Automated exposure control, adjustment of the mA and/or kV according to size, and the use of iterative reconstruction technique.  ______________________________________ Electronically signed by: DONNICE FRESHWATER M.D. Date:     10/01/2022 Time:    22:27     XR CHEST PORTABLE    Result  Date: 10/01/2022   1. Bibasilar atelectasis.    SABRA  ______________________________________ Electronically signed by: LONNI BERKSHIRE D.O. Date:     10/01/2022 Time:    21:30        Echo: Ordered and pending      Assessment/Plan   Principal Problem:    Pulmonary embolism (HCC)   -Continue heparin  gtt   -Vascular surgery consulted   -2D echo ordered and pending   -BLE venous doppler US  ordered and pending   -O2 per protocol   -Monitor labs frequently   -Monitor vital signs     Active Problems:    GERD (gastroesophageal reflux disease)   -Pepcid  daily      Tobacco abuse   -Counseled on the importance of cessation      History of deep vein thrombosis   -Due to recurrent blood clots consult hem/onc    -Ongoing recommendations appreciated    Resolved Problems:    * No resolved [facility] problems. *      DVT Prophylaxis: Heparin  gtt    GI prophylaxis:  Pepcid     Disposition: TBD    Suzen Mallick, APRN, 10/02/2022 3:27 PM

## 2022-10-03 LAB — CBC WITH AUTO DIFFERENTIAL
Basophils %: 0.7 % (ref 0.0–1.0)
Basophils Absolute: 0.1 10*3/uL (ref 0.00–0.20)
Eosinophils %: 5.1 % — ABNORMAL HIGH (ref 0.0–5.0)
Eosinophils Absolute: 0.5 10*3/uL (ref 0.00–0.60)
Hematocrit: 39.1 % — ABNORMAL LOW (ref 42.0–52.0)
Hemoglobin: 13.5 g/dL — ABNORMAL LOW (ref 14.0–18.0)
Immature Granulocytes #: 0 10*3/uL
Lymphocytes %: 25.1 % (ref 20.0–40.0)
Lymphocytes Absolute: 2.6 10*3/uL (ref 1.1–4.5)
MCH: 29.7 pg (ref 27.0–31.0)
MCHC: 34.5 g/dL (ref 33.0–37.0)
MCV: 85.9 fL (ref 80.0–94.0)
MPV: 8.9 fL — ABNORMAL LOW (ref 9.4–12.4)
Monocytes %: 9.5 % (ref 0.0–10.0)
Monocytes Absolute: 1 10*3/uL — ABNORMAL HIGH (ref 0.00–0.90)
Neutrophils %: 59.2 % (ref 50.0–65.0)
Neutrophils Absolute: 6 10*3/uL (ref 1.5–7.5)
Platelets: 265 10*3/uL (ref 130–400)
RBC: 4.55 M/uL — ABNORMAL LOW (ref 4.70–6.10)
RDW: 12.4 % (ref 11.5–14.5)
WBC: 10.2 10*3/uL (ref 4.8–10.8)

## 2022-10-03 LAB — BASIC METABOLIC PANEL W/ REFLEX TO MG FOR LOW K
Anion Gap: 11 mmol/L (ref 7–19)
BUN: 8 mg/dL (ref 6–20)
CO2: 24 mmol/L (ref 22–29)
Calcium: 8.2 mg/dL — ABNORMAL LOW (ref 8.6–10.0)
Chloride: 101 mmol/L (ref 98–111)
Creatinine: 1.1 mg/dL (ref 0.5–1.2)
Est, Glom Filt Rate: >60 (ref >60)
Glucose: 125 mg/dL — ABNORMAL HIGH (ref 74–109)
Potassium reflex Magnesium: 4.3 mmol/L (ref 3.5–5.0)
Sodium: 136 mmol/L (ref 136–145)

## 2022-10-03 LAB — ANTI-XA, UNFRACTIONATED HEPARIN: Anti-XA Unfrac Heparin: 0.3 IU/mL (ref 0.30–0.70)

## 2022-10-03 LAB — BRAIN NATRIURETIC PEPTIDE: Pro-BNP: 46 pg/mL (ref 0–124)

## 2022-10-03 MED ORDER — APIXABAN 5 MG PO TABS
5 | Freq: Two times a day (BID) | ORAL | Status: DC
Start: 2022-10-03 — End: 2022-10-04
  Administered 2022-10-03 – 2022-10-04 (×3): 10 mg via ORAL

## 2022-10-03 MED ORDER — APIXABAN 5 MG PO TABS
5 | Freq: Two times a day (BID) | ORAL | Status: DC
Start: 2022-10-03 — End: 2022-10-04

## 2022-10-03 MED ORDER — MONTELUKAST SODIUM 4 MG PO CHEW
4 | Freq: Every evening | ORAL | Status: DC
Start: 2022-10-03 — End: 2022-10-04
  Administered 2022-10-04: 01:00:00 4 mg via ORAL

## 2022-10-03 MED FILL — ELIQUIS 5 MG PO TABS: 5 MG | ORAL | Qty: 2

## 2022-10-03 MED FILL — HEPARIN SOD (PORCINE) IN D5W 100 UNIT/ML IV SOLN: 100 UNIT/ML | INTRAVENOUS | Qty: 250

## 2022-10-03 MED FILL — FAMOTIDINE 20 MG PO TABS: 20 MG | ORAL | Qty: 1

## 2022-10-03 NOTE — Progress Notes (Signed)
[facility]      Patient:  Danny Ruiz  Date of Birth: 1970-08-19  Date of Service: 10/03/2022  MRN: [id number]   Acct: [id number]   Primary Care Physician: Jeannette How, MD  Advance Directive: Full Code  Admit Date: 10/01/2022       Hospital Day: 2  Portions of this note have been copied forward, however, changed to reflect the most current clinical status of this patient.    CHIEF COMPLAINT Shortness of breath    SUBJECTIVE:  She states that his shortness of breath has significantly improved, but continues to have a cough, which does make him short of breath. Tachypnea improved. Remains on 2L NC.    CUMULATIVE HOSPITAL STAY:  The patient is a 52 yo male with a PMH of tobacco abuse, GERD, DVT and left nephrectomy who presented to [facility] ED on 10/01/2022 with complaints of shortness of breath.  He reported that he had been short of breath for 2 to 3 days but it worsened on the day of admission.  He reported a nonproductive cough.  He denied any swelling of his lower extremities.  Denied fever or chills.  He states that he has not been on anticoagulants for several years.  Reported chills.  Denied fever.  Denied hemoptysis.  Further ED workup revealed-glucose 152.  WBC 13.9 H&H within normal limits.  D-dimer 1.39.  CTA pulmonary with contrast showed multiple bilateral pulmonary emboli with no evidence of right heart strain.  1 cm subpleural nodule in the left lower lobe.  Bronchiolitis.  RPP negative.  Heparin drip was initiated in the ED.  Patient was admitted to hospital medicine with a vascular surgery consult.    He remains on 90's 2 L nasal cannula.  2D [id number], Normal diastolic function. No clinical valvular abnormalities. 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 No DVT in the LLE.   Vascular surgery recommends continuing heparin gtt and teritary transfer if SOA does not improve.  Heme-onc hypercoagulable labs ordered.  Transitioned to Eliquis on 10/03/2022  Labs and vital signs remain relatively unchanged. Singular added due to c/o congestion-nasal causing cough    Review of Systems:   Review of Systems   Constitutional:  Positive for fatigue.   HENT:  Positive for congestion.    Respiratory:  Positive for cough and shortness of breath.        14 point review of systems is negative except as specifically addressed above.      Objective:   VITALS:  BP 117/76   Pulse (!) 102   Temp 97.9 F (36.6 C) (Temporal)   Resp 18   Ht 1.854 m ('6\' 1"'$ )   Wt 90.3 kg (199 lb)   SpO2 93%   BMI 26.25 kg/m   24HR INTAKE/OUTPUT:    Intake/Output Summary (Last 24 hours) at 10/03/2022 1223  Last data filed at 10/03/2022 0949  Gross per 24 hour   Intake --   Output 800 ml   Net -800 ml       Physical Exam  Vitals reviewed.   Constitutional:       General: He is not in acute distress.     Appearance: He is not ill-appearing.   HENT:      Head: Normocephalic and atraumatic.   Eyes:      Pupils: Pupils are equal, round, and reactive to light.   Cardiovascular:      Rate and Rhythm:  Normal rate and regular rhythm.      Pulses: Normal pulses.      Heart sounds: Normal heart sounds.   Pulmonary:      Effort: Pulmonary effort is normal. No tachypnea or respiratory distress.      Breath sounds: Decreased breath sounds present. No wheezing, rhonchi or rales.      Comments: Minimal diminishment throughout, but improve air movement from previous exam  Abdominal:      General: Bowel sounds are normal. There is no distension.      Palpations: Abdomen is soft.      Tenderness: There is no abdominal tenderness.   Musculoskeletal:         General: No swelling, tenderness or deformity. Normal range of motion.      Cervical back: Normal range of motion and neck supple. No muscular tenderness.   Skin:     General: Skin is warm and dry.      Findings: No bruising or lesion.   Neurological:      General: No focal deficit present.      Mental Status: He is alert and  oriented to person, place, and time.             Medications:      sodium chloride        apixaban  10 mg Oral BID    Followed by    Derrill Memo ON 10/10/2022] apixaban  5 mg Oral BID    montelukast sodium  4 mg Oral Nightly    sodium chloride flush  5-40 mL IntraVENous 2 times per day    famotidine  20 mg Oral Dinner     polyethylene glycol, melatonin, calcium carbonate, sodium chloride flush, sodium chloride, potassium chloride **OR** potassium alternative oral replacement **OR** potassium chloride, magnesium sulfate, ondansetron **OR** ondansetron, acetaminophen, famotidine  ADULT DIET; Regular; 4 carb choices (60 gm/meal)     Lab and other Data:     Recent Labs     10/01/22  2023 10/02/22  0412 10/03/22  0533   WBC 13.5* 13.9* 10.2   HGB 14.6 13.5* 13.5*   PLT 316 293 265       Recent Labs     10/01/22  2023 10/02/22  0412 10/03/22  0533   NA 140 138 136   K 3.9 4.2 4.3   CL 105 104 101   CO2 22 21* 24   BUN '10 8 8   '$ CREATININE 1.1 1.1 1.1   GLUCOSE 152* 120* 125*       Recent Labs     10/01/22  2023   AST 28   ALT 38   BILITOT 0.5   ALKPHOS 98       Troponin T: No results for input(s): "TROPONINI" in the last 72 hours.  Pro-BNP: No results for input(s): "BNP" in the last 72 hours.  INR:   Recent Labs     10/01/22  2023   INR 0.97       UA:No results for input(s): "NITRITE", "COLORU", "PHUR", "LABCAST", "WBCUA", "RBCUA", "MUCUS", "TRICHOMONAS", "YEAST", "BACTERIA", "CLARITYU", "SPECGRAV", "LEUKOCYTESUR", "UROBILINOGEN", "BILIRUBINUR", "BLOODU", "GLUCOSEU", "AMORPHOUS" in the last 72 hours.    Invalid input(s): "KETONESU"  A1C: No results for input(s): "LABA1C" in the last 72 hours.  ABG:No results for input(s): "PHART", "PCO2ART", "PO2ART", "HCO3ART", "BEART", "HGBAE", "O2SATART", "CARBOXHGBART" in the last 72 hours.    RAD:   CTA PULMONARY W CONTRAST    Result Date: 10/01/2022  Multiple bilateral pulmonary emboli  as described above. No evidence of right heart strain.  1 cm subpleural nodule in the left lower lobe could  be post infectious/inflammatory or neoplastic.  Recommend a follow-up chest CT in 3 months to reevaluate.  Bronchiolitis.  Communication: M. Bean conveyed the urgent result to J. Carroll on 10/01/2022 at 2233.  All CT scans are performed using dose optimization techniques as appropriate to the performed exam and include at least one of the following: Automated exposure control, adjustment of the mA and/or kV according to size, and the use of iterative reconstruction technique.  ______________________________________ Electronically signed by: Carney Harder M.D. Date:     10/01/2022 Time:    22:27     XR CHEST PORTABLE    Result Date: 10/01/2022   1. Bibasilar atelectasis.    .   ______________________________________ Electronically signed by: Bobbye Charleston D.O. Date:     10/01/2022 Time:    21:30        Echo:   Type of Study      TTE procedure:ECHO 2D W/DOPPLER/COLOR/CONTRAST.     Study Location: Echo Lab  Technical Quality: Adequate visualization     Patient Status: Inpatient     Contrast Medium: Definity. Amount - 5 ml     HR: 86 bpm BP: 122/83 mmHg     Indications:Pulmonary embolus.      Conclusions      Summary   Normal left ventricular size with preserved LV function and an estimated   ejection fraction of approximately 55-60%. No regional wall motion   abnormalities. Mild concentric left ventricular hypertrophy. Normal   diastolic function.   Normal right ventricular size with preserved RV function (TAPSE 23 mm).   Normal bi-atrial size.   No clinically significant valvular stenosis or aortic regurgitation.   Aortic root and ascending aorta dimensions are within normal limits.   The IVC is normal.   The rhythm is sinus.      Signature      ----------------------------------------------------------------   Electronically signed by Sheppard Coil Simone(Interpreting physician)   on 10/02/2022 12:43 PM   ----------------------------------------------------------------       Assessment/Plan   Principal Problem:     Pulmonary embolism ([place])   -Transition heparin gtt to Eliquis   -Vascular surgery following ongoing recommendations appreciated   -2D echo 123456, Normal diastolic function. No clinical valvular abnormalities.   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 No DVT in the LLE.    -Consider tertiary transfer in SOA does not improve   -O2 per protocol   -Monitor labs frequently   -Monitor vital signs     Active Problems:    GERD (gastroesophageal reflux disease)   -Pepcid daily      Tobacco abuse   -Counseled on the importance of cessation      History of deep vein thrombosis   -Due to recurrent blood clots consult hem/onc    -Hypercoagulable labs   -Transition to po Eliquis    Nasal congestion    -Singular 4 mg at HS    Resolved Problems:    * No resolved hospital problems. *      DVT Prophylaxis: Eliquis    GI prophylaxis:  Pepcid    Disposition: TBD    Ronnell Guadalajara, APRN, 10/03/2022 12:23 PM

## 2022-10-03 NOTE — Progress Notes (Signed)
MEDICAL ONCOLOGY PROGRESS NOTE    Pt Name: Danny Ruiz  MRN: [id number]  Birthdate: 06/03/1971  Date of evaluation: 10/03/2022    Subjective-patient denies any bleeding.  He has occasional bouts of cough.  He is still on O2 supplement.  Discussed with patient bedside RN.  Heparin is stopped.  Eliquis will be given starting now.    HISTORY OF PRESENT ILLNESS:    Diagnosis  Unprovoked DVT/PE, March 2024  History of provoked PE x 2 treated with Lovenox first episode and Eliquis second episode x 3 months      Hematology history  Danny Ruiz was seen by me on 10/02/2022.  I was consulted for DVT/PE.  The patient presented to the ER department with complaints of shortness of breath for a few weeks.  The patient denies any recent trauma, surgery, prolonged immobilization.  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  He received Lovenox for his first episode and Eliquis x 3 months for his second episode.  The patient does not have a primary care.  He has not had any age-appropriate cancer screening.  The patient is a daily smoker since age 89.  The patient denies any weight loss.  Denies any hematuria, melena, hematochezia or hematemesis.  10/01/2022-CT pulmonary protocol remarkable for-Multiple filling defects in the pulmonary arteries consistent with thromboemboli in the distal right main pulmonary artery multiple segmental and subsegmental branches of the right upper and middle lobes in the left lower lobe. - Right ventricle/Left ventricle ratio (normal <0.9): Normal. - Main pulmonary artery: Normal caliber.  Lung Parenchyma, Pleura, and Airways: Mild thickening of the peripheral airways and scattered mucus plugging. No dense airspace consolidation. No suspicious nodule/mass. Dependent atelectasis in the lower lobes.  No pleural effusion or pneumothorax. 1 cm  subpleural nodule at the left lower lobe base on axial image 80, coronal image 83.  Thoracic Inlet, Mediastinum, and Hila:  Thyroid gland is unremarkable. No lymphadenopathy.  Heart, Vessels, and Pericardium: Normal caliber aorta. No visible coronary artery calcifications. No cariomegaly. No pericardial effusion.  Bones and Soft Tissues: No acute osseous abnormality. No mass or adenopathy.  Upper Abdomen: 2 cm cyst noted in the right lobe of the liver.  Post left nephrectomy.  Impression: 1 cm subpleural nodule in the left lower lobe could be post infectious/inflammatory or neoplastic.  Recommend a follow-up chest CT in 3 months to reevaluate.  Bronchiolitis.   10/01/21- 2D echo-normal LVEF 55-60%. No regional wall motion. Mild concentric left ventricular hypertrophy. Normal  diastolic function. Normal right ventricular size with preserved RV function (TAPSE 23 mm).  Normal bi-atrial size.  No clinically significant valvular stenosis or aortic regurgitation. Aortic root and ascending aorta dimensions are within normal limits.  The IVC is normal. The rhythm is sinus.  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  No additional DVT visualized.  No evidence of SVT or reflux noted at this time.  10/01/2022-patient was started on IV heparin.  Awaiting vascular consultation.            Past Medical History:    Past Medical History:   Diagnosis Date    DVT (deep venous thrombosis) ([facility])     had on two occasions in the past    GERD (gastroesophageal reflux disease)     Pulmonary embolism ([place]) 10/01/2022    Tobacco abuse        Past Surgical History:  Past Surgical History:   Procedure Laterality Date    KIDNEY REMOVAL Left     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"       Social History:    Marital status: Married  Smoking status: Current smoker, 1 pack a day since age 77  ETOH status: No  Resides: [place], [place]    Family History:   Family History   Problem Relation Age of Onset    No Known Problems Mother     No Known Problems Father     No Known Problems  Brother     No Known Problems Half-Sister     No Known Problems Half-Sister     No Known Problems Daughter     No Known Problems Daughter     No Known Problems Son        Current Hospital Medications:    Current Facility-Administered Medications   Medication Dose Route Frequency Provider Last Rate Last Admin    polyethylene glycol (GLYCOLAX) packet 17 g  17 g Oral Daily PRN Patience Musca, MD        melatonin disintegrating tablet 5 mg  5 mg Oral Nightly PRN Patience Musca, MD        calcium carbonate (TUMS) chewable tablet 500 mg  500 mg Oral TID PRN Patience Musca, MD        sodium chloride flush 0.9 % injection 5-40 mL  5-40 mL IntraVENous 2 times per day Patience Musca, MD        sodium chloride flush 0.9 % injection 5-40 mL  5-40 mL IntraVENous PRN Patience Musca, MD        0.9 % sodium chloride infusion   IntraVENous PRN Patience Musca, MD        potassium chloride (KLOR-CON M) extended release tablet 40 mEq  40 mEq Oral PRN Patience Musca, MD        Or    potassium bicarb-citric acid (EFFER-K) effervescent tablet 40 mEq  40 mEq Oral PRN Patience Musca, MD        Or    potassium chloride 10 mEq/100 mL IVPB (Peripheral Line)  10 mEq IntraVENous PRN Patience Musca, MD        magnesium sulfate 2000 mg in 50 mL IVPB premix  2,000 mg IntraVENous PRN Patience Musca, MD        ondansetron (ZOFRAN-ODT) disintegrating tablet 4 mg  4 mg Oral Q8H PRN Patience Musca, MD        Or    ondansetron [facility]) injection 4 mg  4 mg IntraVENous Q6H PRN Patience Musca, MD        acetaminophen (TYLENOL) tablet 650 mg  650 mg Oral Q4H PRN Patience Musca, MD        famotidine (PEPCID) tablet 20 mg  20 mg Oral Lennie Odor, Herbie Hamilton, MD   20 mg at 10/02/22 1750    famotidine (PEPCID) tablet 20 mg  20 mg Oral Daily PRN Patience Musca, MD        heparin (porcine) injection 7,620 Units  80 Units/kg IntraVENous PRN Carroll, [place], PA        heparin (porcine) injection 3,810 Units  40 Units/kg IntraVENous PRN Carroll, [place], PA         heparin 25,000 units in dextrose 5% 250 mL (premix) infusion  5-30 Units/kg/hr IntraVENous Continuous Carroll, [place], PA 16.4 mL/hr at 10/03/22 0430 17.2 Units/kg/hr at 10/03/22 0430       Allergies: No Known Allergies  Objective   BP 116/73   Pulse 97   Temp 97.9 F (36.6 C) (Temporal)   Resp 22   Ht 1.854 m ('6\' 1"'$ )   Wt 90.3 kg (199 lb)   SpO2 90%   BMI 26.25 kg/m   PHYSICAL EXAM:  CONSTITUTIONAL: Alert, appropriate, no acute distress  NECK: Supple, no masses.  No palpable thyroid mass  CHEST/LUNGS: CTA bilaterally, normal respiratory effort   CARDIOVASCULAR: RRR, no murmurs.  No lower extremity edema  ABDOMEN: soft non-tender, active bowel sounds, no HSM.  No palpable masses  EXTREMITIES: warm, full ROM in all 4 extremities, no focal weakness.  SKIN: warm, dry with no rashes or lesions  LYMPH: No cervical, clavicular, axillary, or inguinal lymphadenopathy  NEUROLOGIC: follows commands, non focal       LABORATORY RESULTS REVIEWED/ANALYZED BY ME:  Recent Labs     10/03/22  0533 10/02/22  0412 10/01/22  2023   WBC 10.2 13.9* 13.5*   HGB 13.5* 13.5* 14.6   HCT 39.1* 41.9* 43.6   MCV 85.9 91.5 88.8   PLT 265 293 316       Lab Results   Component Value Date    NA 136 10/03/2022    K 4.3 10/03/2022    CL 101 10/03/2022    CO2 24 10/03/2022    BUN 8 10/03/2022    CREATININE 1.1 10/03/2022    GLUCOSE 125 (H) 10/03/2022    CALCIUM 8.2 (L) 10/03/2022    PROT 7.0 10/01/2022    LABALBU 4.0 10/01/2022    BILITOT 0.5 10/01/2022    ALKPHOS 98 10/01/2022    AST 28 10/01/2022    ALT 38 10/01/2022    LABGLOM >60 10/03/2022       Lab Results   Component Value Date    INR 0.97 10/01/2022    PROTIME 12.6 10/01/2022       RADIOLOGY STUDIES REPORT/REVIEWED AND INTERPRETED BY ME:  10/01/2022-CT pulmonary protocol remarkable for-Multiple filling defects in the pulmonary arteries consistent with thromboemboli in the distal right main pulmonary artery multiple segmental and subsegmental branches of the right upper and middle lobes  in the left lower lobe. - Right ventricle/Left ventricle ratio (normal <0.9): Normal. - Main pulmonary artery: Normal caliber.  Lung Parenchyma, Pleura, and Airways: Mild thickening of the peripheral airways and scattered mucus plugging. No dense airspace consolidation. No suspicious nodule/mass. Dependent atelectasis in the lower lobes.  No pleural effusion or pneumothorax. 1 cm  subpleural nodule at the left lower lobe base on axial image 80, coronal image 83.  Thoracic Inlet, Mediastinum, and Hila: Thyroid gland is unremarkable. No lymphadenopathy.  Heart, Vessels, and Pericardium: Normal caliber aorta. No visible coronary artery calcifications. No cariomegaly. No pericardial effusion.  Bones and Soft Tissues: No acute osseous abnormality. No mass or adenopathy.  Upper Abdomen: 2 cm cyst noted in the right lobe of the liver.  Post left nephrectomy.  Impression: 1 cm subpleural nodule in the left lower lobe could be post infectious/inflammatory or neoplastic.  Recommend a follow-up chest CT in 3 months to reevaluate.  Bronchiolitis.   10/01/21- 2D echo-normal LVEF 55-60%. No regional wall motion. Mild concentric left ventricular hypertrophy. Normal  diastolic function. Normal right ventricular size with preserved RV function (TAPSE 23 mm).  Normal bi-atrial size.  No clinically significant valvular stenosis or aortic regurgitation. Aortic root and ascending aorta dimensions are within normal limits.  The IVC is normal. The rhythm is sinus.  My interpretation: Lobar/segmental pulmonary embolism      ASSESSMENT:  Unprovoked bilateral pulmonary embolism  March 2024  No clear trigger factor  Prior history of provoked DVT x 2  Troponin 16, proBNP 46  2D echo showed no RV dilatation, preserved LVEF.  Mild concentric LVH.  Normal diastolic function.  RVSP 27 mmHg  PESI score: 102 points. Class III, Intermediate Risk: 3.2-7.1% 30-day mortality in this group.  Will recommend lifetime anticoagulation  10/03/2022-start  Eliquis 10 mg p.o. twice daily x 7 days followed by 5 mg p.o. twice daily.  Vascular not planning any intervention at this time.  Follow-up appointment will be arranged follow-up in clinic in 4 weeks with Amy Baker for further thrombophilia workup  Creatinine 1.1, EGFR> 60      Lupus anticoagulant-pending  Beta-2 glycoprotein IgG/IgM-pending  Anticardiolipin IgG/IgM-pending    DVT right lower extremity  Remote history of DVT right lower extremity  Disposition-May need to watch the patient in house until dyspnea improves for another day or 2      PLAN:  Discontinue IV heparin now  Transition to Eliquis 10 mg p.o. twice daily x 7 days followed by 5 mg p.o. twice daily lifetime  Office will call on Monday about appointment with Amy Baker in 4 weeks  Discussed bedside RN  Consider O2 evaluation before discharge        (Please note that portions of this note were completed with a voice recognition program. Efforts were made to edit the dictations but occasionally words are mis-transcribed.)        Tona Sensing, MD    10/03/22  7:59 AM

## 2022-10-04 LAB — BASIC METABOLIC PANEL W/ REFLEX TO MG FOR LOW K
Anion Gap: 12 mmol/L (ref 7–19)
BUN: 13 mg/dL (ref 6–20)
CO2: 23 mmol/L (ref 22–29)
Calcium: 8.5 mg/dL — ABNORMAL LOW (ref 8.6–10.0)
Chloride: 104 mmol/L (ref 98–111)
Creatinine: 1.1 mg/dL (ref 0.5–1.2)
Est, Glom Filt Rate: >60 (ref >60)
Glucose: 119 mg/dL — ABNORMAL HIGH (ref 74–109)
Potassium reflex Magnesium: 4.2 mmol/L (ref 3.5–5.0)
Sodium: 139 mmol/L (ref 136–145)

## 2022-10-04 LAB — CBC WITH AUTO DIFFERENTIAL
Basophils %: 0.6 % (ref 0.0–1.0)
Basophils Absolute: 0.1 10*3/uL (ref 0.00–0.20)
Eosinophils %: 7.2 % — ABNORMAL HIGH (ref 0.0–5.0)
Eosinophils Absolute: 0.7 10*3/uL — ABNORMAL HIGH (ref 0.00–0.60)
Hematocrit: 41.1 % — ABNORMAL LOW (ref 42.0–52.0)
Hemoglobin: 13.4 g/dL — ABNORMAL LOW (ref 14.0–18.0)
Immature Granulocytes #: 0.1 10*3/uL
Lymphocytes %: 25 % (ref 20.0–40.0)
Lymphocytes Absolute: 2.3 10*3/uL (ref 1.1–4.5)
MCH: 29.6 pg (ref 27.0–31.0)
MCHC: 32.6 g/dL — ABNORMAL LOW (ref 33.0–37.0)
MCV: 90.9 fL (ref 80.0–94.0)
MPV: 9.4 fL (ref 9.4–12.4)
Monocytes %: 10.6 % — ABNORMAL HIGH (ref 0.0–10.0)
Monocytes Absolute: 1 10*3/uL — ABNORMAL HIGH (ref 0.00–0.90)
Neutrophils %: 56.1 % (ref 50.0–65.0)
Neutrophils Absolute: 5.2 10*3/uL (ref 1.5–7.5)
Platelets: 280 10*3/uL (ref 130–400)
RBC: 4.52 M/uL — ABNORMAL LOW (ref 4.70–6.10)
RDW: 12.3 % (ref 11.5–14.5)
WBC: 9.3 10*3/uL (ref 4.8–10.8)

## 2022-10-04 LAB — EKG 12-LEAD
P Axis: 70 degrees
P-R Interval: 132 ms
Q-T Interval: 338 ms
QRS Duration: 86 ms
QTc Calculation (Bazett): 411 ms
T Axis: 54 degrees

## 2022-10-04 MED ORDER — APIXABAN 5 MG PO TABS
5 MG | ORAL_TABLET | Freq: Two times a day (BID) | ORAL | 0 refills | Status: DC
Start: 2022-10-04 — End: 2022-10-21

## 2022-10-04 MED ORDER — FAMOTIDINE 20 MG PO TABS
20 MG | ORAL_TABLET | Freq: Every day | ORAL | 0 refills | Status: DC
Start: 2022-10-04 — End: 2022-10-21

## 2022-10-04 MED ORDER — MONTELUKAST SODIUM 4 MG PO CHEW
4 MG | ORAL_TABLET | Freq: Every evening | ORAL | 0 refills | Status: DC
Start: 2022-10-04 — End: 2022-10-21

## 2022-10-04 MED ORDER — APIXABAN STARTER PACK 5 MG PO TBPK
5 | ORAL_TABLET | ORAL | 0 refills | Status: DC
Start: 2022-10-04 — End: 2022-10-04

## 2022-10-04 MED FILL — MONTELUKAST SODIUM 4 MG PO CHEW: 4 MG | ORAL | Qty: 1

## 2022-10-04 MED FILL — ELIQUIS 5 MG PO TABS: 5 MG | ORAL | Qty: 2

## 2022-10-04 NOTE — Progress Notes (Signed)
10/04/22 1353   Resting (Room Air)   SpO2 92   During Walk (Room Air)   SpO2 93   Walk/Assistance Device Ambulation   Rate of Dyspnea 0   After Walk   SpO2 93   Rate of Dyspnea 0   Does the Patient Qualify for Home O2 No

## 2022-10-04 NOTE — Plan of Care (Signed)
Problem: Safety - Adult  Goal: Free from fall injury  Outcome: Progressing  Flowsheets (Taken 10/03/2022 1113 by Seward Meth, LPN)  Free From Fall Injury: Instruct family/caregiver on patient safety     Problem: ABCDS Injury Assessment  Goal: Absence of physical injury  Outcome: Progressing  Flowsheets (Taken 10/03/2022 1113 by Seward Meth, LPN)  Absence of Physical Injury: Implement safety measures based on patient assessment

## 2022-10-04 NOTE — Progress Notes (Signed)
Vascular Surgery -  Abel Presto, MD M.D.   Daily Progress Note    Pt Name: Danny Ruiz  Medical Record Number: [id number]  Date of Birth 08/19/1970   Today's Date: 10/04/2022    Chief Complaint:  Chief Complaint   Patient presents with    Shortness of Breath     Onset a few days, worse today; non-productive cough for a few days    Chills       SUBJECTIVE:     Patient was seen and examined.  His breathing is better.  He still has an occasional coughing spell but he is off oxygen and when he ambulates he does not feel dizzy or lightheaded.    OBJECTIVE:     Patient Vitals for the past 24 hrs:   BP Temp Temp src Pulse Resp SpO2 Weight   10/04/22 1100 -- -- -- -- -- 93 % --   10/04/22 0822 108/70 98.7 F (37.1 C) Temporal 67 18 96 % --   10/04/22 0655 -- -- -- -- -- -- 86.4 kg (190 lb 6.4 oz)   10/04/22 0645 -- -- -- -- -- 95 % --   10/03/22 2331 122/72 98.2 F (36.8 C) -- 85 22 96 % --         Intake/Output Summary (Last 24 hours) at 10/04/2022 1208  Last data filed at 10/03/2022 1942  Gross per 24 hour   Intake 10 ml   Output --   Net 10 ml       In: 10 [I.V.:10]  Out: 800 [Urine:800]    I/O last 3 completed shifts:  In: 10 [I.V.:10]  Out: 800 [Urine:800]         Wt Readings from Last 3 Encounters:   10/04/22 86.4 kg (190 lb 6.4 oz)        Body mass index is 25.12 kg/m.     Diet: ADULT DIET; Regular; 4 carb choices (60 gm/meal)        MEDS:     Scheduled Meds:   apixaban  10 mg Oral BID    Followed by    Derrill Memo ON 10/10/2022] apixaban  5 mg Oral BID    montelukast  4 mg Oral Nightly    sodium chloride flush  5-40 mL IntraVENous 2 times per day    famotidine  20 mg Oral Dinner     Continuous Infusions:   sodium chloride       PRN Meds:polyethylene glycol, 17 g, Daily PRN  melatonin, 5 mg, Nightly PRN  calcium carbonate, 500 mg, TID PRN  sodium chloride flush, 5-40 mL, PRN  sodium chloride, , PRN  potassium chloride, 40 mEq, PRN   Or  potassium alternative oral replacement, 40 mEq, PRN   Or  potassium chloride, 10  mEq, PRN  magnesium sulfate, 2,000 mg, PRN  ondansetron, 4 mg, Q8H PRN   Or  ondansetron, 4 mg, Q6H PRN  acetaminophen, 650 mg, Q4H PRN  famotidine, 20 mg, Daily PRN          PHYSICAL EXAM:     CONSTITUTIONAL: Alert and oriented times 3, no acute distress and cooperative to examination.  LUNGS: Chest expands equally bilaterally upon respiration, no accessory muscle used.   CARDIOVASCULAR: Regular rate and rhythm.  EXTREMITY: No significant swelling in the leg    LABS:       CBC:   Recent Labs     10/02/22  0412 10/03/22  0533 10/04/22  0349  WBC 13.9* 10.2 9.3   RBC 4.58* 4.55* 4.52*   HGB 13.5* 13.5* 13.4*   HCT 41.9* 39.1* 41.1*   MCV 91.5 85.9 90.9   MCH 29.5 29.7 29.6   MCHC 32.2* 34.5 32.6*   RDW 13.0 12.4 12.3   PLT 293 265 280   MPV 9.2* 8.9* 9.4      Last 3 CMP:   Recent Labs     10/01/22  2023 10/02/22  0412 10/03/22  0533 10/04/22  0349   NA 140 138 136 139   K 3.9 4.2 4.3 4.2   CL 105 104 101 104   CO2 22 21* 24 23   BUN '10 8 8 13   '$ CREATININE 1.1 1.1 1.1 1.1   GLUCOSE 152* 120* 125* 119*   CALCIUM 8.8 8.3* 8.2* 8.5*   PROT 7.0  --   --   --    LABALBU 4.0  --   --   --    BILITOT 0.5  --   --   --    ALKPHOS 98  --   --   --    AST 28  --   --   --    ALT 38  --   --   --       Troponin: No results for input(s): "TROPONINI" in the last 72 hours.  Calcium:   Lab Results   Component Value Date/Time    CALCIUM 8.5 10/04/2022 03:49 AM    CALCIUM 8.2 10/03/2022 05:33 AM    CALCIUM 8.3 10/02/2022 04:12 AM      Ionized Calcium: No results found for: "IONCA"   Lipids: No results for input(s): "CHOL", "HDL" in the last 72 hours.    Invalid input(s): "LDLCALCU"  INR:   Recent Labs     10/01/22  2023   INR 0.97     Lactic Acid: No results found for: "LACTA"       DVT prophylaxis: '[]'$  Lovenox                                 '[]'$  SCDs                                 '[]'$  SQ Heparin                                 '[]'$  Encourage ambulation, low risk for DVT, no chemical or                                      mechanical  prophylaxis necessary              '[]'$  Already on Anticoagulation      RADIOLOGY:               ASSESSMENT:     Bilateral pulmonary embolism, right greater than the left side.  He is feeling better with less coughing spells and his breathing is much better.  He said no syncopal episodes or dizziness when he walks or stands.  HD # 3  Patient Active Problem List   Diagnosis    Pulmonary embolism (HCC)    GERD (gastroesophageal reflux disease)  Tobacco abuse    History of deep vein thrombosis               PLAN:     From my standpoint, it is okay for him to be discharged today or tomorrow on Eliquis.  He should follow-up with me in 4 to 6 weeks in the office with right lower extremity venous duplex.  I would recommend anticoagulation for life.   IVC filter placement if he has a problem with anticoagulation and can no longer take the anticoagulation.        Abel Presto, MD, M.D.   Electronically signed 10/04/2022 at 12:08 PM

## 2022-10-04 NOTE — Plan of Care (Signed)
Problem: Safety - Adult  Goal: Free from fall injury  10/04/2022 1447 by Colen Darling, RN  Outcome: Completed  10/04/2022 0043 by Honor Junes, RN  Outcome: Progressing  Flowsheets (Taken 10/03/2022 1113 by Seward Meth, LPN)  Free From Fall Injury: Instruct family/caregiver on patient safety     Problem: ABCDS Injury Assessment  Goal: Absence of physical injury  10/04/2022 1447 by Colen Darling, RN  Outcome: Completed  10/04/2022 0043 by Honor Junes, RN  Outcome: Progressing  Flowsheets (Taken 10/03/2022 1113 by Seward Meth, LPN)  Absence of Physical Injury: Implement safety measures based on patient assessment

## 2022-10-04 NOTE — Discharge Summary (Signed)
Danny Ruiz  DOB:  [birth year]  MRN:  [id number]    Admit date:  10/01/2022  Discharge date:  10/04/2022    Discharging Physician:  Dr. Marchia Bond    Advance Directive: Full Code    Consults: IP CONSULT TO VASCULAR SURGERY  IP CONSULT TO ONCOLOGY  IP CONSULT TO CASE MANAGEMENT     Primary Care Physician:  Jeannette How, MD    Discharge Diagnoses:  Principal Problem:    Pulmonary embolism ([place])  Active Problems:    GERD (gastroesophageal reflux disease)    Tobacco abuse    History of deep vein thrombosis  Resolved Problems:    * No resolved hospital problems. *      Portions of this note have been copied forward, however, changed to reflect the most current clinical status of this patient.    Hospital Course:   The patient is a 52 yo male with a PMH of tobacco abuse, GERD, DVT and left nephrectomy who presented to [facility] ED on 10/01/2022 with complaints of shortness of breath. He reported that he had been short of breath for 2 to 3 days but it worsened on the day of admission. He reported a nonproductive cough. He denied any swelling of his lower extremities. Denied fever or chills. He states that he has not been on anticoagulants for several years. Reported chills. Denied fever. Denied hemoptysis. Further ED workup revealed-glucose 152. WBC 13.9 H&H within normal limits. D-dimer 1.39. CTA pulmonary with contrast showed multiple bilateral pulmonary emboli with no evidence of right heart strain. 1 cm subpleural nodule in the left lower lobe. Bronchiolitis. RPP negative. Heparin drip was initiated in the ED. Patient was admitted to hospital medicine with a vascular surgery consult.     2D [id number], Normal diastolic function. No clinical valvular abnormalities. 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 No DVT in the LLE.     Vascular surgery recommended heparin gtt and monitor respiratory status. He was transitioned to Eliquis on 10/03/2022.   He was when and to room air.  Home O2 evaluation was performed which showed he does not need home oxygen.  He has been cleared by vascular surgery for discharge.  He is to follow-up with vascular surgery in 4 to 6 weeks with a venous duplex.    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  Hypercoagulable labs are pending.  He was transition to Eliquis and will be prescribed upon discharge.  He is to follow-up in 4 weeks with hematology for possible thrombophilia workup.    He is medically stable for discharge. His labs are stable. Home O2 evaluation reveals no need for home O2. He is to follow-up with hematology and oncology as recommended. He will be discharged home in stable condition.     Significant Diagnostic Studies:   VL Extremity Venous Bilateral    Result Date: 10/03/2022  Vascular Lower Extremities DVT Study Procedure  Demographics   Patient Name    Danny Ruiz  Age                  25                  W   Patient Number  [id number]          Gender               Male   Visit Number    [id number]  Interpreting         Wenda Low MD                                  Physician   Date of Birth   22-Dec-1970      Referring Physician  Wenda Low MD                                                       Ronnell Guadalajara   Accession       [id number]      [place], RVT,  Number                                               RDMS  Procedure Type of Study:   Veins:Lower Extremities DVT Study, DUPLEX VENOUS SCAN BILAT.  Indications for Study:Pulmonary embolism.  Impression   There is evidence of chronic partially recanalized deep vein thrombosis in  the right lower extremity involving the gastrocnemius vein.  There is no evidence of deep venous thrombosis (DVT) in the left lower  extremity.  There is no evidence of superficial thrombophlebitis of the bilateral  lower extremities.   Signature   ----------------------------------------------------------------  Electronically signed by  Wenda Low MD(Interpreting  physician) on 10/03/2022 08:24 AM  ----------------------------------------------------------------  Velocities are measured in cm/s ; Diameters are measured in mm Right Lower Extremities DVT Study Measurements Right 2D Measurements +------------------------------------+----------+---------------+----------+ !Location                            !Visualized!Compressibility!Thrombosis! +------------------------------------+----------+---------------+----------+ !Sapheno Femoral Junction            !Yes       !Yes            !None      ! +------------------------------------+----------+---------------+----------+ !Common Femoral                      !Yes       !Yes            !None      ! +------------------------------------+----------+---------------+----------+ !Prox Femoral                        !Yes       !Yes            !None      ! +------------------------------------+----------+---------------+----------+ !Mid Femoral                         !Yes       !Yes            !None      ! +------------------------------------+----------+---------------+----------+ !Dist Femoral                        !Yes       !Yes            !None      ! +------------------------------------+----------+---------------+----------+ !Deep Femoral                        !  Yes       !Yes            !None      ! +------------------------------------+----------+---------------+----------+ !Popliteal                           !Yes       !Yes            !None      ! +------------------------------------+----------+---------------+----------+ !SSV                                 !Yes       !Yes            !None      ! +------------------------------------+----------+---------------+----------+ !Gastroc                             !Yes       !Partial        !          ! +------------------------------------+----------+---------------+----------+ !PTV                                 !Yes       !Yes             !None      ! +------------------------------------+----------+---------------+----------+ !GSV                                 !Yes       !Yes            !None      ! +------------------------------------+----------+---------------+----------+ !ATV                                 !Yes       !Yes            !None      ! +------------------------------------+----------+---------------+----------+ !Peroneal                            !Yes       !Yes            !None      ! +------------------------------------+----------+---------------+----------+ Left Lower Extremities DVT Study Measurements Left 2D Measurements +------------------------------------+----------+---------------+----------+ !Location                            !Visualized!Compressibility!Thrombosis! +------------------------------------+----------+---------------+----------+ !Sapheno Femoral Junction            !Yes       !Yes            !None      ! +------------------------------------+----------+---------------+----------+ !Common Femoral                      !Yes       !Yes            !None      ! +------------------------------------+----------+---------------+----------+ !Prox Femoral                        !Yes       !Yes            !  None      ! +------------------------------------+----------+---------------+----------+ !Mid Femoral                         !Yes       !Yes            !None      ! +------------------------------------+----------+---------------+----------+ !Dist Femoral                        !Yes       !Yes            !None      ! +------------------------------------+----------+---------------+----------+ !Deep Femoral                        !Yes       !Yes            !None      ! +------------------------------------+----------+---------------+----------+ !Popliteal                           !Yes       !Yes            !None      ! +------------------------------------+----------+---------------+----------+ !SSV                                  !Yes       !Yes            !None      ! +------------------------------------+----------+---------------+----------+ !Gastroc                             !Yes       !Yes            !None      ! +------------------------------------+----------+---------------+----------+ !PTV                                 !Yes       !Yes            !None      ! +------------------------------------+----------+---------------+----------+ !GSV                                 !Yes       !Yes            !None      ! +------------------------------------+----------+---------------+----------+ !ATV                                 !Yes       !Yes            !None      ! +------------------------------------+----------+---------------+----------+ !Peroneal                            !Yes       !Yes            !None      ! +------------------------------------+----------+---------------+----------+    CTA PULMONARY W CONTRAST    Result Date: 10/01/2022  EXAM: CHEST CTA WITH CONTRAST (PULMONARY ARTERY)  HISTORY: Shortness of breath.  Elevated D-dimer.  TECHNIQUE: CTA acquisition of the chest from the thoracic inlet to the upper abdomen following IV contrast administration timed to filling of the pulmonary artery.  3D/MIP/VR images were utilized.  COMPARISON: None.  FINDINGS: Lines, Tubes, Devices: None.  Pulmonary arteries: - Diagnostic quality: Adequate. - Emboli: Multiple filling defects in the pulmonary arteries consistent with thromboemboli in the distal right main pulmonary artery multiple segmental and subsegmental branches of the right upper and middle lobes in the left lower lobe. - Right ventricle/Left ventricle ratio (normal <0.9): Normal. - Main pulmonary artery: Normal caliber.  Lung Parenchyma, Pleura, and Airways: Mild thickening of the peripheral airways and scattered mucus plugging. No dense airspace consolidation. No suspicious nodule/mass. Dependent atelectasis in the lower lobes.  No pleural effusion or  pneumothorax. 1 cm  subpleural nodule at the left lower lobe base on axial image 80, coronal image 83.  Thoracic Inlet, Mediastinum, and Hila: Thyroid gland is unremarkable. No lymphadenopathy.  Heart, Vessels, and Pericardium: Normal caliber aorta. No visible coronary artery calcifications. No cariomegaly. No pericardial effusion.  Bones and Soft Tissues: No acute osseous abnormality. No mass or adenopathy.  Upper Abdomen: 2 cm cyst noted in the right lobe of the liver.  Post left nephrectomy.      Multiple bilateral pulmonary emboli as described above. No evidence of right heart strain.  1 cm subpleural nodule in the left lower lobe could be post infectious/inflammatory or neoplastic.  Recommend a follow-up chest CT in 3 months to reevaluate.  Bronchiolitis.  Communication: M. Bean conveyed the urgent result to J. Carroll on 10/01/2022 at 2233.  All CT scans are performed using dose optimization techniques as appropriate to the performed exam and include at least one of the following: Automated exposure control, adjustment of the mA and/or kV according to size, and the use of iterative reconstruction technique.  ______________________________________ Electronically signed by: Carney Harder M.D. Date:     10/01/2022 Time:    22:27     XR CHEST PORTABLE    Result Date: 10/01/2022  EXAM:  CHEST ONE-VIEW  HISTORY:  Shortness of breath  COMPARISON:  None  FINDINGS:   The cardiomediastinal silhouette is normal.  The pulmonary vasculature is normal.  Minimal linear bibasilar opacity is suggested.  No pneumothoraces or pleural effusions.       1. Bibasilar atelectasis.    .   ______________________________________ Electronically signed by: Bobbye Charleston D.O. Date:     10/01/2022 Time:    21:30       Pertinent Labs:   CBC:   Recent Labs     10/02/22  0412 10/03/22  0533 10/04/22  0349   WBC 13.9* 10.2 9.3   HGB 13.5* 13.5* 13.4*   PLT 293 265 280     BMP:    Recent Labs     10/02/22  0412 10/03/22  0533 10/04/22  0349    NA 138 136 139   K 4.2 4.3 4.2   CL 104 101 104   CO2 21* 24 23   BUN '8 8 13   '$ CREATININE 1.1 1.1 1.1   GLUCOSE 120* 125* 119*     INR:   Recent Labs     10/01/22  2023   INR 0.97       Physical Exam:  Vital Signs: BP 108/70   Pulse 67   Temp 98.7 F (37.1 C) (Temporal)   Resp 18   Ht 1.854 m ('6\' 1"'$ )   Wt 86.4 kg (190 lb 6.4  oz)   SpO2 93%   BMI 25.12 kg/m   Physical Exam  Constitutional:       General: He is not in acute distress.     Appearance: He is not ill-appearing.   HENT:      Head: Normocephalic and atraumatic.   Eyes:      Pupils: Pupils are equal, round, and reactive to light.   Cardiovascular:      Rate and Rhythm: Normal rate and regular rhythm.      Pulses: Normal pulses.      Heart sounds: Normal heart sounds.   Pulmonary:      Effort: Pulmonary effort is normal. No tachypnea or respiratory distress.      Breath sounds: Decreased breath sounds present. No wheezing, rhonchi or rales.      Comments: Minimal diminishment throughout, but improve air movement from previous exam  Abdominal:      General: Bowel sounds are normal. There is no distension.      Palpations: Abdomen is soft.      Tenderness: There is no abdominal tenderness.   Musculoskeletal:         General: No swelling, tenderness or deformity. Normal range of motion.      Cervical back: Normal range of motion and neck supple. No muscular tenderness.   Skin:     General: Skin is warm and dry.      Findings: No bruising or lesion.   Neurological:      General: No focal deficit present.      Mental Status: He is alert and oriented to person, place, and time.     Discharge Medications:         Medication List        START taking these medications      apixaban 5 MG Tabs tablet  Commonly known as: Eliquis  Take 1 tablet by mouth 2 times daily     famotidine 20 MG tablet  Commonly known as: PEPCID  Take 1 tablet by mouth Daily with supper     montelukast 4 MG chewable tablet  Commonly known as: SINGULAIR  Take 1 tablet by mouth nightly                Where to Get Your Medications        These medications were sent to [facility] [address], [place]  [place], [place] [address]      Phone: [phone]   apixaban 5 MG Tabs tablet  famotidine 20 MG tablet  montelukast 4 MG chewable tablet         Discharge Instructions:   Follow up with Stewart-Jaynes, Chapman Moss, MD within 1 week of discharge for hospital follow-up  Follow-up with vascular surgery as recommended  Follow-up with hematology as recommended    Take medications as directed.  Resume activity as tolerated.    Diet: ADULT DIET; Regular; 4 carb choices (60 gm/meal)     Disposition: Patient is Stable and will be discharged to Home.    Time spent on discharge 38 minutes spent in assessing patient, reviewing medications, discussion with nursing, confirming safe discharge plan and preparation of discharge summary.    Signed:  Ronnell Guadalajara, APRN, 10/04/2022 3:45 PM

## 2022-10-04 NOTE — Progress Notes (Addendum)
MEDICAL ONCOLOGY PROGRESS NOTE    Pt Name: Danny Ruiz  MRN: [id number]  Birthdate: [birth year]  Date of evaluation: 10/04/2022    Subjective-patient denies any bleeding.  Currently on Eliquis.  Reviewed interval notes.  He feels that his breathing has improved.    HISTORY OF PRESENT ILLNESS:    Diagnosis  Unprovoked DVT/PE, March 2024  History of provoked PE x 2 treated with Lovenox first episode and Eliquis second episode x 3 months      Hematology history  Danny Ruiz was seen by me on 10/02/2022.  I was consulted for DVT/PE.  The patient presented to the ER department with complaints of shortness of breath for a few weeks.  The patient denies any recent trauma, surgery, prolonged immobilization.  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  He received Lovenox for his first episode and Eliquis x 3 months for his second episode.  The patient does not have a primary care.  He has not had any age-appropriate cancer screening.  The patient is a daily smoker since age 32.  The patient denies any weight loss.  Denies any hematuria, melena, hematochezia or hematemesis.  10/01/2022-CT pulmonary protocol remarkable for-Multiple filling defects in the pulmonary arteries consistent with thromboemboli in the distal right main pulmonary artery multiple segmental and subsegmental branches of the right upper and middle lobes in the left lower lobe. - Right ventricle/Left ventricle ratio (normal <0.9): Normal. - Main pulmonary artery: Normal caliber.  Lung Parenchyma, Pleura, and Airways: Mild thickening of the peripheral airways and scattered mucus plugging. No dense airspace consolidation. No suspicious nodule/mass. Dependent atelectasis in the lower lobes.  No pleural effusion or pneumothorax. 1 cm  subpleural nodule at the left lower lobe base on axial image 80, coronal image 83.  Thoracic Inlet, Mediastinum, and Hila: Thyroid gland is unremarkable. No lymphadenopathy.  Heart, Vessels,  and Pericardium: Normal caliber aorta. No visible coronary artery calcifications. No cariomegaly. No pericardial effusion.  Bones and Soft Tissues: No acute osseous abnormality. No mass or adenopathy.  Upper Abdomen: 2 cm cyst noted in the right lobe of the liver.  Post left nephrectomy.  Impression: 1 cm subpleural nodule in the left lower lobe could be post infectious/inflammatory or neoplastic.  Recommend a follow-up chest CT in 3 months to reevaluate.  Bronchiolitis.   10/01/21- 2D echo-normal LVEF 55-60%. No regional wall motion. Mild concentric left ventricular hypertrophy. Normal  diastolic function. Normal right ventricular size with preserved RV function (TAPSE 23 mm).  Normal bi-atrial size.  No clinically significant valvular stenosis or aortic regurgitation. Aortic root and ascending aorta dimensions are within normal limits.  The IVC is normal. The rhythm is sinus.  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  No additional DVT visualized.  No evidence of SVT or reflux noted at this time.  10/01/2022-patient was started on IV heparin.  Awaiting vascular consultation.            Past Medical History:    Past Medical History:   Diagnosis Date    DVT (deep venous thrombosis) (HCC)     had on two occasions in the past    GERD (gastroesophageal reflux disease)     Pulmonary embolism ([place]) 10/01/2022    Tobacco abuse        Past Surgical History:    Past Surgical History:   Procedure Laterality Date    KIDNEY REMOVAL  Left     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"       Social History:    Marital status: Married  Smoking status: Current smoker, 1 pack a day since age 45  ETOH status: No  Resides: [place], [place]    Family History:   Family History   Problem Relation Age of Onset    No Known Problems Mother     No Known Problems Father     No Known Problems Brother     No Known Problems Half-Sister     No Known Problems  Half-Sister     No Known Problems Daughter     No Known Problems Daughter     No Known Problems Son        Current Hospital Medications:    Current Facility-Administered Medications   Medication Dose Route Frequency Provider Last Rate Last Admin    apixaban (ELIQUIS) tablet 10 mg  10 mg Oral BID Donte Kary, Julieta Bellini, MD   10 mg at 10/04/22 0840    Followed by    Derrill Memo ON 10/10/2022] apixaban (ELIQUIS) tablet 5 mg  5 mg Oral BID Johnta Couts, Julieta Bellini, MD        montelukast (SINGULAIR) chewable tablet 4 mg  4 mg Oral Nightly Ronnell Gu[place], APRN   4 mg at 10/03/22 1942    polyethylene glycol (GLYCOLAX) packet 17 g  17 g Oral Daily PRN Patience Musca, MD        melatonin disintegrating tablet 5 mg  5 mg Oral Nightly PRN Patience Musca, MD        calcium carbonate (TUMS) chewable tablet 500 mg  500 mg Oral TID PRN Patience Musca, MD        sodium chloride flush 0.9 % injection 5-40 mL  5-40 mL IntraVENous 2 times per day Patience Musca, MD   10 mL at 10/04/22 0840    sodium chloride flush 0.9 % injection 5-40 mL  5-40 mL IntraVENous PRN Patience Musca, MD        0.9 % sodium chloride infusion   IntraVENous PRN Patience Musca, MD        potassium chloride (KLOR-CON M) extended release tablet 40 mEq  40 mEq Oral PRN Patience Musca, MD        Or    potassium bicarb-citric acid (EFFER-K) effervescent tablet 40 mEq  40 mEq Oral PRN Patience Musca, MD        Or    potassium chloride 10 mEq/100 mL IVPB (Peripheral Line)  10 mEq IntraVENous PRN Patience Musca, MD        magnesium sulfate 2000 mg in 50 mL IVPB premix  2,000 mg IntraVENous PRN Patience Musca, MD        ondansetron (ZOFRAN-ODT) disintegrating tablet 4 mg  4 mg Oral Q8H PRN Patience Musca, MD        Or    ondansetron [facility]) injection 4 mg  4 mg IntraVENous Q6H PRN Patience Musca, MD        acetaminophen (TYLENOL) tablet 650 mg  650 mg Oral Q4H PRN Patience Musca, MD        famotidine (PEPCID) tablet 20 mg  20 mg Oral Lennie Odor, Herbie [place], MD   20 mg at  10/03/22 1742    famotidine (PEPCID) tablet 20 mg  20 mg Oral Daily PRN Patience Musca, MD           Allergies: No Known Allergies    Objective   BP  108/70   Pulse 67   Temp 98.7 F (37.1 C) (Temporal)   Resp 18   Ht 1.854 m ('6\' 1"'$ )   Wt 86.4 kg (190 lb 6.4 oz)   SpO2 96%   BMI 25.12 kg/m   PHYSICAL EXAM:  CONSTITUTIONAL: Alert, appropriate, no acute distress  NECK: Supple, no masses.  No palpable thyroid mass  CHEST/LUNGS: CTA bilaterally, normal respiratory effort   CARDIOVASCULAR: RRR, no murmurs.  No lower extremity edema  ABDOMEN: soft non-tender, active bowel sounds, no HSM.  No palpable masses  EXTREMITIES: warm, full ROM in all 4 extremities, no focal weakness.  SKIN: warm, dry with no rashes or lesions  LYMPH: No cervical, clavicular, axillary, or inguinal lymphadenopathy  NEUROLOGIC: follows commands, non focal       LABORATORY RESULTS REVIEWED/ANALYZED BY ME:  Recent Labs     10/04/22  0349 10/03/22  0533 10/02/22  0412   WBC 9.3 10.2 13.9*   HGB 13.4* 13.5* 13.5*   HCT 41.1* 39.1* 41.9*   MCV 90.9 85.9 91.5   PLT 280 265 293       Lab Results   Component Value Date    NA 139 10/04/2022    K 4.2 10/04/2022    CL 104 10/04/2022    CO2 23 10/04/2022    BUN 13 10/04/2022    CREATININE 1.1 10/04/2022    GLUCOSE 119 (H) 10/04/2022    CALCIUM 8.5 (L) 10/04/2022    PROT 7.0 10/01/2022    LABALBU 4.0 10/01/2022    BILITOT 0.5 10/01/2022    ALKPHOS 98 10/01/2022    AST 28 10/01/2022    ALT 38 10/01/2022    LABGLOM >60 10/04/2022       Lab Results   Component Value Date    INR 0.97 10/01/2022    PROTIME 12.6 10/01/2022       RADIOLOGY STUDIES REPORT/REVIEWED AND INTERPRETED BY ME:  10/01/2022-CT pulmonary protocol remarkable for-Multiple filling defects in the pulmonary arteries consistent with thromboemboli in the distal right main pulmonary artery multiple segmental and subsegmental branches of the right upper and middle lobes in the left lower lobe. - Right ventricle/Left ventricle ratio (normal <0.9):  Normal. - Main pulmonary artery: Normal caliber.  Lung Parenchyma, Pleura, and Airways: Mild thickening of the peripheral airways and scattered mucus plugging. No dense airspace consolidation. No suspicious nodule/mass. Dependent atelectasis in the lower lobes.  No pleural effusion or pneumothorax. 1 cm  subpleural nodule at the left lower lobe base on axial image 80, coronal image 83.  Thoracic Inlet, Mediastinum, and Hila: Thyroid gland is unremarkable. No lymphadenopathy.  Heart, Vessels, and Pericardium: Normal caliber aorta. No visible coronary artery calcifications. No cariomegaly. No pericardial effusion.  Bones and Soft Tissues: No acute osseous abnormality. No mass or adenopathy.  Upper Abdomen: 2 cm cyst noted in the right lobe of the liver.  Post left nephrectomy.  Impression: 1 cm subpleural nodule in the left lower lobe could be post infectious/inflammatory or neoplastic.  Recommend a follow-up chest CT in 3 months to reevaluate.  Bronchiolitis.   10/01/21- 2D echo-normal LVEF 55-60%. No regional wall motion. Mild concentric left ventricular hypertrophy. Normal  diastolic function. Normal right ventricular size with preserved RV function (TAPSE 23 mm).  Normal bi-atrial size.  No clinically significant valvular stenosis or aortic regurgitation. Aortic root and ascending aorta dimensions are within normal limits.  The IVC is normal. The rhythm is sinus.        My interpretation:  Lobar/segmental pulmonary embolism      ASSESSMENT:  Unprovoked bilateral pulmonary embolism  March 2024  No clear trigger factor  Prior history of provoked DVT x 2  Troponin 16, proBNP 46  2D echo showed no RV dilatation, preserved LVEF.  Mild concentric LVH.  Normal diastolic function.  RVSP 27 mmHg  PESI score: 102 points. Class III, Intermediate Risk: 3.2-7.1% 30-day mortality in this group.  Will recommend lifetime anticoagulation  10/03/2022-start Eliquis 10 mg p.o. twice daily x 7 days followed by 5 mg p.o. twice daily.   Vascular not planning any intervention at this time.  Follow-up appointment will be arranged follow-up in clinic in 4 weeks with Amy Baker for further thrombophilia workup  Creatinine 1.1, EGFR> 60  Will we will repeat CTA chest before an appointment with Amy Baker to assess resolution of clot burden.    Lupus anticoagulant-pending  Beta-2 glycoprotein IgG/IgM-pending  Anticardiolipin IgG/IgM-pending    DVT right lower extremity  Remote history of DVT right lower extremity  Disposition-patient feels much improved today.  He could be discharged from hematology standpoint today or tomorrow.        PLAN:  Continue Eliquis 10 mg p.o. twice daily x 7 days followed by 5 mg p.o. twice daily lifetime  Office will call on Monday about appointment with Amy Baker in 4 weeks  Repeat CTA chest in 4 weeks before appointment with Etta Grandchild to assess response and need for outpatient referral to tertiary center.  Completion thrombophilia workup as outpatient  Lupus anticoagulant, beta-2 glycoprotein and anticardiolipin in process  Discussed bedside RN  Consider O2 evaluation before discharge        (Please note that portions of this note were completed with a voice recognition program. Efforts were made to edit the dictations but occasionally words are mis-transcribed.)        Tona Sensing, MD    10/04/22  8:43 AM

## 2022-10-05 ENCOUNTER — Encounter

## 2022-10-05 LAB — LUPUS ANTICOAGULANT REFLEX PANEL
Neutralized PTT LA Ratio: 1.15 (ref <=1.20)
PTT-LA: 2.06 — ABNORMAL HIGH (ref <=1.20)
Protime: 12.7 s (ref 12.0–15.5)
Thrombin Time: 35.5 s — ABNORMAL HIGH (ref <=19.5)
dRVVT Screen: 0.74 (ref <=1.20)

## 2022-10-05 LAB — BETA-2 GLYCOPROTEIN ANTIBODIES
Beta-2 Glyco 1 IgG: <10 SGU (ref <=20)
Beta-2 Glyco 1 IgM: <10 SMU (ref <=20)

## 2022-10-05 LAB — CARDIOLIPIN ANTIBODIES IGG & IGM
Cardiolipin Ab IgM: <10 [MPL'U] (ref <=12)
Cardiolipin Antibody, IgG: <10 [GPL'U] (ref <=14)

## 2022-10-05 NOTE — Progress Notes (Signed)
Pt's wife in office to pick up samples of Eliquis due to not having any insurance at this time. Discussed with Dr Ernestina Columbia who stated that pt needs Eliquis '10mg'$  BID x5 additional day, due to receiving 2 days in the hospital, and then '5mg'$  twice a day. Samples given by [facility] and signer. Signer explained to wife how to take Eliquis and gave written instructions with samples.

## 2022-10-05 NOTE — Telephone Encounter (Signed)
Notified of appt for CTA Chest on 4/9 and follow up with Amy on 4/11. She is aware of all appt dates, times, locations and prep. I will mail appts to him.    She stated that they were given a  coupon for Eliquis when Larwrence was discharged from the hospital that expired in 2013. I have referred them to [facility] and he will take care of getting them the medicine.

## 2022-10-21 ENCOUNTER — Ambulatory Visit: Admit: 2022-10-21 | Discharge: 2022-10-21 | Payer: MEDICAID | Attending: Family Medicine | Primary: Family Medicine

## 2022-10-21 DIAGNOSIS — Z09 Encounter for follow-up examination after completed treatment for conditions other than malignant neoplasm: Secondary | ICD-10-CM

## 2022-10-21 MED ORDER — ALBUTEROL SULFATE HFA 108 (90 BASE) MCG/ACT IN AERS
10890 (90 Base) MCG/ACT | Freq: Four times a day (QID) | RESPIRATORY_TRACT | 3 refills | Status: AC | PRN
Start: 2022-10-21 — End: 2023-02-01

## 2022-10-21 MED ORDER — FAMOTIDINE 20 MG PO TABS
20 MG | ORAL_TABLET | Freq: Every day | ORAL | 1 refills | Status: AC
Start: 2022-10-21 — End: 2023-02-01

## 2022-10-21 MED ORDER — APIXABAN 5 MG PO TABS
5 MG | ORAL_TABLET | Freq: Two times a day (BID) | ORAL | 0 refills | Status: AC
Start: 2022-10-21 — End: 2022-11-05

## 2022-10-21 MED ORDER — MONTELUKAST SODIUM 10 MG PO TABS
10 MG | ORAL_TABLET | Freq: Every day | ORAL | 1 refills | Status: AC
Start: 2022-10-21 — End: 2023-02-01

## 2022-10-21 NOTE — Progress Notes (Signed)
Danny Ruiz (DOB:  Dec 29, 1970) is a 52 y.o. male,New patient, here for evaluation of the following chief complaint(s):  New Patient (Establish care.), Follow-Up from Hospital (Was in the hospital for a pulmonary embolism. Still has some SOB and feels like his energy is very low. ), Back Pain (Has a knot in the back left side that will feel tight then loosens. Thinks it may be a hernia. 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 Cannot lift his arms above his head.), and Ankle Pain (Left ankle injury while playing basketball 2 years ago. Has been bothering him recently and gets better while moving, but worsens after sitting.)         ASSESSMENT/PLAN:  1. Hospital discharge follow-up  2. Swelling  -     US ABDOMEN LIMITED; Future  3. Shoulder impingement  -     [facility] Therapy  4. Encounter for screening colonoscopy  5. Screening for colon cancer  -     Fecal DNA Colorectal cancer screening (Cologuard)  6. Encounter to establish care  7. Achilles tendinitis of left lower extremity      Patient encouraged to maintain follow-ups as scheduled.  Patient was instructed he should remain on Eliquis and take this as prescribed.  Discussed with patient if he ever becomes acutely short of breath he should immediately go to the nearest emergency department.  There does not appear to be any acute abnormality in the ankle however my suspicion is that his intermittent pain is likely in the setting of Achilles tendinitis.  Advised patient on Achilles stretches and I think that if he does these regularly this will help reduce the recurrence of his pain.  Shoulder pain is consistent with impingement.  Will refer patient to physical therapy for evaluation and treatment of this.  Patient was encouraged to regularly do the exercises that they prescribed as the more frequent the these are completed the more successful  treatment usually is.  We did not complete x-ray as I have low suspicion for any joint abnormality though if this persists we may need to complete this at the next appointment especially if PT is unsuccessful  I do not think that patient's posterior flank pain is related to any sort of hernia however given that this has persisted for such a long time and patient is still concerned about it despite several evaluations I do think it is reasonable to go ahead and proceed with an ultrasound.  This has been ordered at this time.  I still suspect that it is likely musculoskeletal        Return in about 3 months (around 01/21/2023).         Subjective   SUBJECTIVE/OBJECTIVE:  Danny Ruiz is a 52 y.o. male who presents for hospital follow-up and to establish care.  Patient has multiple concerns today.  Patient was discharged last week after multiple bilateral pulmonary emboli.  Patient says he was having difficulty breathing which prompted him to go to the emergency department.  Patient was hospitalized for 3 days and says that his symptoms are doing much better at this point.  Patient was discharged on Eliquis and has been taking this daily.  Patient also has a DVT in the right leg which is being monitored.  Patient has a follow-up scheduled with heme-onc in a few weeks.  Patient denies any chest pain at this time.  Patient has a repeat chest CT scheduled as well in the near future.  Patient says he has been experiencing some pain in the left ankle in the posterior region.  Patient says that this has been ongoing intermittently for the last 2 years.  Patient says he suddenly heard something pop when he was playing basketball and this has been occurring since that time.  Patient says he has pain with inactivity that then improves when he gets becomes more active.  Patient also notes limited range of motion in the shoulders bilaterally.  Patient says he has difficulty with overhead movements.  This is particularly true  on the left side.  Patient has difficulty with internal rotation as well.  Patient says this has been occurring for around 2 to 3 months and seems to be progressive.  Patient denies any numbness, tingling, weakness or dropping any objects.  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  Patient has been taking Tylenol which she says is mildly beneficial.  Patient also has a knot at the site of where he had a previous nephrectomy.  Patient says there was no incision at this point however it does seem to be somewhat swollen and he is concerned about this.  Patient says that this area has been bothering him for years and he has been seen for it before and had been told that it was likely muscular.  Patient has tried multiple muscle relaxers without much success and would like to do further investigation to be sure it is not some sort of hernia or something otherwise.  Patient has never had a colonoscopy but is agreeable to Cologuard                Back Pain  Pertinent negatives include no abdominal pain, chest pain, fever, headaches or weakness.   Shoulder Pain   Pertinent negatives include no fever.   Ankle Pain         Review of Systems   Constitutional:  Negative for activity change and fever.   HENT:  Negative for congestion.    Eyes:  Negative for visual disturbance.   Respiratory:  Positive for cough and shortness of breath. Negative for chest tightness and wheezing.    Cardiovascular:  Negative for chest pain.   Gastrointestinal:  Negative for abdominal pain and blood in stool.   Genitourinary:  Negative for difficulty urinating and urgency.   Musculoskeletal:  Positive for arthralgias, back pain and myalgias.   Neurological:  Negative for dizziness, weakness and headaches.   Psychiatric/Behavioral:  Negative for confusion.           Objective   Physical Exam  Vitals reviewed.   Constitutional:       General: He is not in acute distress.     Appearance: Normal appearance. He is not  ill-appearing.   HENT:      Head: Normocephalic and atraumatic.   Cardiovascular:      Rate and Rhythm: Normal rate and regular rhythm.      Pulses: Normal pulses.      Heart sounds: Normal heart sounds. No murmur heard.  Pulmonary:      Effort: Pulmonary effort is normal. No respiratory distress.      Breath sounds: Normal breath sounds. No wheezing or rhonchi.   Chest:      Chest wall: No tenderness.   Abdominal:      General: Abdomen is flat. Bowel sounds are normal. There  is no distension.      Palpations: Abdomen is soft.      Tenderness: There is no abdominal tenderness.   Musculoskeletal:         General: No swelling.      Comments: No tenderness with palpation over the medial or lateral malleolus of the left ankle, no tenderness with palpation over the Achilles tendon  Somewhat restricted movement in the shoulders bilaterally with more restriction on the left side, severe pain with Michel Bickers test, mild pain with speeds and empty can  No visible swelling on the left flank though there is a slight density palpable, no incision scars noted   Skin:     General: Skin is warm and dry.   Neurological:      General: No focal deficit present.      Mental Status: He is alert.            Vitals:    10/21/22 1456   BP: 124/72   Pulse: 94   Resp: 16   Temp: 98.3 F (36.8 C)   SpO2: 94%        Current Outpatient Medications   Medication Sig Dispense Refill    apixaban (ELIQUIS) 5 MG TABS tablet Take 1 tablet by mouth 2 times daily 60 tablet 0    famotidine (PEPCID) 20 MG tablet Take 1 tablet by mouth Daily with supper 90 tablet 1    albuterol sulfate HFA (VENTOLIN HFA) 108 (90 Base) MCG/ACT inhaler Inhale 2 puffs into the lungs every 6 hours as needed for Wheezing 18 g 3    montelukast (SINGULAIR) 10 MG tablet Take 1 tablet by mouth daily 90 tablet 1     No current facility-administered medications for this visit.      Family History   Problem Relation Age of Onset    No Known Problems Mother     No Known Problems  Father     No Known Problems Brother     No Known Problems Half-Sister     No Known Problems Half-Sister     No Known Problems Daughter     No Known Problems Daughter     No Known Problems Son       Past Medical History:   Diagnosis Date    DVT (deep venous thrombosis) (HCC)     had on two occasions in the past    GERD (gastroesophageal reflux disease)     Pulmonary embolism ([place]) 10/01/2022    Tobacco abuse       Past Surgical History:   Procedure Laterality Date    KIDNEY REMOVAL Left     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"      Allergies   Allergen Reactions    Nsaids         Lab Results   Component Value Date    NA 139 10/04/2022    K 4.2 10/04/2022    CL 104 10/04/2022    CO2 23 10/04/2022    BUN 13 10/04/2022    CREATININE 1.1 10/04/2022    GLUCOSE 119 (H) 10/04/2022    CALCIUM 8.5 (L) 10/04/2022    PROT 7.0 10/01/2022    LABALBU 4.0 10/01/2022    BILITOT 0.5 10/01/2022    ALKPHOS 98 10/01/2022    AST 28 10/01/2022    ALT 38 10/01/2022    LABGLOM >60 10/04/2022        Lab Results   Component Value Date  WBC 9.3 10/04/2022    HGB 13.4 (L) 10/04/2022    HCT 41.1 (L) 10/04/2022    MCV 90.9 10/04/2022    PLT 280 10/04/2022                EMR Dragon/transcription disclaimer:  Much of this encounter note is electronic transcription/translation of spoken language toprinted texts.  The electronic translation of spoken language may be erroneous, or at times, nonsensical words or phrases may be inadvertently transcribed.  Although I have reviewed the note for such errors, some may stillexist.      An electronic signature was used to authenticate this note.    --Leonor Liv, MD

## 2022-10-26 ENCOUNTER — Inpatient Hospital Stay: Admit: 2022-10-26 | Payer: MEDICAID | Primary: Family Medicine

## 2022-10-26 DIAGNOSIS — R609 Edema, unspecified: Secondary | ICD-10-CM

## 2022-10-29 ENCOUNTER — Other Ambulatory Visit: Payer: Self-pay | Admitting: Cardiology

## 2022-11-02 ENCOUNTER — Encounter: Payer: MEDICAID | Attending: Family Medicine | Primary: Family Medicine

## 2022-11-02 ENCOUNTER — Encounter: Payer: Self-pay | Admitting: Cardiology

## 2022-11-02 DIAGNOSIS — I25118 Atherosclerotic heart disease of native coronary artery with other forms of angina pectoris: Secondary | ICD-10-CM

## 2022-11-02 NOTE — Telephone Encounter (Signed)
From patient.

## 2022-11-02 NOTE — Telephone Encounter (Signed)
ICD-10-CM   1. Coronary artery disease of native artery of native heart with stable angina pectoris  I25.118 PCV CARDIAC STRESS TEST      Orders Placed This Encounter  Procedures   PCV CARDIAC STRESS TEST    Standing Status:   Future    Standing Expiration Date:   01/02/2023

## 2022-11-03 ENCOUNTER — Encounter

## 2022-11-03 ENCOUNTER — Inpatient Hospital Stay: Admit: 2022-11-03 | Payer: MEDICAID | Attending: Internal Medicine | Primary: Family Medicine

## 2022-11-03 DIAGNOSIS — I2699 Other pulmonary embolism without acute cor pulmonale: Secondary | ICD-10-CM

## 2022-11-03 MED ORDER — IOPAMIDOL 76 % IV SOLN
76 | Freq: Once | INTRAVENOUS | Status: AC | PRN
Start: 2022-11-03 — End: 2022-11-03
  Administered 2022-11-03: 14:00:00 70 mL via INTRAVENOUS

## 2022-11-03 NOTE — Telephone Encounter (Signed)
Patient's wife confirmed his appt on 11/05/22

## 2022-11-04 ENCOUNTER — Encounter: Payer: MEDICAID | Attending: Family Medicine | Primary: Family Medicine

## 2022-11-04 ENCOUNTER — Inpatient Hospital Stay: Admit: 2022-11-04 | Discharge: 2022-11-04 | Payer: MEDICAID | Primary: Family Medicine

## 2022-11-04 DIAGNOSIS — M25819 Other specified joint disorders, unspecified shoulder: Secondary | ICD-10-CM

## 2022-11-04 NOTE — Progress Notes (Signed)
Physical Therapy: Initial Evaluation    Patient: Danny Ruiz (52 y.o. male)   Examination Date: 11/04/2022  Plan of Care Certification Period:11/04/2022 to        DOB:  Oct 19, 1970 ;   DOB Confirmed: Yes MRN: [id number]  CSN: [id number]   Insurance: Payor: Rolene Arbour MEDICAID / Plan: [place] / Product Type: *No Product type* /   Insurance ID: [id number] - (Medicaid Managed) Secondary Insurance (if applicable):    Referring Physician: Eliezer Bottom, MD Eliezer Bottom, MD   PCP: Eliezer Bottom, MD Visits to Date/Visits Approved: 1 /  (12 visits anticipated for this diagnosis)    No Show/Cancelled Appts:   /       Medical Diagnosis: Other specified joint disorders, unspecified shoulder [M25.819] shoulder impingement  Treatment Diagnosis:       PERTINENT MEDICAL HISTORY   Patient Assessed for Rehabilitation Services: Yes       Medical History: Chart Reviewed: Yes    Past Medical History:   Diagnosis Date    DVT (deep venous thrombosis) (HCC)     had on two occasions in the past    GERD (gastroesophageal reflux disease)     Pulmonary embolism (HCC) 10/01/2022    Tobacco abuse      Surgical History:   Past Surgical History:   Procedure Laterality Date    KIDNEY REMOVAL Left     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"       Medications:   Current Outpatient Medications:     apixaban (ELIQUIS) 5 MG TABS tablet, Take 1 tablet by mouth 2 times daily, Disp: 60 tablet, Rfl: 0    famotidine (PEPCID) 20 MG tablet, Take 1 tablet by mouth Daily with supper, Disp: 90 tablet, Rfl: 1    albuterol sulfate HFA (VENTOLIN HFA) 108 (90 Base) MCG/ACT inhaler, Inhale 2 puffs into the lungs every 6 hours as needed for Wheezing, Disp: 18 g, Rfl: 3    montelukast (SINGULAIR) 10 MG tablet, Take 1 tablet by mouth daily, Disp: 90 tablet, Rfl: 1  Allergies: Nsaids       SUBJECTIVE EXAMINATION      ,           Subjective History:   Patient states he fell onto concrete on left shoulder several months ago, had pain the next morning and  about month later his right shoulder started hurting. He has no pain at rest, trying to use his arms provokes pain, can't raise arms very high or reach behind his back. His pain and limitations appear confined to his shoulders only.  Abduction of arms in particular give him pain. Putting on deodorant and dressing is difficult. He is right hand dominant.    Additional Pertinent Hx (if applicable): 52 year old male referred to PT with diagnosis of shoulder impingement.             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)  Leisure & Hobbies: fish, riding motorcycle    Prior Level of Function:    Independent        Current Level of Function:      Grooming: Required Assist/Some Functional Limitations, Independent/No Functional Limitation  Bathing: Independent/No Functional Limitation, Required Assist/Some Functional Limitations  Upper Body Dressing: Independent/No Functional Limitation, Required Assist/Some Functional Limitations  Lower Body Dressing: Independent/No Functional Limitation, Required Assist/Some Functional Limitations  Don/doff Socks/Shoes: Independent/No Functional Limitation  Toileting: Independent/No Functional Limitation, Required Assist/Some Functional Limitations  Bed Mobility: Independent/No Functional Limitation  Transfers (sit to stand): Independent/No Functional Limitation  Transfers (stand to sit): Independent/No Functional  Limitation  Walking: Independent/No Functional Limitation  ADLs: Independent/No Functional Limitation, Required Assist/Some Functional Limitations  IADLs: Independent/No Functional Limitation, Required Assist/Some Functional Limitations  Driving: Independent/No Functional Limitation  Work Related Tasks: Independent/No Functional Limitation, Required Assist/Some Functional Limitations  ADL Assistance: Independent  Homemaking Assistance: Independent  Active Driver: Yes  Mode of Transportation: Set designer, Truck    OBJECTIVE EXAMINATION   Restrictions:              Review of Systems:  Vision: Within Functional Limits  Hearing: Within functional limits  Overall Orientation Status: Within Normal Limits  Patient affect:: Normal  Follows Commands: Within Functional Limits    Observations:  General Observations  Description: Patient presents to PT eval room not appearing in acute distress, not having pain with arms at rest. He stands with some rounding of shoulders, elevated left shoulder, slight curve of upper body to right. There is increased tenderness to palpation bilaterally at the biceps tendon origin, bilateral supraspinatus tendons, bilateral coracoid processes. There is increased elevation of left shoulder with attempts to perform flexion and abduction motions.    Mobility:   Bed mobility  Rolling to Left: Independent  Rolling to Right: Independent  Supine to Sit: Independent  Sit to Supine: Independent        ROM:   Left AROM  Right AROM                    Left PROM  Right PROM         PROM LUE (degrees)  L Shoulder Flex  (0-180): 110 ddeg  L Shoulder ABduction (0-180): 85 deg  L Shoulder Int Rotation  (0-70): 50 deg  L Shoulder Ext Rotation  (0-90): 40 deg    PROM RUE (degrees)  R Shoulder Flex  (0-180): 180 deg  R Shoulder ABduction (0-180): 95 degrees  R Shoulder Int Rotation  (0-70): 55 deg  R Shoulder Ext Rotation  (0-90): 48 deg     Left Strength  Right Strength         Strength LUE  L Shoulder Flexion: 4/5  L  Shoulder ABduction: 4-/5  L Shoulder Internal Rotation: 5/5  L Shoulder External Rotation: 4-/5  L Elbow Flexion: 5/5  L Elbow Extension: 5/5  L Wrist Flexion: 5/5  L Wrist Extension: 5/5    Strength RUE  R Shoulder Flexion: 4+/5  R Shoulder ABduction: 4+/5  R Shoulder Internal Rotation: 5/5  R Shoulder External Rotation: 4+/5  R Elbow Flexion: 5/5  R Elbow Extension: 5/5  R Wrist Flexion: 5/5  R Wrist Extension: 5/5     Additional Spinal Strength (if applicable):      Additional Finding(s) (if applicable): Additional Measures  Special Tests: Positive downturned can test, + Hawkins/Kennedy  Impingement test (worse on left shoulder).  Other: Patient scored 50% impairment on the general use portion of the QuickDASH survey.    ASSESSMENT     Impression:Assessment: The following problems requiring PT assistance were identified at today's PT initial evaluation: 1) bilateral shoulder pain (apparent impingement, left worse than right, shoulder tightness in capsular pattern), 2) decreased right shoulder active and passive ROM, 3) decreased bilateral shoulder strength, left worse than right, 4) decreased ability to dress and perform other ADL's due to bilateral shoulder limitations, 5) decreased ability to perform work due to shoulder restrictions, 6) need for trial use of home pulleys to improve shoulder ROM, 7) need for instruction in HEP.    Body Structures, Functions, Activity Limitations Requiring Skilled Therapeutic Intervention: Decreased ADL status, Decreased ROM, Decreased tolerance to work activity, Decreased strength, Decreased high-level IADLs, Increased pain, Decreased posture    Statement of Medical Necessity: Physical Therapy is both indicated and medically necessary as outlined in the POC to increase the likelihood of meeting the functionally related goals stated below.     Patient's Activity Tolerance: Patient limited by pain, Patient tolerated evaluation without incident        Patient's rehabilitation  potential/prognosis is considered to be: Good, Fair    Factors which may impact rehabilitation potential include:          GOALS     Patient Goal(s): Decreased pain in shoulders, increased strength in arms and be able to perform ADL's and IADL's easier.  Short Term Goals Completed by 3-4 weeks Goal Status   Patient to be independent with HEP.     Patient to be independent with use of home pulleys to improve shoulder ROM.     Patient to report greater ease getting dressed, putting on belt, performing toiletries.                                                   Long Term Goals Completed by 4-6 weeks Goal Status   Patient to have >= 165 degrees bilateral shoulder passive ROM, >= 80 degrees bilateral ER, >= 65 degrees bilateral IR.     Patient to have >= 5-/5 right shoulder flexion, abduction and ER; >= 4+/5 left shoulder flexion, abduction and ER.     Patient to score <= 25% impairment on the general use portion of the QuickDASH.                                                    TREATMENT PLAN   PT Equipment Recommendations  Other: Issued home pulleys on 11/04/22   Requires PT Follow-Up: Yes    Pt. actively involved in establishing Plan of Care and Goals: Yes  Patient/ Caregiver education and instruction:PT Role, Plan of Care, Evaluative findings, Orientation, Equipment Issued overhead pulleys at initial evaluation and instructed on use.           Treatment may include any combination of the following: Current Treatment Recommendations: Strengthening, ROM, Pain management, Modalities, Patient/Caregiver education & training, Home exercise program, Manual     Frequency / Duration:  Patient to be seen 2 x per week for 4-6 weeks weeks       Eval Complexity:  Decision Making: Medium Complexity    PT Treatment Completed:  N/A - Evaluation Only    Therapy Time  Individual Time In: 1506       Individual Time Out: 1550  Minutes: 44  Timed Code Treatment Minutes: 44 Minutes     Therapist Signature: Trisha Mangle, PT    Date:  9/62/9528     I certify that the above Therapy Services are being furnished while the patient is under my care. I agree with the treatment plan and certify that this therapy is necessary.      Physician's Signature:  ___________________________   Date:_______                                                                   Eliezer Bottom, MD        Physician Comments: _______________________________________________    Please sign and return to [facility].  Please fax to the location listed below. THANK YOU for this referral!    [facility]  [facility]  [address]  [address]  [facility] [place] [address]  Dept: [phone]  Dept Fax: [phone]  Loc: [phone]       POC NOTE

## 2022-11-05 ENCOUNTER — Ambulatory Visit: Admit: 2022-11-05 | Discharge: 2022-11-05 | Payer: MEDICAID | Attending: Nurse Practitioner | Primary: Family Medicine

## 2022-11-05 ENCOUNTER — Inpatient Hospital Stay: Admit: 2022-11-05 | Discharge: 2022-11-05 | Payer: MEDICAID | Primary: Family Medicine

## 2022-11-05 ENCOUNTER — Encounter

## 2022-11-05 DIAGNOSIS — I2693 Single subsegmental pulmonary embolism without acute cor pulmonale: Secondary | ICD-10-CM

## 2022-11-05 DIAGNOSIS — Z79899 Other long term (current) drug therapy: Secondary | ICD-10-CM

## 2022-11-05 DIAGNOSIS — I2694 Multiple subsegmental pulmonary emboli without acute cor pulmonale: Secondary | ICD-10-CM

## 2022-11-05 LAB — COMPREHENSIVE METABOLIC PANEL
ALT: 26 U/L (ref 21–72)
AST: 23 U/L (ref 17–59)
Albumin: 4.3 g/dL (ref 3.5–5.2)
Alkaline Phosphatase: 71 U/L (ref 40–130)
Anion Gap: 15 mmol/L (ref 7–19)
BUN: 12 mg/dL (ref 9–20)
CO2: 21 mmol/L — ABNORMAL LOW (ref 22–29)
Calcium: 8.7 mg/dL (ref 8.4–10.2)
Chloride: 105 mmol/L (ref 98–111)
Creatinine: 1.1 mg/dL (ref 0.6–1.2)
Est, Glom Filt Rate: 80 (ref >60)
Globulin: 2.7 g/dL
Glucose: 161 mg/dL — ABNORMAL HIGH (ref 74–106)
Potassium: 3.9 mmol/L (ref 3.5–5.1)
Sodium: 141 mmol/L (ref 137–145)
Total Bilirubin: 0.4 mg/dL (ref 0.2–1.3)
Total Protein: 7 g/dL (ref 6.3–8.2)

## 2022-11-05 LAB — CBC WITH AUTO DIFFERENTIAL
Basophils %: 0.9 % (ref 0.1–1.2)
Basophils Absolute: 0.06 10*3/uL (ref 0.01–0.08)
Eosinophils %: 5.4 % (ref 0.7–7.0)
Eosinophils Absolute: 0.36 10*3/uL (ref 0.04–0.54)
Hematocrit: 46 % (ref 40.1–51.0)
Hemoglobin: 15 g/dL (ref 13.7–17.5)
Lymphocytes %: 27.5 % (ref 19.3–53.1)
Lymphocytes Absolute: 1.83 10*3/uL (ref 1.18–3.74)
MCH: 29.5 pg (ref 25.7–32.2)
MCHC: 32.6 g/dL (ref 32.3–36.5)
MCV: 90.6 fL (ref 79.0–92.2)
MPV: 9.2 fL (ref 7.4–10.4)
Monocytes %: 6.5 % (ref 4.7–12.5)
Monocytes Absolute: 0.43 10*3/uL (ref 0.24–0.82)
Neutrophils %: 59.4 % (ref 34.0–71.1)
Neutrophils Absolute: 3.96 10*3/uL (ref 1.56–6.13)
Platelets: 277 10*3/uL (ref 163–337)
RBC: 5.08 M/uL (ref 4.63–6.08)
RDW: 13.2 % (ref 11.6–14.4)
WBC: 6.66 10*3/uL (ref 4.23–9.07)

## 2022-11-05 LAB — ANTITHROMBIN 3 ACTIVITY: AT-III Activity: 97 % Activity (ref 83–121)

## 2022-11-05 MED ORDER — APIXABAN 5 MG PO TABS
5 | ORAL_TABLET | Freq: Two times a day (BID) | ORAL | 5 refills | Status: DC
Start: 2022-11-05 — End: 2023-02-01

## 2022-11-05 NOTE — Telephone Encounter (Signed)
Notified of CTA Pulomary Scan scheduled for 7/3 at 9:30 and appt with Amy on 7/22 at 9:45. Nothing to eat or drink 4 hours prior to scan. She is aware of all dates, times, and prep.

## 2022-11-05 NOTE — Progress Notes (Signed)
Progress Note      Pt Name: Danny Ruiz  Birthdate: 1970-10-12  MRN: [id number]    Date of evaluation: 11/05/2022  History Obtained From:  patient, electronic medical record    CHIEF COMPLAINT:    Chief Complaint   Patient presents with    Follow-up     [facility] follow-up    Anticoagulation     Unprovoked bilateral pulmonary emboli with right lower extremity DVT     HISTORY OF PRESENT ILLNESS:    Danny Ruiz is a 52 y.o. Caucasian male who is currently being followed for unprovoked bilateral pulmonary emboli with DVT in the right lower extremity (gastrocnemius vein), he has history of DVT on 2 occasions that were provoked.  He returns today in hospital follow-up to complete thrombophilia workup.  Current recommendation is for long-term anticoagulation with Eliquis 5 mg p.o. twice daily.  Danny Ruiz reports that he has been compliant with the Eliquis 5 mg twice daily and his shortness of air has improved but he still experiences with exertion.  He reports persistently having right lower extremity edema that improves with rest.    Today's clinic visit to include physical assessment, review of systems, any lab or radiographic findings that were available and plan of care are documented below.    HEMATOLOGIC HISTORY:     Diagnosis  Unprovoked bilateral pulmonary emboli with DVT in the right lower extremity (gastrocnemius vein), March 2024: PESI score: 102 points. Class III, Intermediate Risk: 3.2-7.1% 30-day mortality in this group.  History of provoked PE x 2 treated with Lovenox first episode and Eliquis second episode x 3 months    Treatment summary:  10/01/2022-patient was started on IV heparin and transitioned to Eliquis at discharge.  Recommendation is for lifelong anticoagulation with Eliquis 5 mg p.o. twice daily    Danny Ruiz was seen by Dr Ronda Fairly on 10/02/2022 for DVT/PE after presenting to the ER department with complaints of shortness of breath for a few weeks.  The patient denies any  recent trauma, surgery, prolonged immobilization.  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  He received Lovenox for his first episode and Eliquis x 3 months for his second episode.  The patient does not have a primary care.  He has not had any age-appropriate cancer screening.  The patient is a daily smoker since age 27.  The patient denies any weight loss.  Denies any hematuria, melena, hematochezia or hematemesis.    10/01/2022-CT pulmonary protocol remarkable for-Multiple filling defects in the pulmonary arteries consistent with thromboemboli in the distal right main pulmonary artery multiple segmental and subsegmental branches of the right upper and middle lobes in the left lower lobe. - Right ventricle/Left ventricle ratio (normal <0.9): Normal. - Main pulmonary artery: Normal caliber.  Lung Parenchyma, Pleura, and Airways: Mild thickening of the peripheral airways and scattered mucus plugging. No dense airspace consolidation. No suspicious nodule/mass. Dependent atelectasis in the lower lobes.  No pleural effusion or pneumothorax. 1 cm  subpleural nodule at the left lower lobe base on axial image 80, coronal image 83.  Thoracic Inlet, Mediastinum, and Hila: Thyroid gland is unremarkable. No lymphadenopathy.  Heart, Vessels, and Pericardium: Normal caliber aorta. No visible coronary artery calcifications. No cariomegaly. No pericardial effusion.  Bones and Soft Tissues: No acute osseous abnormality. No mass or adenopathy.  Upper Abdomen: 2 cm cyst noted in the right lobe of the liver.  Post left nephrectomy.  Impression:  1 cm subpleural nodule in the left lower lobe could be post infectious/inflammatory or neoplastic.      09/30/21- 2D echo-normal LVEF 55-60%. No regional wall motion. Mild concentric left ventricular hypertrophy. Normal  diastolic function. Normal right ventricular size with preserved RV function (TAPSE 23 mm).  Normal bi-atrial size.  No clinically  significant valvular stenosis or aortic regurgitation. Aortic root and ascending aorta dimensions are within normal limits.  The IVC is normal. The rhythm is sinus.    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  No additional DVT visualized.  No evidence of SVT or reflux noted at this time.    10/01/2022-patient was started on IV heparin.     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    10/27/2022 CTA pulmonary- reported complete or near complete resolution of previously seen pulmonary emboli bilaterally. Question only trace residual right lower lobe pulmonary embolus at a branch point versus artifact. No acute abnormality in the thorax. Scattered bilateral pulmonary nodularity is again noted without significant change measuring up to 5 mm.   Previously seen 1 cm nodular opacity adjacent the left hemidiaphragm in the left lower lobe is no longer visualized and likely represents resolution of an inflammatory process.  Overall improving bronchial wall thickening to the left lower lobe in comparison to prior.     Age-appropriate health screening:  Has had no routine health screening  Has Cologuard test kit, encouraged to complete    Past Medical History:    Past Medical History:   Diagnosis Date    Asthma     DVT (deep venous thrombosis) (HCC)     had on two occasions in the past    GERD (gastroesophageal reflux disease)     Pulmonary embolism (HCC) 10/01/2022    Tobacco abuse        Past Surgical History:    Past Surgical History:   Procedure Laterality Date    KIDNEY REMOVAL Left     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"       Current Medications:    Current Outpatient Medications   Medication Sig Dispense Refill    Glucosamine-Chondroitin (GLUCOSAMINE CHONDR  COMPLEX PO) Take by mouth in the morning and at bedtime      acetaminophen (TYLENOL) 325 MG tablet Take 2 tablets by mouth every 6 hours as needed for Pain      apixaban (ELIQUIS) 5 MG TABS tablet Take 1 tablet by mouth 2 times daily 60 tablet 5    famotidine (PEPCID) 20 MG tablet Take 1 tablet by mouth Daily with supper 90 tablet 1    albuterol sulfate HFA (VENTOLIN HFA) 108 (90 Base) MCG/ACT inhaler Inhale 2 puffs into the lungs every 6 hours as needed for Wheezing 18 g 3    montelukast (SINGULAIR) 10 MG tablet Take 1 tablet by mouth daily 90 tablet 1     No current facility-administered medications for this visit.        Allergies:   Allergies   Allergen Reactions    Nsaids        Social History:    Social History     Tobacco Use    Smoking status: Every Day     Current packs/day: 1.00  Average packs/day: 1 pack/day for 15.0 years (15.0 ttl pk-yrs)     Types: Cigarettes    Smokeless tobacco: Never    Tobacco comments:     Started at age 73, has had a PPD on avrg since he began, smoked 40 pack-years   Vaping Use    Vaping Use: Never used   Substance Use Topics    Alcohol use: Not Currently    Drug use: Not Currently     Comment: tried MJ a few times in the past       Family History:   Family History   Problem Relation Age of Onset    No Known Problems Mother     No Known Problems Father     No Known Problems Brother     No Known Problems Half-Sister     No Known Problems Half-Sister     No Known Problems Daughter     No Known Problems Daughter     No Known Problems Son        Vitals:  Vitals:    11/05/22 0821   BP: 118/78   Pulse: 76   Temp: 98.7 F (37.1 C)   SpO2: 97%   Weight: 95.2 kg (209 lb 12.8 oz)   Height: 1.854 m (6\' 1" )        Subjective   REVIEW OF SYSTEMS:   Review of Systems   Constitutional: Negative.  Negative for chills, diaphoresis and fever.   HENT: Negative.  Negative for congestion, ear pain, hearing loss, nosebleeds, sore throat and tinnitus.    Eyes: Negative.  Negative for pain,  discharge and redness.   Respiratory:  Positive for shortness of breath (worse with exertion). Negative for cough and wheezing.    Cardiovascular:  Positive for leg swelling (worse at end of day). Negative for chest pain and palpitations.   Gastrointestinal: Negative.  Negative for abdominal pain, blood in stool, constipation, diarrhea, nausea and vomiting.   Endocrine: Negative for polydipsia.   Genitourinary:  Negative for dysuria, flank pain, frequency, hematuria and urgency.   Musculoskeletal:  Positive for back pain. Negative for myalgias and neck pain.   Skin: Negative.  Negative for rash.   Neurological: Negative.  Negative for dizziness, tremors, seizures, weakness and headaches.   Hematological:  Does not bruise/bleed easily.   Psychiatric/Behavioral: Negative.  The patient is not nervous/anxious.        Objective   PHYSICAL EXAM:  Physical Exam  Vitals reviewed.   Constitutional:       General: He is not in acute distress.     Appearance: He is well-developed.   HENT:      Head: Normocephalic and atraumatic.      Mouth/Throat:      Pharynx: Uvula midline.      Tonsils: No tonsillar exudate.   Eyes:      General: Lids are normal.      Conjunctiva/sclera: Conjunctivae normal.      Pupils: Pupils are equal, round, and reactive to light.   Neck:      Thyroid: No thyroid mass or thyromegaly.      Vascular: No JVD.      Trachea: Trachea normal. No tracheal deviation.   Cardiovascular:      Rate and Rhythm: Normal rate and regular rhythm.      Pulses: Normal pulses.      Heart sounds: Normal heart sounds.   Pulmonary:      Effort: Pulmonary effort is normal. No respiratory  distress.      Breath sounds: Normal breath sounds. No wheezing or rales.   Chest:      Chest wall: No tenderness.   Abdominal:      General: Bowel sounds are normal. There is no distension.      Palpations: Abdomen is soft. There is no mass.      Tenderness: There is no abdominal tenderness. There is no guarding.   Musculoskeletal:          General: No tenderness or deformity.      Cervical back: Normal range of motion and neck supple.      Right lower leg: Edema present.      Comments: Range of motion within normal limits x4 extremities   Skin:     General: Skin is warm.      Findings: No bruising, erythema or rash.   Neurological:      Mental Status: He is alert and oriented to person, place, and time.      Cranial Nerves: No cranial nerve deficit.      Coordination: Coordination normal.   Psychiatric:         Behavior: Behavior normal.         Thought Content: Thought content normal.         Labs reviewed today:  Lab Results   Component Value Date    WBC 6.66 11/05/2022    HGB 15.0 11/05/2022    HCT 46.0 11/05/2022    MCV 90.6 11/05/2022    PLT 277 11/05/2022     Lab Results   Component Value Date    NEUTROABS 3.96 11/05/2022       ASSESSMENT/PLAN:      1. Unprovoked bilateral pulmonary emboli with DVT in the right lower extremity (gastrocnemius vein) March 2024, he has history of DVT on 2 occasions that were provoked.  Current recommendation is for long-term anticoagulation with Eliquis 5 mg p.o. twice daily.      PESI score: 102 points. Class III, Intermediate Risk: 3.2-7.1% 30-day mortality in this group.    Javeon reports that he has been compliant with the Eliquis 5 mg twice daily and his shortness of air has improved but he still experiences with exertion.      He reports persistently having right lower extremity edema that improves with rest.    Patient only has 1 kidney, will monitor renal function closely    Plan is to complete thrombophilia workup.    Will obtain the following serology studies for further evaluation:  - Activated Protein C Resistance; Future  - Antithrombin III; Future  - Protein S Antigen, Free; Future  - Factor 5 [place]; Future  - CBC with Auto Differential; Future  - Comprehensive Metabolic Panel; Future      -Continue Eliquis 5 mg p.o. twice daily: apixaban (ELIQUIS) 5 MG TABS tablet; Take 1 tablet by mouth 2 times  daily  Dispense: 60 tablet; Refill: 5  - CTA PULMONARY W CONTRAST; Future please schedule around 02/15/2023  -Encouraged wearing compression socks and elevating right lower extremity is much as possible.    2. Tobacco abuse: Currently smoking 1 pack/day for the last 35 years  We talked about the importance of quitting smoking for approximately 4-5 minutes. Specifically, we discussed the risk related tobacco including but not limited to carcinoma, cardiovascular disease, stroke, and financial loss. I advised to quit smoking and offered resources to include nicotine patches to help with the craving. The patient is contemplated at this  time. I will follow-up on smoking cessation during the next visit and continue to encourage quitting tobacco use.      3.  Routine health screening:  Strongly recommend completing a Cologuard if not moving forward with a referral to GI for screening colonoscopy  Upon return if has not been seen by a PCP will obtain a baseline PSA level  Will continue with annual low-dose screening lung CT scan      I discussed all of the above findings included in the assessment and plan with the patient and the patient is in agreement to move forward with current recommendations/treatment.  I have addressed all of their questions and concerns that were verbalized.         FOLLOW UP:  Follow-up appointment made for 3 months, or sooner, if needed bilateral pulmonary emboli and right lower extremity DVT  Continue to follow with other medical providers as recommended  Labs at next visit: CBC and CMP    EMR Dragon/Transcription disclaimer:   Much of this encounter note is an electronic transcription/translation of spoken language to printed text. The electronic translation of spoken language may permit erroneous, or at times, nonsensical words or phrases to be inadvertently transcribed; although attempts have made to review the note for such errors, some may still exist.  Please excuse any unrecognized  transcription errors and contact us if the error is unintelligible or needs documented correction.  Also, portions of this note have been copied forward, however, changed to reflect the most current clinical status of this patient.    Electronically signed by Daisey Must, APRN on 11/09/2022 at 10:21 AM  I, Truitt Merle, am starting this note as a registered nurse for Thomasene Ripple, APRN.

## 2022-11-06 ENCOUNTER — Inpatient Hospital Stay: Admit: 2022-11-06 | Discharge: 2022-11-06 | Payer: MEDICAID | Primary: Family Medicine

## 2022-11-06 LAB — PROTEIN C FUNCTIONAL: Protein C-Functional: 132 % (ref 83–168)

## 2022-11-06 NOTE — Progress Notes (Signed)
Daily Treatment Note  Date: 11/06/2022  Patient Name: Danny Ruiz  MRN: [id number]     DOB:   07-Apr-1971    Referring Physician: Eliezer Bottom, MD Eliezer Bottom, MD   PCP: Eliezer Bottom, MD    Medical Diagnosis: Other specified joint disorders, unspecified shoulder [M25.819] shoulder impingement  No data recorded    Insurance: Payor: [facility] MEDICAID / Plan: [facility] [place] / Product Type: *No Product type* /   Insurance ID: [id number] - (Medicaid Managed)    Subjective:   General  Diagnosis: shoulder impingement  Referring Provider (secondary): Eliezer Bottom, MD  PT Insurance Information: [facility] Medicaid Berkley Harvey after eval)  Total # of Visits Approved: 9  Total # of Visits to Date: 2  Plan of Care/Certification Expiration Date: 12/06/22  Progress Note Due Date: 12/04/22  Referring Provider (secondary): Eliezer Bottom, MD  Subjective: i'm a little sore, felt good when i left the other day but got sore later in the day from the stretching  Patient Currently in Pain: No       Treatment Activities:  Exercises:      Treatment Reasoning    Exercise 1: ++rbilateral shoulder pain, left worse than right; start conservatively, gradually progress  Exercise 2: overhead pulleys--5 mins   -----finger walk right arm--slow, progress to higher, 10 reps  4/12: x 5 ea UE  Exercise 3: left arm pendulum with 4# weight--15 reps closckwise,ccw  Exercise 4: forward bent row bilaterally, 4 pound weight 2/15 reps  Exercise 5: supine bilateral Jackins' exercise on wedge, with 2 pound weight -progress to sitting as able--2/15  Exercise 6: supine overhead cane raise, progress to using weight if appropriate--3/10 reps  Exercise 7: simulated bench press with cane, add weight as tolerated--2/15 reps  Exercise 8: shoulder protraction with 3-4# hand weights--2/15 4#  Exercise 10: corner stretch--4 reps, 10- 15 sec hold  Exercise 11: IR/ER and shoulder extension with t-band--2/15 reps red  Exercise 12: scapular retraction with band, elbows at side--  2/15 reps red band  Exercise 13: T's & Y's with t-band (red to start) 2/15  4/12:  1 x 15 due to lower back discomfort  Exercise 14: sitting rowing (like retraction, but higher)  2/15  red   4/12:  1 x 15 due to significantly limited motion  Exercise 15: chair press  x 10  Exercise 16: standing shoulder walk aways (walk back and forth with steady downward pressure)--10 reps  Exercise 17: golfers stretch with right arm (as tolerated)  5 x 10"  Exercise 18: UBE  lvl x 1                            Assessment:   Conditions Requiring Skilled Therapeutic Intervention  Body Structures, Functions, Activity Limitations Requiring Skilled Therapeutic Intervention: Decreased ADL status;Decreased ROM;Decreased tolerance to work activity;Decreased strength;Decreased high-level IADLs;Increased pain;Decreased posture  Assessment: Initiated POC per initial evaluation, guided him through his routine with verbal instructions and demonstretion for proper performance of the exercises, he is rather guarded with limited ROM making some exercises very challenging and some only performed at one set due to lower back discomfort. Will monitor his progress and advance his routine accordingly.  Requires PT Follow-Up: Yes        Goals:  Short Term Goals  Time Frame for Short Term Goals: 3-4 weeks  Short Term Goal 1: Patient to be independent with HEP.  Short Term Goal 2: Patient to  be independent with use of home pulleys to improve shoulder ROM.  Short Term Goal 3: Patient to report greater ease getting dressed, putting on belt, performing toiletries.  Long Term Goals  Time Frame for Long Term Goals : 4-6 weeks  Long Term Goal 1: Patient to have >= 165 degrees bilateral shoulder passive ROM, >= 80 degrees bilateral ER, >= 65 degrees bilateral IR.  Long Term Goal 2: Patient to have >= 5-/5 right shoulder flexion, abduction and ER; >= 4+/5 left shoulder flexion, abduction and ER.  Long Term Goal 3: Patient to score <= 25% impairment on the  general use portion of the QuickDASH.  Patient Goals   Patient Goals : Decreased pain in shoulders, increased strength in arms and be able to perform ADL's and IADL's easier.    Plan:    Physical Therapy Plan  Plan weeks: 4-6 weeks  Current Treatment Recommendations: Strengthening, ROM, Pain management, Modalities, Patient/Caregiver education & training, Home exercise program, Manual      Therapy Time:   Individual Concurrent Group Co-treatment   Time In 1100         Time Out 1158         Minutes 58            Kathyrn Drown, PTA   Electronically signed by Kathyrn Drown, PTA on 11/06/2022 at 12:02 PM

## 2022-11-07 LAB — ACTIVATED PROTEIN C RESISTANCE: Activated Protein C Resistance: 3.96 (ref >=2.00)

## 2022-11-07 LAB — PROTEIN S ANTIGEN, FREE: Protein S Ag, Free: 111 % (ref 74–147)

## 2022-11-08 LAB — PROTHROMBIN GENE MUTATION: Prothrombin Mutation: NEGATIVE

## 2022-11-09 ENCOUNTER — Ambulatory Visit: Admit: 2022-11-09 | Discharge: 2022-11-09 | Payer: MEDICAID | Attending: Family Medicine | Primary: Family Medicine

## 2022-11-09 DIAGNOSIS — D171 Benign lipomatous neoplasm of skin and subcutaneous tissue of trunk: Secondary | ICD-10-CM

## 2022-11-09 LAB — FACTOR 5 LEIDEN: Factor V Leiden: NEGATIVE

## 2022-11-09 NOTE — Progress Notes (Signed)
Danny Ruiz (DOB:  December 28, 1970) is a 52 y.o. male,Established patient, here for evaluation of the following chief complaint(s):  Follow-up (Pt here for f/u after abd ultrasound. Pt states that he would like referral to surgeon to have fatty tumor removed from his lower back as seen on ultrasound)         ASSESSMENT/PLAN:  1. Lipoma of torso  -     [facility], [place]  2. Shoulder impingement      Referral placed for [facility] general surgery for evaluation of lipoma excision.  Patient was instructed he should get a call to schedule this appointment.  Patient is to maintain follow-ups with PT as scheduled with intended follow-up afterward    Return if symptoms worsen or fail to improve.         Subjective   SUBJECTIVE/OBJECTIVE:  Danny Ruiz is a 52 y.o. male who presents for ultrasound follow-up.  Patient had a ultrasound completed on the nodule on his back.  Ultrasound results is consistent with a lipoma.  Given the tenderness and pain that this is causing patient he is interested in potentially having it surgically excised.  Patient has been doing PT and just had his first treatment on Friday.  Patient says he had a lot of soreness following this but did feel as though they worked it out thoroughly.  Patient is currently scheduled for 4 weeks of therapy though they may extend this pending his response.  No other new concerns at this time                    Review of Systems   Constitutional:  Negative for activity change, fatigue and fever.   HENT:  Negative for congestion.    Eyes:  Negative for visual disturbance.   Respiratory:  Negative for chest tightness, shortness of breath and wheezing.    Cardiovascular:  Negative for chest pain.   Gastrointestinal:  Negative for abdominal pain and blood in stool.   Genitourinary:  Negative for difficulty urinating and urgency.   Musculoskeletal:  Positive for back pain.   Neurological:  Negative for weakness and headaches.   Psychiatric/Behavioral:   Negative for confusion.           Objective   Physical Exam  Vitals reviewed.   Constitutional:       General: He is not in acute distress.     Appearance: Normal appearance. He is not ill-appearing.   HENT:      Head: Normocephalic and atraumatic.   Cardiovascular:      Rate and Rhythm: Normal rate and regular rhythm.      Pulses: Normal pulses.      Heart sounds: Normal heart sounds. No murmur heard.  Pulmonary:      Effort: Pulmonary effort is normal. No respiratory distress.      Breath sounds: Normal breath sounds. No wheezing or rhonchi.   Chest:      Chest wall: No tenderness.   Abdominal:      General: Abdomen is flat. Bowel sounds are normal. There is no distension.      Palpations: Abdomen is soft.      Tenderness: There is no abdominal tenderness.   Musculoskeletal:         General: No swelling.      Comments: Mildly visible swelling on the left flank though there is a slight density palpable, no incision scars noted   Skin:     General: Skin  is warm and dry.   Neurological:      General: No focal deficit present.      Mental Status: He is alert.            Vitals:    11/09/22 1545   BP: 122/82   Pulse: 83   Resp: 18   Temp: 98.5 F (36.9 C)   SpO2: 96%        Current Outpatient Medications   Medication Sig Dispense Refill    Glucosamine-Chondroitin (GLUCOSAMINE CHONDR COMPLEX PO) Take by mouth in the morning and at bedtime      acetaminophen (TYLENOL) 325 MG tablet Take 2 tablets by mouth every 6 hours as needed for Pain      apixaban (ELIQUIS) 5 MG TABS tablet Take 1 tablet by mouth 2 times daily 60 tablet 5    famotidine (PEPCID) 20 MG tablet Take 1 tablet by mouth Daily with supper 90 tablet 1    albuterol sulfate HFA (VENTOLIN HFA) 108 (90 Base) MCG/ACT inhaler Inhale 2 puffs into the lungs every 6 hours as needed for Wheezing 18 g 3    montelukast (SINGULAIR) 10 MG tablet Take 1 tablet by mouth daily 90 tablet 1     No current facility-administered medications for this visit.      Family History    Problem Relation Age of Onset    No Known Problems Mother     No Known Problems Father     No Known Problems Brother     No Known Problems Half-Sister     No Known Problems Half-Sister     No Known Problems Daughter     No Known Problems Daughter     No Known Problems Son       Past Medical History:   Diagnosis Date    Asthma     DVT (deep venous thrombosis) (HCC)     had on two occasions in the past    GERD (gastroesophageal reflux disease)     Pulmonary embolism (HCC) 10/01/2022    Tobacco abuse       Past Surgical History:   Procedure Laterality Date    KIDNEY REMOVAL Left     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"      Allergies   Allergen Reactions    Nsaids         Lab Results   Component Value Date    NA 141 11/05/2022    K 3.9 11/05/2022    CL 105 11/05/2022    CO2 21 (L) 11/05/2022    BUN 12 11/05/2022    CREATININE 1.1 11/05/2022    GLUCOSE 161 (H) 11/05/2022    CALCIUM 8.7 11/05/2022    PROT 7.0 11/05/2022    LABALBU 4.3 11/05/2022    BILITOT 0.4 11/05/2022    ALKPHOS 71 11/05/2022    AST 23 11/05/2022    ALT 26 11/05/2022    LABGLOM 80 11/05/2022    GLOB 2.7 11/05/2022        Lab Results   Component Value Date    WBC 6.66 11/05/2022    HGB 15.0 11/05/2022    HCT 46.0 11/05/2022    MCV 90.6 11/05/2022    PLT 277 11/05/2022                EMR Dragon/transcription disclaimer:  Much of this encounter note is electronic transcription/translation of spoken language toprinted texts.  The electronic translation of spoken language may be erroneous,  or at times, nonsensical words or phrases may be inadvertently transcribed.  Although I have reviewed the note for such errors, some may stillexist.      An electronic signature was used to authenticate this note.    --Leonor Liv, MD

## 2022-11-10 ENCOUNTER — Inpatient Hospital Stay: Admit: 2022-11-10 | Discharge: 2022-11-10 | Payer: MEDICAID | Primary: Family Medicine

## 2022-11-10 NOTE — Progress Notes (Signed)
Physical Therapy  Daily Treatment Note  Date: 11/10/2022  Patient Name: Danny Ruiz  MRN: [id number]     DOB:   April 21, 1971    Subjective:      PT Visit Information  PT Insurance Information: [employer] [place] Medicaid (auth after eval)  Total # of Visits Approved: 9  Total # of Visits to Date: 3  Plan of Care/Certification Expiration Date: 12/06/22  Progress Note Due Date: 12/04/22  Referring Provider (secondary): Eliezer Bottom, MD  Subjective: Patient denies pain but states he was sore after his last session.    Pain Screening  Patient Currently in Pain: Denies      Treatment Activities:   Exercises  Exercise 1: ++rbilateral shoulder pain, left worse than right; start conservatively, gradually progress  Exercise 2: overhead pulleys--5 mins   -----finger walk right arm--slow, progress to higher, 10 reps; x 5 ea UE  Exercise 3: left arm pendulum with 4# weight--15 reps closckwise,ccw  Exercise 4: forward bent row bilaterally, 4 pound weight 2/15 reps  Exercise 5: supine bilateral Jackins' exercise on wedge, with 2 pound weight -progress to sitting as able--2/15  Exercise 6: supine overhead cane raise, progress to using weight if appropriate--2/15 reps  Exercise 7: simulated bench press with cane, add weight as tolerated--2/15 reps  Exercise 8: shoulder protraction with 3-4# hand weights--2/15 4#  Exercise 10: corner stretch--4 reps, 10- 15 sec hold  Exercise 11: IR/ER and shoulder extension with t-band--2/15 reps red  Exercise 12: scapular retraction with band, elbows at side-- 2/15 reps red band  Exercise 13: T's & Y's with t-band (red to start) 2/15  Exercise 14: sitting rowing (like retraction, but higher)  2/15  red   4/16:  1 x 15 due to significantly limited motion  Exercise 15: chair press  x 10  Exercise 16: standing shoulder walk aways (walk back and forth with steady downward pressure)--10 reps  Exercise 17: golfers stretch with right arm (as tolerated)  5 x 10"  Exercise 18: UBE  lvl x 1      Assessment:    Conditions Requiring Skilled Therapeutic Intervention  Body Structures, Functions, Activity Limitations Requiring Skilled Therapeutic Intervention: Decreased ADL status;Decreased ROM;Decreased tolerance to work activity;Decreased strength;Decreased high-level IADLs;Increased pain;Decreased posture  Assessment: Patient completes exercises per flow sheet this date with some c/o increased BIL shoulder discomfort on and off during session. He was able to toletates 2 sets of the Ys this date. Some cues for technique needed to ensure correct technique for max exercise benefit. He reports decreased shoulder tightness post session and denies pain. Will continue per POC and progress as he tolerates.      Goals:Short Term Goals  Time Frame for Short Term Goals: 3-4 weeks  Short Term Goal 1: Patient to be independent with HEP.  Short Term Goal 2: Patient to be independent with use of home pulleys to improve shoulder ROM.  Short Term Goal 3: Patient to report greater ease getting dressed, putting on belt, performing toiletries.  Long Term Goals  Time Frame for Long Term Goals : 4-6 weeks  Long Term Goal 1: Patient to have >= 165 degrees bilateral shoulder passive ROM, >= 80 degrees bilateral ER, >= 65 degrees bilateral IR.  Long Term Goal 2: Patient to have >= 5-/5 right shoulder flexion, abduction and ER; >= 4+/5 left shoulder flexion, abduction and ER.  Long Term Goal 3: Patient to score <= 25% impairment on the general use portion of the QuickDASH.  Patient Goals  Patient Goals : Decreased pain in shoulders, increased strength in arms and be able to perform ADL's and IADL's easier.    Plan:    Physical Therapy Plan  Plan weeks: 4-6 weeks  Current Treatment Recommendations: Strengthening, ROM, Pain management, Modalities, Patient/Caregiver education & training, Home exercise program, Manual  Timed Code Treatment Minutes: 41 Minutes     Therapy Time   Individual Concurrent Group Co-treatment   Time In 1102         Time Out  1143         Minutes 41         Timed Code Treatment Minutes: 41 Minutes     Electronically signed by Toney Rakes, PTA on 11/10/2022 at 11:55 AM

## 2022-11-13 ENCOUNTER — Encounter: Payer: MEDICAID | Primary: Family Medicine

## 2022-11-13 NOTE — Progress Notes (Signed)
[facility]  OUTPATIENT PHYSICAL THERAPY  DISCHARGE SUMMARY    Date: 12/04/2022  Patient Name: Danny Ruiz        MRN: [id number]    ACCOUNT #: [id number]  DOB: Mar 20, 1971  (52 y.o.)  Gender: male      Diagnosis: shoulder impingement          Total # of Visits Approved: 9  Total # of Visits to Date: 4    Subjective   "I am not getting as much pain as I was., so I feel like therapy is helping." (Comment made at last PT session attended 11/13/22)    Additional Pertinent Hx: 52 year old male referred to PT with diagnosis of shoulder impingement.      Objective  Treatments received included strengthening, ROM, Pain management, Modalities, Patient/Caregiver education & training, Home exercise program, Manual  See objective/subjective data in goals    Assessment  Assessment: 11/13/22 was the last PT session attended. Per the treating PTA's assessment note, patient reportedly did fairly well in session.  He was able to complete all exercises with slight modifications.  Corner stretches were recorded as particularly hard for him in regards to pain.  Patient denied pain pre- and post -session, but some facial grimace noted at times. Per a later communication from Mr. Reichl, he expresessed he no longer wanted to continue therapy. Previous notes appeared to indicate he was improving with therapy. Will formally discharge from PT today as per patient's request.           GOALS   Patient Goals : Decreased pain in shoulders, increased strength in arms and be able to perform ADL's and IADL's easier.  Short Term Goals Completed by 3-4 weeks Current Status Goal Status   Patient to be independent with HEP.       Patient to be independent with use of home pulleys to improve shoulder ROM.       Patient to report greater ease getting dressed, putting on belt, performing toiletries.                                                                   Long Term Goals Completed by 4-6 weeks Current Status Goal Status   Patient to  have >= 165 degrees bilateral shoulder passive ROM, >= 80 degrees bilateral ER, >= 65 degrees bilateral IR.       Patient to have >= 5-/5 right shoulder flexion, abduction and ER; >= 4+/5 left shoulder flexion, abduction and ER.       Patient to score <= 25% impairment on the general use portion of the QuickDASH.                                                                    TREATMENT PLAN      Requires PT Follow-Up: No  Additional Comments: Discharge from PT.       Electronically signed by Trisha Mangle, PT on 12/04/2022 at 10:30 AM

## 2022-11-13 NOTE — Progress Notes (Signed)
Physical Therapy: Daily Note   Patient: Danny Ruiz (52 y.o. male)   Examination Date: 11/13/2022  Plan of Care/Certification Expiration Date: 12/06/22    No data recorded   DOB:  11/16/1970 # of Visits since [facility]:   4   MRN: [id number]  CSN: [id number] Start of Care Date:   11/04/2022   Insurance: Payor: [facility] MEDICAID / Plan: [facility] / Product Type: *No Product type* /   Insurance ID: [id number] - (Medicaid Managed) Secondary Insurance (if applicable):    Referring Physician: Eliezer Bottom, MD Eliezer Bottom, MD   PCP: Eliezer Bottom, MD Visits to Date/Visits Approved: 4 / 9    No Show/Cancelled Appts:   /       Medical Diagnosis: Other specified joint disorders, unspecified shoulder [M25.819]    No data recorded      SUBJECTIVE EXAMINATION   Pain Level: Pain Screening  Patient Currently in Pain: Denies    Patient Comments: Subjective: I am not getting as much pain as I was.  So I feel like therapy is helping.        TREATMENT     Exercises:  Therapeutic exercise (CPT [phone])   Treatment Reasoning    Exercise 1: ++rbilateral shoulder pain, left worse than right; start conservatively, gradually progress  Exercise 2: overhead pulleys--5 mins   -----finger walk right arm--slow, progress to higher, 10 reps; x 5 ea UE;  Exercise 3: left arm pendulum with 4# weight--15 reps closckwise,ccw  Exercise 4: forward bent row bilaterally, 4 pound weight 2/15 reps-at same time  Exercise 5: supine bilateral Jackins' exercise on wedge, with 2 pound weight -progress to sitting as able--2/15  Exercise 6: supine overhead cane raise, progress to using weight if appropriate--2/15 reps  Exercise 7: simulated bench press with cane, add weight as tolerated--2/15 reps  Exercise 8: shoulder protraction with 3-4# hand weights--2/15 4#  Exercise 10: corner stretch--4 reps, 10- 15 sec hold  Exercise 11: IR/ER and shoulder extension with t-band--2/15 reps red  Exercise 12: scapular retraction with band, elbows at side-- 2/15 reps red  band  Exercise 13: T's & Y's with t-band (red to start) 2/15  Exercise 14: sitting rowing (like retraction, but higher)  2/15  red   4/16:  1 x 10 due to significantly limited motion  Exercise 15: chair press  2 x 15  Exercise 16: standing shoulder walk aways (walk back and forth with steady downward pressure)--10 reps  Exercise 17: golfers stretch with right arm (as tolerated)  5 x 10"  Exercise 18: UBE  lvl x 1  (2.5 min fwd/2.5 min bwd)    Limitations addressed: Mobility, Strength, Flexibility, Activity tolerance  Therapist provided: Verbal cuing  Progressed: Complexity of movement                          ASSESSMENT     Assessment: Assessment: Patient did fairly well in session.  He was able to complete all exercises with slight modifications.  Corner stretches were particularly hard for him in regards to pain.  Patient denied pain pre and post session but some facial grimmace noted at times.  Body Structures, Functions, Activity Limitations Requiring Skilled Therapeutic Intervention: Decreased ADL status, Decreased ROM, Decreased tolerance to work activity, Decreased strength, Decreased high-level IADLs, Increased pain, Decreased posture    Post-Treatment Pain Level:      Activity Tolerance: Patient limited by pain; Patient tolerated evaluation without incident  Therapy Prognosis: Good; Fair       GOALS   Patient Goals : Decreased pain in shoulders, increased strength in arms and be able to perform ADL's and IADL's easier.  Short Term Goals Completed by 3-4 weeks Current Status Goal Status   Patient to be independent with HEP.       Patient to be independent with use of home pulleys to improve shoulder ROM.       Patient to report greater ease getting dressed, putting on belt, performing toiletries.                                                                   Long Term Goals Completed by 4-6 weeks Current Status Goal Status   Patient to have >= 165 degrees bilateral shoulder passive ROM, >= 80  degrees bilateral ER, >= 65 degrees bilateral IR.       Patient to have >= 5-/5 right shoulder flexion, abduction and ER; >= 4+/5 left shoulder flexion, abduction and ER.       Patient to score <= 25% impairment on the general use portion of the QuickDASH.                                                                    TREATMENT PLAN   Plan Frequency: 2 x per week  Plan weeks: 4-6 weeks  Current Treatment Recommendations: Strengthening, ROM, Pain management, Modalities, Patient/Caregiver education & training, Home exercise program, Manual           Therapy Time  Individual Time In: 1100       Individual Time Out: 1143  Minutes: 43  Timed Code Treatment Minutes: 43 Minutes     Electronically signed by Eulogio Ditch, PTA  on 11/13/2022 at 12:04 PM   POC NOTE  Electronically signed by Eulogio Ditch, PTA on 11/13/2022 at 12:05 PM

## 2022-11-17 ENCOUNTER — Encounter: Payer: MEDICAID | Primary: Family Medicine

## 2022-11-20 ENCOUNTER — Encounter: Payer: MEDICAID | Primary: Family Medicine

## 2022-11-24 ENCOUNTER — Encounter: Payer: MEDICAID | Primary: Family Medicine

## 2022-11-27 ENCOUNTER — Encounter: Payer: MEDICAID | Primary: Family Medicine

## 2022-11-30 ENCOUNTER — Encounter: Payer: MEDICAID | Attending: Family Medicine | Primary: Family Medicine

## 2022-11-30 ENCOUNTER — Ambulatory Visit: Admit: 2022-11-30 | Discharge: 2022-11-30 | Payer: MEDICAID | Attending: Surgery | Primary: Family Medicine

## 2022-11-30 DIAGNOSIS — D171 Benign lipomatous neoplasm of skin and subcutaneous tissue of trunk: Secondary | ICD-10-CM

## 2022-11-30 NOTE — Progress Notes (Signed)
[name] is a 52 year old male who presents with a complaint of a lump on his left lower back as well as back pain. The patient states that he has noticed the lump on his back for 3-4 years. It has not gotten any larger in size. He has a lot of back pain, mostly when walking will have tightness in the area that improves when he sits down. He describes the pain as feeling like he is getting hit in the area. He has seen PCPs prior to moving to this area and had treatment for musculoskeletal pain. He had an US of the area that does show a small likely lipoma. The patient states that he does think it hurts more when the lipoma is pressed on.    Past Medical History:   Diagnosis Date    Asthma     DVT (deep venous thrombosis) (HCC)     had on two occasions in the past    GERD (gastroesophageal reflux disease)     Pulmonary embolism (HCC) 10/01/2022    Tobacco abuse      Past Surgical History:   Procedure Laterality Date    KIDNEY REMOVAL Left     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"     Current Outpatient Medications   Medication Sig Dispense Refill    Glucosamine-Chondroitin (GLUCOSAMINE CHONDR COMPLEX PO) Take by mouth in the morning and at bedtime      acetaminophen (TYLENOL) 325 MG tablet Take 2 tablets by mouth every 6 hours as needed for Pain      apixaban (ELIQUIS) 5 MG TABS tablet Take 1 tablet by mouth 2 times daily 60 tablet 5    famotidine (PEPCID) 20 MG tablet Take 1 tablet by mouth Daily with supper 90 tablet 1    albuterol sulfate HFA (VENTOLIN HFA) 108 (90 Base) MCG/ACT inhaler Inhale 2 puffs into the lungs every 6 hours as needed for Wheezing 18 g 3    montelukast (SINGULAIR) 10 MG tablet Take 1 tablet by mouth daily 90 tablet 1     No current facility-administered medications for this visit.     Allergies: Nsaids    Family History   Problem Relation Age of Onset    No Known Problems Mother     No Known Problems Father     No Known Problems Brother     No Known Problems Half-Sister     No  Known Problems Half-Sister     No Known Problems Daughter     No Known Problems Daughter     No Known Problems Son        Social History     Tobacco Use    Smoking status: Every Day     Current packs/day: 1.00     Average packs/day: 1 pack/day for 15.0 years (15.0 ttl pk-yrs)     Types: Cigarettes    Smokeless tobacco: Never    Tobacco comments:     Started at age 31, has had a PPD on avrg since he began, smoked 40 pack-years   Substance Use Topics    Alcohol use: Not Currently       Review of Systems   Constitutional:  Positive for fatigue. Negative for chills and fever.   HENT:  Positive for congestion, ear pain, rhinorrhea and tinnitus.    Eyes:  Negative for pain and redness.   Respiratory:  Positive for shortness of breath and wheezing. Negative for cough.  Cardiovascular:  Positive for leg swelling. Negative for chest pain.   Gastrointestinal:  Positive for abdominal distention, abdominal pain, diarrhea, nausea and vomiting.   Endocrine: Negative for polydipsia and polyphagia.   Genitourinary:  Negative for dysuria and hematuria.   Musculoskeletal:  Positive for arthralgias, back pain, gait problem, joint swelling and myalgias.   Skin:  Negative for rash and wound.   Neurological:  Positive for headaches. Negative for dizziness.   Psychiatric/Behavioral:  Positive for confusion and sleep disturbance.        Physical Exam  Vitals reviewed.   Constitutional:       General: He is not in acute distress.  HENT:      Head: Normocephalic and atraumatic.      Nose: Nose normal.   Eyes:      General: No scleral icterus.     Pupils: Pupils are equal, round, and reactive to light.   Cardiovascular:      Rate and Rhythm: Normal rate and regular rhythm.   Pulmonary:      Effort: Pulmonary effort is normal. No respiratory distress.   Abdominal:      General: There is no distension.      Palpations: Abdomen is soft.   Musculoskeletal:        Arms:       Cervical back: Neck supple. No rigidity.      Comments: Small mobile  area about 1.5 cm, consistent with lipoma     Skin:     General: Skin is warm and dry.   Neurological:      General: No focal deficit present.      Mental Status: He is alert. Mental status is at baseline.   Psychiatric:         Mood and Affect: Mood normal.         Behavior: Behavior normal.     EXAM:  ULTRASOUND OF THE LEFT LOWER BACK SOFT TISSUES.     HISTORY: Left lower back palpable abnormality.     TECHNIQUE: Limited sonography of the left lower back soft tissues was performed.  Images were obtained and stored in a permanent archive.     COMPARISON: None.     FINDINGS:  At the site of the palpable abnormality, there is a subcutaneous hyperechoic nodule measuring 1.3 x 0.6 x 1.2 cm.     IMPRESSION:  1.  Probable lipoma measuring 1.3 cm in maximal dimension.           ______________________________________   Electronically signed by: Darol Destine M.D.  Date:     10/26/2022  Time:    22:06     [place] images personally reviewed by me, agree with radiologist's interpretation    Assessment and plan:  52 year old male with lower back lipoma and back pain  I discussed with the patient that this lipoma is quite superficial and small, so there is a good chance that the lipoma is not the cause of his pain. I agree with previous doctors that this does sound more like musculoskeletal pain. However, there is a small chance that it is contributing. Discussed going ahead with excision. Discussed risks of bleeding and infection. Explained that if he still has pain, then may need referral to ortho. Patient expressed understanding and agreement with this plan.    Trellis Moment, MD  11/30/2022  11:06 AM

## 2022-12-01 ENCOUNTER — Encounter: Payer: MEDICAID | Primary: Family Medicine

## 2022-12-04 ENCOUNTER — Encounter: Payer: MEDICAID | Primary: Family Medicine

## 2022-12-04 ENCOUNTER — Encounter

## 2022-12-04 ENCOUNTER — Encounter: Admit: 2022-12-04 | Discharge: 2022-12-04 | Payer: MEDICAID | Attending: Family Medicine | Primary: Family Medicine

## 2022-12-04 DIAGNOSIS — J4540 Moderate persistent asthma, uncomplicated: Secondary | ICD-10-CM

## 2022-12-04 MED ORDER — VARENICLINE TARTRATE 0.5 MG PO TABS
0.5 MG | ORAL_TABLET | ORAL | 0 refills | Status: AC
Start: 2022-12-04 — End: 2023-05-31

## 2022-12-04 MED ORDER — FLUTICASONE-SALMETEROL 100-50 MCG/ACT IN AEPB
100-50 MCG/ACT | Freq: Two times a day (BID) | RESPIRATORY_TRACT | 0 refills | Status: DC
Start: 2022-12-04 — End: 2023-01-04

## 2022-12-04 NOTE — Progress Notes (Signed)
Danny Ruiz (DOB:  Sep 30, 1970) is a 52 y.o. male,Established patient, here for evaluation of the following chief complaint(s):  Discuss Medications (Needs an inhaler he is using his rescue inhaler all the time and was wanting something that he could use daily ) and Nicotine Dependence      Assessment & Plan   ASSESSMENT/PLAN:  1. Moderate persistent asthma, unspecified whether complicated  -     fluticasone-salmeterol (ADVAIR DISKUS) 100-50 MCG/ACT AEPB diskus inhaler; Inhale 1 puff into the lungs in the morning and 1 puff in the evening., Disp-30 each, R-0Normal  2. Cigarette nicotine dependence without complication  -     varenicline (CHANTIX) 0.5 MG tablet; Take 1-2 tablets by mouth See Admin Instructions 0.5mg  DAILY for 3 days followed by 0.5mg  TWICE DAILY for 4 days followed by 1mg  TWICE DAILY, Disp-57 tablet, R-0Normal      I discussed with patient I think that his persistent shortness of breath likely has some context in his current PE.  Discussed with him that this is going to take time to resorb and this shortness of breath should likely improve with time.  That being said if it works slowly this I would not expect the albuterol to have any effect and would not expect him to feel better with its use.  Given this I am willing to start patient on a maintenance inhaler in the context of his asthma.  Will start patient on Advair discus at this time.  I do think would be a great idea for patient to stop smoking.  I am agreeable to starting patient on Chantix and I have sent through the starter pack at this time.  Patient instructed to start taking the medication with a planned quit date 1 week after starting the medication.  Plan duration of around 12 weeks if possible.  I did discuss with patient common side effects include vivid dreams and if this occurs and he is unable to tolerate it to notify us.        Return in about 2 months (around 02/03/2023), or if symptoms worsen or fail to improve.          Subjective   SUBJECTIVE/OBJECTIVE:  Danny Ruiz is a 52 y.o. male who presents wanting to do something different with his inhalers.  Patient says he has had some occasional wheezing and has felt persistently short of breath.  Patient says that when he takes his albuterol it does seem to help however he is having to use this very frequently and more frequently when he is more active.  Patient has never been on a maintenance inhaler but says that he was diagnosed with asthma as a child and had to use his albuterol frequently.  Patient shortness of breath has worsened since he was diagnosed with a PE and he has been maintaining his Eliquis as prescribed.  Patient has a follow-up with CT surgery in a month or so.  Patient is interested in stopping smoking.  Patient currently smoking 1 pack/day and has never tried any cessation aids.  Patient is interested in potentially trying Chantix.  Patient says he has successfully quit once before though he replaced his smoking with vape usage.  Patient says he is needing some forms filled out primarily for food stamps.  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  Patient thinks that he will be doing better in a few months and plans to return to work at that time.                Review of Systems   Constitutional:  Negative for activity change and fever.   HENT:  Negative for congestion.    Eyes:  Negative for visual disturbance.   Respiratory:  Positive for cough, shortness of breath and wheezing. Negative for chest tightness.    Cardiovascular:  Negative for chest pain.   Gastrointestinal:  Negative for abdominal pain and blood in stool.   Genitourinary:  Negative for difficulty urinating and urgency.   Neurological:  Negative for weakness and headaches.   Psychiatric/Behavioral:  Negative for confusion.           Objective   Physical  Exam  Vitals reviewed.   Constitutional:       General: He is not in acute distress.     Appearance: Normal appearance. He is not ill-appearing.   HENT:      Head: Normocephalic and atraumatic.   Cardiovascular:      Rate and Rhythm: Normal rate and regular rhythm.      Pulses: Normal pulses.      Heart sounds: Normal heart sounds. No murmur heard.  Pulmonary:      Effort: Pulmonary effort is normal. No respiratory distress.      Breath sounds: Normal breath sounds. No wheezing or rhonchi.   Chest:      Chest wall: No tenderness.   Abdominal:      General: Abdomen is flat. Bowel sounds are normal. There is no distension.      Palpations: Abdomen is soft.      Tenderness: There is no abdominal tenderness.   Musculoskeletal:         General: No swelling.   Skin:     General: Skin is warm and dry.   Neurological:      General: No focal deficit present.      Mental Status: He is alert.            Vitals:    12/04/22 0935   BP: 126/80   Pulse: 74   Resp: 18   Temp: 96.9 F (36.1 C)   SpO2: 97%        Current Outpatient Medications   Medication Sig Dispense Refill    fluticasone-salmeterol (ADVAIR DISKUS) 100-50 MCG/ACT AEPB diskus inhaler Inhale 1 puff into the lungs in the morning and 1 puff in the evening. 30 each 0    varenicline (CHANTIX) 0.5 MG tablet Take 1-2 tablets by mouth See Admin Instructions 0.5mg  DAILY for 3 days followed by 0.5mg  TWICE DAILY for 4 days followed by 1mg  TWICE DAILY 57 tablet 0    Glucosamine-Chondroitin (GLUCOSAMINE CHONDR COMPLEX PO) Take by mouth in the morning and at bedtime      acetaminophen (TYLENOL) 325 MG tablet Take 2 tablets by mouth every 6 hours as needed for Pain      apixaban (ELIQUIS) 5 MG TABS tablet Take 1 tablet by mouth 2 times daily 60 tablet 5    famotidine (PEPCID) 20 MG tablet Take 1 tablet by mouth Daily with supper 90 tablet 1    albuterol sulfate HFA (VENTOLIN HFA) 108 (90 Base) MCG/ACT inhaler Inhale 2 puffs into the lungs every 6 hours as needed for Wheezing 18 g  3    montelukast (SINGULAIR) 10 MG tablet Take 1 tablet by  mouth daily 90 tablet 1     No current facility-administered medications for this visit.      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"      Allergies   Allergen Reactions    Nsaids         Lab Results   Component Value Date    NA 141 11/05/2022    K 3.9 11/05/2022    CL 105 11/05/2022    CO2 21 (L) 11/05/2022    BUN 12 11/05/2022    CREATININE 1.1 11/05/2022    GLUCOSE 161 (H) 11/05/2022    CALCIUM 8.7 11/05/2022    BILITOT 0.4 11/05/2022    ALKPHOS 71 11/05/2022    AST 23 11/05/2022    ALT 26 11/05/2022    LABGLOM 80 11/05/2022    GLOB 2.7 11/05/2022        Lab Results   Component Value Date    WBC 6.66 11/05/2022    HGB 15.0 11/05/2022    HCT 46.0 11/05/2022    MCV 90.6 11/05/2022    PLT 277 11/05/2022                EMR Dragon/transcription disclaimer:  Much of this encounter note is electronic transcription/translation of spoken language toprinted texts.  The electronic translation of spoken language may be erroneous, or at times, nonsensical words or phrases may be inadvertently transcribed.  Although I have reviewed the note for such errors, some may stillexist.      An electronic signature was used to authenticate this note.    --Eliezer Bottom, MD

## 2022-12-08 NOTE — Telephone Encounter (Addendum)
-----   Message from Truitt Merle, RN sent at 12/07/2022  4:26 PM CDT -----  Regarding: FW: About a procedure   Contact: [phone]    ----- Message -----  From: Kayren Eaves, RN  Sent: 12/07/2022   9:58 AM CDT  To: Truitt Merle, RN  Subject: RE: About a procedure                            Forwarding to you    ----- Message -----  From: Joaquim Lai"  Sent: 12/07/2022   9:40 AM CDT  To: [facility] Staff  Subject: About a procedure                                Hello, Zierre has an appointment to have a spot removes on his back. They want him to stop taking his eliquis for 48 hours before the procedure on Thursday May 16th at 1 pm.    I am wanting to know if that is ok??   That means he would not  take it Tuesday evening,  all day Wednesday,  and Thursday morning..  my number is [phone] please let me know asap.  Thank you & have a good day.         Spoke with Ms. Rancourt on 12/07/2022 and send if the area on the back that is requiring oval is essential to be completed now or can this be delayed for 4 weeks to allow for at least a 90-day window between recent pulmonary emboli/DVT and holding anticoagulation.     Ms. Hoeppner verbalized understanding will reach back out for further recommendations from the provider performing the procedure.

## 2022-12-08 NOTE — Telephone Encounter (Signed)
Forwarding to Dr Lund

## 2022-12-10 ENCOUNTER — Encounter: Payer: MEDICAID | Attending: Surgery | Primary: Family Medicine

## 2023-01-03 ENCOUNTER — Encounter

## 2023-01-04 ENCOUNTER — Encounter: Payer: Self-pay | Admitting: Cardiology

## 2023-01-04 MED ORDER — ADVAIR DISKUS 100-50 MCG/ACT IN AEPB
100-50 | RESPIRATORY_TRACT | 3 refills | Status: DC
Start: 2023-01-04 — End: 2023-02-01

## 2023-01-04 NOTE — Telephone Encounter (Signed)
Patient called answering service regarding low BP, woozy, fatigue.   He reports SBP of 98 mmHG.   He increased fluid intake and repeat SBP 117 mmHG and later asymptomatic.   No chest pain now.   Advised him to go to ED if he has CP similar to his prior ACS.   Otherwise no new recommendations as he is now asymptomatic.   Will update his primary cardiologist.   Tessa Lerner, DO, [facility]  Pager:  [phone] Office: [phone]

## 2023-01-04 NOTE — Telephone Encounter (Signed)
Can yall schedule him please

## 2023-01-07 ENCOUNTER — Other Ambulatory Visit (HOSPITAL_COMMUNITY): Payer: Self-pay | Admitting: Internal Medicine

## 2023-01-07 DIAGNOSIS — C3490 Malignant neoplasm of unspecified part of unspecified bronchus or lung: Secondary | ICD-10-CM

## 2023-01-07 DIAGNOSIS — C7931 Secondary malignant neoplasm of brain: Secondary | ICD-10-CM

## 2023-01-10 ENCOUNTER — Emergency Department: Admit: 2023-01-10 | Payer: MEDICAID | Primary: Family Medicine

## 2023-01-10 ENCOUNTER — Inpatient Hospital Stay: Admit: 2023-01-10 | Discharge: 2023-01-10 | Disposition: A | Discharge status: Home / Self Care | Payer: MEDICAID

## 2023-01-10 DIAGNOSIS — S6990XA Unspecified injury of unspecified wrist, hand and finger(s), initial encounter: Secondary | ICD-10-CM

## 2023-01-10 MED ORDER — LIDOCAINE HCL 1% INJ (MIXTURES ONLY)
1 | Freq: Once | INTRAMUSCULAR | Status: AC
Start: 2023-01-10 — End: 2023-01-10
  Administered 2023-01-10: 21:00:00 5 mL via INTRADERMAL

## 2023-01-10 MED ORDER — CEPHALEXIN 500 MG PO CAPS
500 | ORAL_CAPSULE | Freq: Three times a day (TID) | ORAL | 0 refills | Status: AC
Start: 2023-01-10 — End: 2023-01-15

## 2023-01-10 MED ORDER — TETANUS-DIPHTH-ACELL PERTUSSIS 5-2.5-18.5 LF-MCG/0.5 IM SUSP
Freq: Once | INTRAMUSCULAR | Status: AC
Start: 2023-01-10 — End: 2023-01-10
  Administered 2023-01-10: 21:00:00 0.5 mL via INTRAMUSCULAR

## 2023-01-10 MED FILL — LIDOCAINE HCL (PF) 1 % IJ SOLN: 1 % | INTRAMUSCULAR | Qty: 5

## 2023-01-10 MED FILL — BOOSTRIX 5-2.5-18.5 LF-MCG/0.5 IM SUSP: INTRAMUSCULAR | Qty: 0.5

## 2023-01-10 NOTE — ED Provider Notes (Signed)
[facility] EMERGENCY DEPT  eMERGENCY dEPARTMENT eNCOUnter      Pt Name: Danny Ruiz  MRN: [id number]  Birthdate [birth year]  Date of evaluation: 01/10/2023  Provider: Waunita Schooner, APRN    CHIEF COMPLAINT       Chief Complaint   Patient presents with    Finger Injury     Fish hook to right middle finger; tetanus not utd         HISTORY OF PRESENT ILLNESS   (Location/Symptom, Timing/Onset,Context/Setting, Quality, Duration, Modifying Factors, Severity)  Note limiting factors.   Danny Ruiz is a 52 y.o. male who presents to the emergency department with a fish hook in his right middle finger.  Has caught several fish with the hook today before it got him.  Has cut off all the hook except the barb    The history is provided by the patient.   Foreign Body  Location:  Skin  Suspected object: fish hook.  Chronicity:  New      NursingNotes were reviewed.    REVIEW OF SYSTEMS    (2-9 systems for level 4, 10 or more for level 5)     Review of Systems   Skin:  Positive for wound.       Except as noted above the remainder of the review of systems was reviewed and negative.       PAST MEDICAL HISTORY     Past Medical History:   Diagnosis Date    Asthma     DVT (deep venous thrombosis) (HCC)     had on two occasions in the past    GERD (gastroesophageal reflux disease)     Pulmonary embolism (HCC) 10/01/2022    Tobacco abuse          SURGICALHISTORY       Past Surgical History:   Procedure Laterality Date    KIDNEY REMOVAL Left     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"         CURRENT MEDICATIONS       Discharge Medication List as of 01/10/2023  5:35 PM        CONTINUE these medications which have NOT CHANGED    Details   fluticasone-salmeterol (ADVAIR DISKUS) 100-50 MCG/ACT AEPB diskus inhaler INHALE 1PUFF INTO THE LUNGS EVERY MORNING AND 1 PUFF EVERY EVENING, Disp-60 each, R-3Normal      varenicline (CHANTIX) 0.5 MG tablet Take 1-2 tablets by mouth See Admin Instructions 0.5mg  DAILY for 3 days followed by 0.5mg  TWICE  DAILY for 4 days followed by 1mg  TWICE DAILY, Disp-57 tablet, R-0Normal      Glucosamine-Chondroitin (GLUCOSAMINE CHONDR COMPLEX PO) Take by mouth in the morning and at bedtimeHistorical Med      acetaminophen (TYLENOL) 325 MG tablet Take 2 tablets by mouth every 6 hours as needed for PainHistorical Med      apixaban (ELIQUIS) 5 MG TABS tablet Take 1 tablet by mouth 2 times daily, Disp-60 tablet, R-5Normal      famotidine (PEPCID) 20 MG tablet Take 1 tablet by mouth Daily with supper, Disp-90 tablet, R-1Normal      albuterol sulfate HFA (VENTOLIN HFA) 108 (90 Base) MCG/ACT inhaler Inhale 2 puffs into the lungs every 6 hours as needed for Wheezing, Disp-18 g, R-3Normal      montelukast (SINGULAIR) 10 MG tablet Take 1 tablet by mouth daily, Disp-90 tablet, R-1Normal  Nsaids    FAMILY HISTORY       Family History   Problem Relation Age of Onset    No Known Problems Mother     No Known Problems Father     No Known Problems Brother     No Known Problems Half-Sister     No Known Problems Half-Sister     No Known Problems Daughter     No Known Problems Daughter     No Known Problems Son           SOCIAL HISTORY       Social History     Socioeconomic History    Marital status: Married     Spouse name: Mrs. 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   Tobacco Use    Smoking status: Every Day     Current packs/day: 1.00     Average packs/day: 1 pack/day for 54.5 years (54.5 ttl pk-yrs)     Types: Cigarettes     Start date: 1985    Smokeless tobacco: Never    Tobacco comments:     Started at age 44, has had a PPD on avrg since he began, smoked 40 pack-years   Vaping Use    Vaping Use: Never used   Substance and Sexual Activity    Alcohol use: Not Currently    Drug use: Not Currently     Comment: tried MJ a few times in the past    Sexual activity: Yes      Partners: Female     Comment: Has 3 kids   Social History Narrative    CODE STATUS: Full Code    HEALTH CARE PROXY: Mrs. Tal Mccreedy, his wife, [phone]    AMBULATES: independently    DOMICILED: has steps to enter his home, no stairs inside home, lives with his wife the kids are all grown, has a cat and dog in the home     Social Determinants of Health     Financial Resource Strain: Low Risk  (10/21/2022)    Overall Financial Resource Strain (CARDIA)     Difficulty of Paying Living Expenses: Not hard at all   Food Insecurity: No Food Insecurity (10/21/2022)    Hunger Vital Sign     Worried About Running Out of Food in the Last Year: Never true     Ran Out of Food in the Last Year: Never true   Transportation Needs: Unknown (10/21/2022)    PRAPARE - Transportation     Lack of Transportation (Non-Medical): No   Physical Activity: Unknown (10/21/2022)    Exercise Vital Sign     Days of Exercise per Week: Patient declined   Housing Stability: Unknown (10/21/2022)    Housing Stability Vital Sign     Unstable Housing in the Last Year: No       SCREENINGS    Glasgow Coma Scale  Eye Opening: Spontaneous  Best Verbal Response: Oriented  Best Motor Response: Obeys commands  Glasgow Coma Scale Score: 15        PHYSICAL EXAM    (up to 7 for level 4, 8 or more for level 5)     ED Triage Vitals [01/10/23 1647]   BP Temp Temp src Pulse Respirations SpO2 Height Weight   (!) 131/94  97.3 F (36.3 C) -- 73 16 98 % -- --       Physical Exam  Vitals and nursing note reviewed.   Constitutional:       Appearance: Normal appearance. He is well-developed.   HENT:      Head: Normocephalic and atraumatic.   Eyes:      General: No scleral icterus.        Right eye: No discharge.         Left eye: No discharge.   Cardiovascular:      Rate and Rhythm: Normal rate.   Pulmonary:      Effort: No respiratory distress.   Musculoskeletal:        Hands:       Cervical back: Normal range of motion and neck supple.   Neurological:      Mental  Status: He is alert and oriented to person, place, and time.   Psychiatric:         Behavior: Behavior normal.         RESULTS     EKG: All EKG's are interpreted by the Emergency Department Physician who either signs or Co-signsthis chart in the absence of a cardiologist.        RADIOLOGY:   Non-plain filmimages such as CT, Ultrasound and MRI are read by the radiologist. Plain radiographic images are visualized and preliminarily interpreted by the emergency physician with the below findings:      Interpretation per the Radiologist below, if available at the time of this note:    XR FINGER RIGHT (MIN 2 VIEWS)   Final Result   There is a linear metallic foreign body measuring about 13 mm in length superimposed over the distal third digit suggesting a portion of a fish hook or similar.  No fracture is seen.  No additional foreign bodies.           ______________________________________    Electronically signed by: Jearld Fenton M.D.   Date:     01/10/2023   Time:    17:28             ED BEDSIDEULTRASOUND:   Performed by ED Physician -none    LABS:  Labs Reviewed - No data to display    All other labs were within normal range or not returned as of this dictation.    EMERGENCY DEPARTMENT COURSE and DIFFERENTIALDIAGNOSIS/MDM:   Vitals:    Vitals:    01/10/23 1647   BP: (!) 131/94   Pulse: 73   Resp: 16   Temp: 97.3 F (36.3 C)   SpO2: 98%           MDM  Number of Diagnoses or Management Options  Foreign body (FB) in soft tissue: new and requires workup  Diagnosis management comments: Danny Ruiz is a 52 y.o. male who presents to the emergency department with a fish hook in his right middle finger.  Has caught several fish with the hook today before it got him.  Has cut off all the hook except the barb     Pt had planned to push it through.  Xray shows it to be close to bone.  Fish hook removed easily with forceps.  Wound cleansed with saline and iodine and will give antibiotics as this was a dirty hook.  High risk of  infection       Amount and/or Complexity of Data Reviewed  Tests in the radiology section of CPT: ordered and reviewed  CONSULTS:  None    PROCEDURES:  Unless otherwise noted below, none     Foreign Body    Date/Time: 01/10/2023 5:32 PM    Performed by: Waunita Schooner, APRN  Authorized by: Waunita Schooner, APRN    Consent:     Consent obtained:  Verbal    Consent given by:  Patient    Risks, benefits, and alternatives were discussed: yes      Risks discussed:  Pain  Universal protocol:     Procedure explained and questions answered to patient or proxy's satisfaction: yes      Patient identity confirmed:  Verbally with patient  Location:     Location:  Finger    Finger location:  R middle finger    Depth:  Intradermal    Tendon involvement:  None  Pre-procedure details:     Imaging:  X-ray    Neurovascular status: intact      Preparation: Patient was prepped and draped in usual sterile fashion    Anesthesia:     Anesthesia method:  Local infiltration    Local anesthetic:  Lidocaine 1% w/o epi  Procedure type:     Procedure complexity:  Simple  Procedure details:     Incision length:  .1    Dissection of underlying tissues: no      Bloodless field: yes      Removal mechanism:  Hemostat    Foreign bodies recovered:  1    Description:  Fish hook    Intact foreign body removal: yes    Post-procedure details:     Neurovascular status: intact        FINAL IMPRESSION      1. Foreign body (FB) in soft tissue        DISPOSITION/PLAN   DISPOSITION Decision To Discharge 01/10/2023 05:33:54 PM      PATIENT REFERRED TO:  Eliezer Bottom, MD  [address]  [place] [address]  [phone]            DISCHARGE MEDICATIONS:  Discharge Medication List as of 01/10/2023  5:35 PM        START taking these medications    Details   cephALEXin (KEFLEX) 500 MG capsule Take 1 capsule by mouth 3 times daily for 5 days, Disp-15 capsule, R-0Normal                (Please note that portions of this note were completed with a voice  recognitionprogram.  Efforts were made to edit the dictations but occasionally words are mis-transcribed.)    Waunita Schooner, APRN (electronically signed)           Waunita Schooner, APRN  01/10/23 2248

## 2023-01-13 ENCOUNTER — Ambulatory Visit: Payer: Medicare Other | Admitting: Cardiology

## 2023-01-13 NOTE — Progress Notes (Deleted)
Primary Physician/Referring:  Jacob [name], No Pcp Per  Jacob [name] ID: Jacob [name], male    DOB: [birth year], 52 y.o.   MRN: [id number]  No chief complaint on file.  HPI:    Jacob [name]  is a 52 y.o. 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 Stage IV metastatic lung cancer diagnosed in September 2020 with excellent response to capmatinib.  Jacob [name] made an appointment to see me due to dizziness and feeling fatigued.  He has not had any syncope.  Continues to have occasional episodes of angina but has not used any sublingual nitroglycerin.  States that he has significantly reduced smoking cigarettes to <5/day.  He thinks he may be coming close to quitting.  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[facility])    Past Surgical History:  Procedure Laterality Date   CARDIAC CATHETERIZATION     LEFT HEART CATH AND CORONARY ANGIOGRAPHY N/A 07/01/2021   Procedure: LEFT HEART CATH AND CORONARY ANGIOGRAPHY;  Surgeon: Yates Decamp, MD;  Location: [facility] INVASIVE CV LAB;  Service: Cardiovascular;  Laterality: N/A;   TESTICLE SURGERY     Family History  Problem Relation Age of Onset   Heart disease Mother    Diabetes Mother     Social History   Tobacco Use   Smoking status: Every Day    Packs/day: 0.25    Years: 40.00    Additional pack years: 0.00    Total pack years: 10.00    Types: Cigarettes   Smokeless tobacco: Never   Tobacco comments:    Given phone number to [facility] quit line encouraged smoking cessation  Substance Use Topics   Alcohol use: No   Marital Status: Single   ROS  Review of Systems   Cardiovascular:  Positive for leg swelling (Chronic right leg > Left). Negative for chest pain and dyspnea on exertion.  Neurological:  Positive for dizziness.    Objective  There were no vitals taken for this visit.     09/21/2022    2:11 PM 07/13/2022   11:17 AM 06/02/2022    4:04 AM  Vitals with BMI  Height 5\' 8"  5\' 8"  5\' 8"   Weight 180 lbs 3 oz 178 lbs 3 oz 172 lbs  BMI 27.41 27.1 26.16  Systolic 114 114   Diastolic 64 73   Pulse 94 73     No data found.     Physical Exam Vitals reviewed.  Neck:     Vascular: No carotid bruit or JVD.  Cardiovascular:     Rate and Rhythm: Normal rate and regular rhythm.     Pulses: Intact distal pulses.          Radial pulses are 2+ on the right side and 2+ on the left side.     Heart sounds: S1 normal and S2 normal. No murmur heard.    No gallop.  Pulmonary:     Effort: Pulmonary effort is normal.     Breath sounds: Normal breath sounds.  Abdominal:     General: Bowel sounds are normal.     Palpations: Abdomen is soft.  Musculoskeletal:     Right  lower leg: Edema (1+ pitting below knee) present.     Left lower leg: Edema (Trace pitting) present.    Laboratory examination:   Recent Labs    06/02/22 0417 07/13/22 1155  NA 137 143  K 4.3 5.0  CL 109 110*  CO2 23 21  GLUCOSE 115* 87  BUN 25* 19  CREATININE 1.41* 1.32*  CALCIUM 8.5* 8.7  GFRNONAA 60*  --       Latest Ref Rng & Units 07/13/2022   11:55 AM 06/02/2022    4:17 AM 08/06/2021    9:44 AM  CMP  Glucose 70 - 99 mg/dL 87  308  92   BUN 6 - 24 mg/dL 19  25  15    Creatinine 0.76 - 1.27 mg/dL 6.57  8.46  9.62   Sodium 134 - 144 mmol/L 143  137  140   Potassium 3.5 - 5.2 mmol/L 5.0  4.3  4.6   Chloride 96 - 106 mmol/L 110  109  105   CO2 20 - 29 mmol/L 21  23  22    Calcium 8.7 - 10.2 mg/dL 8.7  8.5  9.0   Total Protein 6.0 - 8.5 g/dL 5.5     Total Bilirubin 0.0 - 1.2 mg/dL 0.3     Alkaline Phos 44 - 121 IU/L 168     AST 0 - 40 IU/L 19     ALT 0 - 44 IU/L 20          Latest Ref Rng & Units 07/13/2022   11:55 AM 06/02/2022    4:17 AM 08/06/2021    9:44 AM  CBC  WBC 3.4 - 10.8 x10E3/uL 11.7  11.5  9.2   Hemoglobin 13.0 - 17.7 g/dL 95.2  84.1  32.4   Hematocrit 37.5 - 51.0 % 46.1  44.0  43.5   Platelets 150 - 450 x10E3/uL 234  215  241     Lipid Panel Recent Labs    01/23/22 1105 07/13/22 1155  CHOL 146 81*  TRIG 99 41  LDLCALC 87 35  HDL 41 34*   Lipoprotein (a) <75.0 nmol/L 151.3 High 07/15/2022 180.8 High   01/23/2022     HEMOGLOBIN A1C Lab Results  Component Value Date   HGBA1C 6.3 (H) 07/01/2021   MPG 134.11 07/01/2021    External labs:    Labs 09/03/2022:  TSH 69.075.  Free T4 normal at 0.61.  Sodium 139, potassium 5.2, BUN 14, creatinine 1.51, EGFR 55 mill.  Hb 16.6/HCT 50.0, platelets 235.  Normal indicis.  Labs 09/01/2021:  BUN 18, creatinine 1.40, EGFR 61 mL, potassium 4.2, ALT minimally elevated at 148, otherwise ALT and AST are normal.  Hb 14.6/HCT 44.0, platelets 217, normal indicis.  Allergies  No Known Allergies  Final Medications at End of Visit    Current Outpatient Medications:    atorvastatin (LIPITOR) 80 MG tablet, TAKE 1 TABLET BY MOUTH EVERY DAY, Disp: 90 tablet, Rfl: 1   BAYER LOW DOSE 81 MG chewable tablet, Chew 81 mg by mouth daily., Disp: , Rfl:    ezetimibe (ZETIA) 10 MG tablet, Take 1 tablet (10 mg total) by mouth every evening., Disp: 90 tablet, Rfl: 3   levothyroxine (SYNTHROID) 125 MCG tablet, Take 125 mcg by mouth daily before breakfast., Disp: , Rfl:    nitroGLYCERIN (NITROSTAT) 0.4 MG SL tablet, Place 1 tablet (0.4 mg total) under the tongue every 5 (five) minutes as needed for chest pain., Disp: 30 tablet, Rfl: 12  TABRECTA 200 MG tablet, Take 400 mg by mouth in the morning and at bedtime., Disp: , Rfl:    Radiology:   DG Chest Portable 07/01/2021 The heart size and mediastinal contours are within normal limits. Both lungs are clear. The visualized skeletal structures are  unremarkable. Impression: No active disease  CT head with and without contrast 03/19/2022: 1. No metastatic disease or acute intracranial abnormality. 2. Small chronic bilateral cerebellar infarcts. 3.  No significant change from 10/01/2021 and 05/19/2021.  CT scan of the chest and abdomen 08/10/2022: 1. Slight interval decrease in size of the 10 mm right middle lobe pulmonary nodule, with similar interposed scarring in the right middle lobe. 2. Stable prominent/mildly enlarged bilateral iliac side chain lymph nodes and prominent mediastinal lymph nodes. 3. Emphysema  Cardiac Studies:   Lower Extremity Venous Duplex  10/31/2020: - There is no evidence of deep vein thrombosis in the lower extremity.  However, portions of this examination were limited- see technologist  comments above.  LEFT:  - There is no evidence of deep vein thrombosis in the lower extremity.  However, portions of this examination were limited- see technologist comments above.   Left Heart Catheterization 07/01/21:  LV: 141/21, EDP 36 mmHg.  Ao 150/95, mean 118 mmHg.  There was no pressure gradient across the aortic valve. LM: Large vessel. LAD: Thrombotic/ulcerated proximal LAD 80 to 90% stenosis with TIMI II flow.  Apical LAD diffusely diseased.  Direct stenting ONYX FRONTIER 3.5X30 mm DES. Maximum pressure: 16 atm. Inflation time: 45 sec. Stent strut is well apposed.  TIMI II to TIMI-3 flow at the end of the procedure, distal embolization of the apical LAD of no clinical consequence with occlusion.  It is also diffusely diseased at the apex. CX: Very large-caliber vessel.  Mid segment has a 40% stenosis. 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  There are contralateral collaterals noted and ipsilateral collaterals noted to the distal right coronary artery.  The artery is flush occluded after the RV branch, I did try to open up the vessel however unable to obtain access to the true  lumen.  However with the attempted angioplasty, contralateral flow improved all the way back to the proximal segment of the RCA from the LAD. Small to moderate sized RCA and severely diffusely diseased.   07/01/2021: ONYX FRONTIER 3.5X30 mm DES to proximal LAD  Echocardiogram 07/01/2021: 1. Left ventricular ejection fraction, by estimation, is 45 to 50%. The left ventricle has mildly decreased function. The left ventricle  demonstrates regional wall motion abnormalities, There is akinesis of the left  ventricular, apical inferoseptal wall and anteroseptal wall. Left ventricular diastolic parameters were normal.   2. Right ventricular systolic function is normal. The right ventricular size is normal.  3. The mitral valve is normal in structure. No evidence of mitral valve regurgitation. No evidence of mitral stenosis.  4. The aortic valve is normal in structure. Aortic valve regurgitation is not visualized. No aortic stenosis is present.  EKG:   EKG 09/21/2022: Normal sinus rhythm at the rate of 75 bpm, incomplete right bundle branch block.  Normal EKG.  No change from 07/13/2022.  Assessment     ICD-10-CM   1. Coronary artery disease of native artery of native heart with stable angina pectoris (HCC)  I25.118     2. Dizziness and giddiness  R42     3. 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   No orders of the defined types were placed in this encounter.  Recommendations:   Jacob [name] is a 52 y.o. 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 Stage IV metastatic lung cancer diagnosed in September 2020 with excellent response to capmatinib.  1. Dizziness and  giddiness Jacob [name]'s dizziness and giddiness is related to low blood pressure, I will discontinue metoprolol succinate 25 mg daily.  He has not been able to tolerate any other medications for blood pressure, he had also discontinued losartan in view of low blood pressure.  Will continue with aspirin alone along with statin therapy..  2. Coronary artery disease of native artery of native heart with stable angina pectoris (HCC) With regard to coronary disease he has had stable angina pectoris class I-II.  Will continue to monitor this.  He has not used sublingual nitroglycerin over the past 3 months.  Will repeat echocardiogram to follow-up on any wall motion abnormality.  Lipids under excellent control.  With regard to metastatic lung cancer, I reviewed his CT scan, he continues to show significant response to chemotherapy and there is no new lesions noted.  I congratulated him.  He has reduced his smoking to 5 cigarettes a day and I again reinforced complete abstinence.    Yates Decamp, MD, [facility] 01/13/2023, 11:48 AM Office: [phone] Fax: [phone] Pager: [phone]

## 2023-01-18 ENCOUNTER — Encounter: Payer: MEDICAID | Attending: Family Medicine | Primary: Family Medicine

## 2023-01-19 ENCOUNTER — Ambulatory Visit (HOSPITAL_COMMUNITY): Payer: Medicare Other

## 2023-01-27 ENCOUNTER — Ambulatory Visit: Payer: MEDICAID | Primary: Family Medicine

## 2023-01-27 DIAGNOSIS — I2694 Multiple subsegmental pulmonary emboli without acute cor pulmonale: Secondary | ICD-10-CM

## 2023-01-27 LAB — POCT CREATININE
Est, Glom Filt Rate: 60 — AB (ref >60)
POC Creatinine: 1.4 mg/dL — ABNORMAL HIGH (ref 0.3–1.3)

## 2023-01-27 MED ORDER — IOPAMIDOL 76 % IV SOLN
76 | Freq: Once | INTRAVENOUS | Status: AC | PRN
Start: 2023-01-27 — End: 2023-01-27
  Administered 2023-01-27: 14:00:00 75 mL via INTRAVENOUS

## 2023-02-01 ENCOUNTER — Ambulatory Visit: Admit: 2023-02-01 | Discharge: 2023-02-01 | Payer: MEDICAID | Attending: Family Medicine | Primary: Family Medicine

## 2023-02-01 ENCOUNTER — Encounter

## 2023-02-01 DIAGNOSIS — S30861A Insect bite (nonvenomous) of abdominal wall, initial encounter: Secondary | ICD-10-CM

## 2023-02-01 DIAGNOSIS — W57XXXA Bitten or stung by nonvenomous insect and other nonvenomous arthropods, initial encounter: Secondary | ICD-10-CM

## 2023-02-01 LAB — LIPID PANEL
Cholesterol, Total: 219 mg/dL — ABNORMAL HIGH (ref 160–199)
HDL: 32 mg/dL — ABNORMAL LOW (ref 55–121)
LDL Cholesterol: 155 mg/dL (ref <100)
Triglycerides: 161 mg/dL — ABNORMAL HIGH (ref 0–149)

## 2023-02-01 LAB — HEPATITIS C ANTIBODY: Hep C Ab Interp: NONREACTIVE

## 2023-02-01 LAB — HEMOGLOBIN A1C: Hemoglobin A1C: 5.8 % (ref 4.0–6.0)

## 2023-02-01 MED ORDER — ALBUTEROL SULFATE HFA 108 (90 BASE) MCG/ACT IN AERS
10890 (90 Base) MCG/ACT | Freq: Four times a day (QID) | RESPIRATORY_TRACT | 3 refills | Status: AC | PRN
Start: 2023-02-01 — End: 2023-05-31

## 2023-02-01 MED ORDER — APIXABAN 5 MG PO TABS
5 MG | ORAL_TABLET | Freq: Two times a day (BID) | ORAL | 5 refills | Status: AC
Start: 2023-02-01 — End: 2023-05-31

## 2023-02-01 MED ORDER — FLUTICASONE-SALMETEROL 100-50 MCG/ACT IN AEPB
100-50 MCG/ACT | RESPIRATORY_TRACT | 3 refills | Status: AC
Start: 2023-02-01 — End: 2023-08-17

## 2023-02-01 MED ORDER — FAMOTIDINE 20 MG PO TABS
20 | ORAL_TABLET | Freq: Every day | ORAL | 1 refills | Status: AC
Start: 2023-02-01 — End: ?

## 2023-02-01 MED ORDER — MONTELUKAST SODIUM 10 MG PO TABS
10 MG | ORAL_TABLET | Freq: Every day | ORAL | 1 refills | Status: AC
Start: 2023-02-01 — End: 2023-08-17

## 2023-02-01 NOTE — Progress Notes (Signed)
Danny Ruiz (DOB:  05-12-1971) is a 52 y.o. male,Established patient, here for evaluation of the following chief complaint(s):  Follow-up (Pt here for 3 month f/u ) and Insect Bite (Pt states he pulled a tick of his abdomen about 4 days ago and it has some redness to the area. Pt states he has had HA, body aches, fever and SOB)      Assessment & Plan   ASSESSMENT/PLAN:  1. Tick bite of abdomen, initial encounter  -     Rickettsia Rickettsii [facility] Spotted Fever -RMSF) Antibodies IgG & IgM by IFA; Future  -     B. Burgdorferi Antibodies by WB; Future  -     Ehrlichia Antibody Panel; Future  2. Multiple subsegmental pulmonary emboli without acute cor pulmonale (HCC)  -     apixaban (ELIQUIS) 5 MG TABS tablet; Take 1 tablet by mouth 2 times daily, Disp-60 tablet, R-5Normal  3. Moderate persistent asthma, unspecified whether complicated  -     fluticasone-salmeterol (ADVAIR DISKUS) 100-50 MCG/ACT AEPB diskus inhaler; INHALE 1PUFF INTO THE LUNGS EVERY MORNING AND 1 PUFF EVERY EVENING, Disp-60 each, R-3Normal  4. Screening due  -     Lipid Panel; Future  -     HIV Rapid 1&2; Future  -     Hepatitis C Antibody; Future  -     Hemoglobin A1C; Future    Will obtain testing for ehrlichiosis, [facility] spotted fever, and Lyme.  Will start patient on doxycycline if any of these result as positive.  Maintain follow-up with hematology.  I did review his CT scan with him briefly which does seem to be negative for any PE at this time      Return in about 4 months (around 06/04/2023).         Subjective   SUBJECTIVE/OBJECTIVE:  Danny Ruiz is a 52 y.o. male who presents for follow-up and due to concerns over tick bite.  Patient says that he thinks it was attached for under 1 day when he pulled it off earlier last week.  Since that time patient has been experiencing fevers, headaches, and bodyaches and he had been concerned that he may have gotten some sort of infection from this.  Patient says he has had  multiple tick bites this year and thinks he has had upwards of 20-30 since the year began.  [place] when doing this.  Patient denies any rashes  Patient is scheduled for follow-up with hematology on the 22nd.  Patient is getting a repeat CT as it is now 3 months out since his PE.  Patient says he still mildly short of breath                  Review of Systems   Constitutional:  Negative for activity change and fever.   HENT:  Negative for congestion.    Eyes:  Negative for visual disturbance.   Respiratory:  Positive for shortness of breath. Negative for chest tightness and wheezing.    Cardiovascular:  Negative for chest pain.   Gastrointestinal:  Negative for abdominal pain and blood in stool.   Genitourinary:  Negative for difficulty urinating and urgency.   Skin:  Negative for rash.   Neurological:  Negative for weakness and headaches.   Psychiatric/Behavioral:  Negative for confusion.           Objective   Physical Exam  Vitals  reviewed.   Constitutional:       General: He is not in acute distress.     Appearance: Normal appearance. He is not ill-appearing.   HENT:      Head: Normocephalic and atraumatic.   Cardiovascular:      Rate and Rhythm: Normal rate and regular rhythm.      Pulses: Normal pulses.      Heart sounds: Normal heart sounds. No murmur heard.  Pulmonary:      Effort: Pulmonary effort is normal. No respiratory distress.      Breath sounds: Normal breath sounds. No wheezing or rhonchi.   Chest:      Chest wall: No tenderness.   Abdominal:      General: Abdomen is flat. Bowel sounds are normal. There is no distension.      Palpations: Abdomen is soft.      Tenderness: There is no abdominal tenderness.   Musculoskeletal:         General: No swelling.   Skin:     General: Skin is warm and dry.      Findings: No rash.   Neurological:      General: No focal deficit present.      Mental Status: He is alert.            Vitals:    02/01/23 1346    BP: 118/84   Pulse: 74   Resp: 18   Temp: 97.4 F (36.3 C)   SpO2: 97%        Current Outpatient Medications   Medication Sig Dispense Refill    apixaban (ELIQUIS) 5 MG TABS tablet Take 1 tablet by mouth 2 times daily 60 tablet 5    albuterol sulfate HFA (VENTOLIN HFA) 108 (90 Base) MCG/ACT inhaler Inhale 2 puffs into the lungs every 6 hours as needed for Wheezing 18 g 3    fluticasone-salmeterol (ADVAIR DISKUS) 100-50 MCG/ACT AEPB diskus inhaler INHALE 1PUFF INTO THE LUNGS EVERY MORNING AND 1 PUFF EVERY EVENING 60 each 3    famotidine (PEPCID) 20 MG tablet Take 1 tablet by mouth Daily with supper 90 tablet 1    montelukast (SINGULAIR) 10 MG tablet Take 1 tablet by mouth daily 90 tablet 1    Glucosamine-Chondroitin (GLUCOSAMINE CHONDR COMPLEX PO) Take by mouth in the morning and at bedtime      acetaminophen (TYLENOL) 325 MG tablet Take 2 tablets by mouth every 6 hours as needed for Pain      varenicline (CHANTIX) 0.5 MG tablet Take 1-2 tablets by mouth See Admin Instructions 0.5mg  DAILY for 3 days followed by 0.5mg  TWICE DAILY for 4 days followed by 1mg  TWICE DAILY (Patient not taking: Reported on 02/01/2023) 57 tablet 0     No current facility-administered medications for this visit.      Family History   Problem Relation Age of Onset    No Known Problems Mother     No Known Problems Father     No Known Problems Brother     No Known Problems Half-Sister     No Known Problems Half-Sister     No Known Problems Daughter     No Known Problems Daughter     No Known Problems Son       Past Medical History:   Diagnosis Date    Asthma     DVT (deep venous thrombosis) (HCC)     had on two occasions in the past    GERD (gastroesophageal reflux disease)  Pulmonary embolism (HCC) 10/01/2022    Tobacco abuse       Past Surgical History:   Procedure Laterality Date    KIDNEY REMOVAL Left     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"      Allergies   Allergen Reactions    Nsaids         Lab Results   Component Value  Date    NA 141 11/05/2022    K 3.9 11/05/2022    CL 105 11/05/2022    CO2 21 (L) 11/05/2022    BUN 12 11/05/2022    CREATININE 1.4 (H) 01/27/2023    GLUCOSE 161 (H) 11/05/2022    CALCIUM 8.7 11/05/2022    BILITOT 0.4 11/05/2022    ALKPHOS 71 11/05/2022    AST 23 11/05/2022    ALT 26 11/05/2022    LABGLOM 60 (A) 01/27/2023    GLOB 2.7 11/05/2022        Lab Results   Component Value Date    WBC 6.66 11/05/2022    HGB 15.0 11/05/2022    HCT 46.0 11/05/2022    MCV 90.6 11/05/2022    PLT 277 11/05/2022                EMR Dragon/transcription disclaimer:  Much of this encounter note is electronic transcription/translation of spoken language toprinted texts.  The electronic translation of spoken language may be erroneous, or at times, nonsensical words or phrases may be inadvertently transcribed.  Although I have reviewed the note for such errors, some may stillexist.      An electronic signature was used to authenticate this note.    --Eliezer Bottom, MD

## 2023-02-02 ENCOUNTER — Encounter

## 2023-02-02 LAB — HIV RAPID 1&2
HIV-1 P24 Ag: NONREACTIVE
Rapid HIV 1&2: NONREACTIVE

## 2023-02-03 LAB — B. BURGDORFI ANTIBODIES BY WB
Borrelia burgdorferi Ab IgG: NEGATIVE
Lyme Disease Ab, Quant, IgM: NEGATIVE

## 2023-02-03 MED ORDER — SIMVASTATIN 40 MG PO TABS
40 MG | ORAL_TABLET | Freq: Every evening | ORAL | 1 refills | Status: AC
Start: 2023-02-03 — End: 2023-05-31

## 2023-02-03 NOTE — Telephone Encounter (Signed)
From: Ludger Nutting  To: Dr. Eliezer Bottom  Sent: 02/03/2023 9:09 AM CDT  Subject: Cholesterol medicine.     Yes please send in the medication for the cholesterol and I'll go pick it up. Thank you

## 2023-02-04 LAB — RICKETTSIA RICKETTSII (ROCKY MOUNTAIN SPOTTED FEVER - RMSF) ANTIBODIES IGG & IGM BY IFA
IgG,Rocky Mtn Spotted Fever: <1:64 {titer}
Rocky Mountain Spotted Fever Ab IgM,: <1:64 {titer}

## 2023-02-04 LAB — EHRLICHIA ANTIBODY PANEL
Ehrlichia Chaffeensis Ab, IgG: <1:64 {titer}
Ehrlichia Chaffeensis Ab, IgM: <1:16 {titer}

## 2023-02-10 NOTE — Telephone Encounter (Signed)
Called Patient and reminded patient of their appointment on 02/15/2023 and patient confirmed they would be here. Reminded patient to just come at appointment time, and to not come at the lab appointment time. Reminded patient that we will not check them in any more than 30 minutes before appointment time.

## 2023-02-12 ENCOUNTER — Encounter

## 2023-02-15 ENCOUNTER — Ambulatory Visit: Admit: 2023-02-15 | Discharge: 2023-02-15 | Payer: MEDICAID | Attending: Nurse Practitioner | Primary: Family Medicine

## 2023-02-15 ENCOUNTER — Encounter

## 2023-02-15 ENCOUNTER — Inpatient Hospital Stay: Admit: 2023-02-15 | Discharge: 2023-02-15 | Payer: MEDICAID | Primary: Family Medicine

## 2023-02-15 DIAGNOSIS — Z720 Tobacco use: Secondary | ICD-10-CM

## 2023-02-15 DIAGNOSIS — I2694 Multiple subsegmental pulmonary emboli without acute cor pulmonale: Secondary | ICD-10-CM

## 2023-02-15 DIAGNOSIS — Z86711 Personal history of pulmonary embolism: Secondary | ICD-10-CM

## 2023-02-15 LAB — CBC WITH AUTO DIFFERENTIAL
Basophils %: 1 % (ref 0.1–1.2)
Basophils Absolute: 0.07 10*3/uL (ref 0.01–0.08)
Eosinophils %: 2.6 % (ref 0.7–7.0)
Eosinophils Absolute: 0.19 10*3/uL (ref 0.04–0.54)
Hematocrit: 45 % (ref 40.1–51.0)
Hemoglobin: 15.5 g/dL (ref 13.7–17.5)
Lymphocytes %: 39.6 % (ref 19.3–53.1)
Lymphocytes Absolute: 2.84 10*3/uL (ref 1.18–3.74)
MCH: 29.9 pg (ref 25.7–32.2)
MCHC: 34.4 g/dL (ref 32.3–36.5)
MCV: 86.7 fL (ref 79.0–92.2)
MPV: 9 fL (ref 7.4–10.4)
Monocytes %: 10.6 % (ref 4.7–12.5)
Monocytes Absolute: 0.76 10*3/uL (ref 0.24–0.82)
Neutrophils %: 45.8 % (ref 34.0–71.1)
Neutrophils Absolute: 3.28 10*3/uL (ref 1.56–6.13)
Platelets: 304 10*3/uL (ref 163–337)
RBC: 5.19 M/uL (ref 4.63–6.08)
RDW: 13.1 % (ref 11.6–14.4)
WBC: 7.17 10*3/uL (ref 4.23–9.07)

## 2023-02-15 LAB — COMPREHENSIVE METABOLIC PANEL
ALT: 24 U/L (ref 21–72)
AST: 29 U/L (ref 17–59)
Albumin: 4.3 g/dL (ref 3.5–5.2)
Alkaline Phosphatase: 98 U/L (ref 40–130)
Anion Gap: 7 mmol/L (ref 7–19)
BUN: 10 mg/dL (ref 9–20)
CO2: 32 mmol/L — ABNORMAL HIGH (ref 22–29)
Calcium: 8.9 mg/dL (ref 8.4–10.2)
Chloride: 102 mmol/L (ref 98–111)
Creatinine: 1.3 mg/dL — ABNORMAL HIGH (ref 0.6–1.2)
Est, Glom Filt Rate: 66 (ref >60)
Glucose: 100 mg/dL (ref 74–106)
Potassium: 4.8 mmol/L (ref 3.5–5.1)
Sodium: 141 mmol/L (ref 137–145)
Total Bilirubin: 0.5 mg/dL (ref 0.2–1.3)
Total Protein: 7.2 g/dL (ref 6.3–8.2)

## 2023-02-15 LAB — PSA SCREENING: PSA: 0.38 ng/mL (ref 0.00–4.00)

## 2023-02-15 NOTE — Progress Notes (Signed)
Progress Note      Pt Name: Danny Ruiz  Birthdate: [birth year]  MRN: [id number]    Date of evaluation: 02/15/2023  History Obtained From:  patient, electronic medical record    CHIEF COMPLAINT:    Chief Complaint   Patient presents with    Follow-up     Multiple subsegmental pulmonary emboli without acute cor pulmonale (HCC)     HISTORY OF PRESENT ILLNESS:    Danny Ruiz is a 52 y.o. 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  Thrombophilia workup on 11/05/2022 was unrevealing for any underlying clotting disorders.  Current recommendation is for long-term anticoagulation with Eliquis 5 mg p.o. twice daily.  Shana returns today in scheduled follow-up for evaluation, lab monitoring, review lab results and further treatment recommendations.  He reports continuing to have some shortness of air with exertion and inspiratory wheezing that he correlates with his asthma, follow-up CT of the chest on 01/27/2023 reported complete resolvement of bilateral pulmonary emboli.  He reports having mild swelling in his right lower extremity at the end of the day, he does wear compression socks while at work.    Today's clinic visit to include physical assessment, review of systems, any lab or radiographic findings that were available and plan of care are documented below.    HEMATOLOGIC HISTORY:     Diagnosis  Unprovoked bilateral pulmonary emboli with DVT in the right lower extremity (gastrocnemius vein), March 2024: PESI score: 102 points. Class III, Intermediate Risk: 3.2-7.1% 30-day mortality in this group.  Thrombophilia workup on 11/05/2022 ruled out underlying clotting deficiency  History of provoked PE x 2 treated with Lovenox first episode and Eliquis second episode x 3 months      Treatment summary:  10/01/2022-patient was started on IV heparin and  transitioned to Eliquis at discharge.  Recommendation is for lifelong anticoagulation with Eliquis 5 mg p.o. twice daily    Danny Ruiz was seen by Dr Ronda Fairly on 10/02/2022 for DVT/PE after presenting to the ER department with complaints of shortness of breath for a few weeks.  The patient denies any recent trauma, surgery, prolonged immobilization.  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  He received Lovenox for his first episode and Eliquis x 3 months for his second episode.  The patient does not have a primary care.  He has not had any age-appropriate cancer screening.  The patient is a daily smoker since age 47.  The patient denies any weight loss.  Denies any hematuria, melena, hematochezia or hematemesis.    10/01/2022-CT pulmonary protocol remarkable for-Multiple filling defects in the pulmonary arteries consistent with thromboemboli in the distal right main pulmonary artery multiple segmental and subsegmental branches of the right upper and middle lobes in the left lower lobe. - Right ventricle/Left ventricle ratio (normal <0.9): Normal. - Main pulmonary artery: Normal caliber.  Lung Parenchyma, Pleura, and Airways: Mild thickening of the peripheral airways and scattered mucus plugging. No dense airspace consolidation. No suspicious nodule/mass. Dependent atelectasis in the lower lobes.  No pleural effusion or pneumothorax. 1 cm  subpleural nodule at the left lower lobe base on axial image 80, coronal image 83.  Thoracic Inlet, Mediastinum, and Hila: Thyroid gland is unremarkable. No lymphadenopathy.  Heart, Vessels, and Pericardium: Normal caliber aorta. No visible coronary artery calcifications.  No cariomegaly. No pericardial effusion.  Bones and Soft Tissues: No acute osseous abnormality. No mass or adenopathy.  Upper Abdomen: 2 cm cyst noted in the right lobe of the liver.  Post left nephrectomy.  Impression: 1 cm subpleural nodule in the left lower lobe could  be post infectious/inflammatory or neoplastic.      09/30/21- 2D echo-normal LVEF 55-60%. No regional wall motion. Mild concentric left ventricular hypertrophy. Normal  diastolic function. Normal right ventricular size with preserved RV function (TAPSE 23 mm).  Normal bi-atrial size.  No clinically significant valvular stenosis or aortic regurgitation. Aortic root and ascending aorta dimensions are within normal limits.  The IVC is normal. The rhythm is sinus.    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  No additional DVT visualized.  No evidence of SVT or reflux noted at this time.    10/01/2022-patient was started on IV heparin.     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    11/03/2022 CTA pulmonary- reported complete or near complete resolution of previously seen pulmonary emboli bilaterally. Question only trace residual right lower lobe pulmonary embolus at a branch point versus artifact. No acute abnormality in the thorax. Scattered bilateral pulmonary nodularity is again noted without significant change measuring up to 5 mm.   Previously seen 1 cm nodular opacity adjacent the left hemidiaphragm in the left lower lobe is no longer visualized and likely represents resolution of an inflammatory process.  Overall improving bronchial wall thickening to the left lower lobe in comparison to prior.     11/05/2022 Serology results  Antithrombin III activity 97%  Prothrombin gene mutation negative  Protein C functional 132%  Activated Protein C Resistance 3.96  Protein S Antigen 111%  Factor 5 Leiden negative    01/27/2023 CTA pulmonary- reported no pulmonary thromboembolism. Unchanged bilateral pulmonary nodes measuring up to 0.5 cm.  According to the Fleischner Society  guidelines, in a low risk patient no further follow-up is necessary.  However, in high risk patient obtaining CT chest in 12 months is optional. Unchanged mild right hilar lymphadenopathy, nonspecific but likely reactive. Status post left nephrectomy.     Age-appropriate health screening:  Has had no routine health screening  Again today encouraged him to Cologuard test kit    Past Medical History:    Past Medical History:   Diagnosis Date    Asthma     DVT (deep venous thrombosis) (HCC)     had on two occasions in the past    GERD (gastroesophageal reflux disease)     Pulmonary embolism (HCC) 10/01/2022    Tobacco abuse        Past Surgical History:    Past Surgical History:   Procedure Laterality Date    KIDNEY REMOVAL Left     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"       Current Medications:    Current Outpatient Medications   Medication Sig Dispense Refill    simvastatin (ZOCOR) 40 MG tablet Take 1 tablet by mouth nightly 90 tablet 1    apixaban (ELIQUIS) 5 MG TABS tablet Take 1 tablet by mouth 2 times daily 60 tablet 5    albuterol sulfate HFA (VENTOLIN HFA) 108 (90 Base) MCG/ACT inhaler Inhale 2 puffs  into the lungs every 6 hours as needed for Wheezing 18 g 3    fluticasone-salmeterol (ADVAIR DISKUS) 100-50 MCG/ACT AEPB diskus inhaler INHALE 1PUFF INTO THE LUNGS EVERY MORNING AND 1 PUFF EVERY EVENING 60 each 3    famotidine (PEPCID) 20 MG tablet Take 1 tablet by mouth Daily with supper 90 tablet 1    montelukast (SINGULAIR) 10 MG tablet Take 1 tablet by mouth daily 90 tablet 1    Glucosamine-Chondroitin (GLUCOSAMINE CHONDR COMPLEX PO) Take by mouth in the morning and at bedtime      acetaminophen (TYLENOL) 325 MG tablet Take 2 tablets by mouth every 6 hours as needed for Pain      varenicline (CHANTIX) 0.5 MG tablet Take 1-2 tablets by mouth See Admin Instructions 0.5mg  DAILY for 3 days followed by 0.5mg  TWICE DAILY for 4 days followed by 1mg  TWICE DAILY (Patient not taking: Reported on 02/01/2023) 57  tablet 0     No current facility-administered medications for this visit.        Allergies:   Allergies   Allergen Reactions    Nsaids        Social History:    Social History     Tobacco Use    Smoking status: Every Day     Current packs/day: 1.00     Average packs/day: 1 pack/day for 54.6 years (54.6 ttl pk-yrs)     Types: Cigarettes     Start date: 89    Smokeless tobacco: Never    Tobacco comments:     Started at age 49, has had a PPD on avrg since he began, smoked 40 pack-years   Vaping Use    Vaping Use: Never used   Substance Use Topics    Alcohol use: Not Currently    Drug use: Not Currently     Comment: tried MJ a few times in the past       Family History:   Family History   Problem Relation Age of Onset    No Known Problems Mother     No Known Problems Father     No Known Problems Brother     No Known Problems Half-Sister     No Known Problems Half-Sister     No Known Problems Daughter     No Known Problems Daughter     No Known Problems Son        Vitals:  Vitals:    02/15/23 0939   BP: 122/70   Site: Left Upper Arm   Position: Sitting   Pulse: 78   Temp: 98.9 F (37.2 C)   TempSrc: Temporal   SpO2: 97%   Weight: 95.6 kg (210 lb 12.8 oz)   Height: 1.854 m (6\' 1" )        Subjective   REVIEW OF SYSTEMS:   Review of Systems   Constitutional: Negative.  Negative for chills, diaphoresis and fever.   HENT: Negative.  Negative for congestion, ear pain, hearing loss, nosebleeds, sore throat and tinnitus.    Eyes: Negative.  Negative for pain, discharge and redness.   Respiratory:  Positive for shortness of breath (Mild with exertion). Negative for cough and wheezing.    Cardiovascular:  Positive for leg swelling (Right lower extremity at the end of the day, resolves in the a.m.). Negative for chest pain and palpitations.   Gastrointestinal: Negative.  Negative for abdominal pain, blood in stool, constipation, diarrhea, nausea and vomiting.   Endocrine: Negative for polydipsia.   Genitourinary:  Negative for  dysuria, flank pain, frequency, hematuria and urgency.   Musculoskeletal: Negative.  Negative for back pain, myalgias and neck pain.   Skin: Negative.  Negative for rash.   Neurological: Negative.  Negative for dizziness, tremors, seizures, weakness and headaches.   Hematological:  Does not bruise/bleed easily.   Psychiatric/Behavioral: Negative.  The patient is not nervous/anxious.        Objective   PHYSICAL EXAM:  Physical Exam  Vitals reviewed.   Constitutional:       General: He is not in acute distress.     Appearance: He is well-developed.   HENT:      Head: Normocephalic and atraumatic.      Mouth/Throat:      Pharynx: Uvula midline.      Tonsils: No tonsillar exudate.   Eyes:      General: Lids are normal.      Conjunctiva/sclera: Conjunctivae normal.      Pupils: Pupils are equal, round, and reactive to light.   Neck:      Thyroid: No thyroid mass or thyromegaly.      Vascular: No JVD.      Trachea: Trachea normal. No tracheal deviation.   Cardiovascular:      Rate and Rhythm: Normal rate and regular rhythm.      Pulses: Normal pulses.      Heart sounds: Normal heart sounds.   Pulmonary:      Effort: Pulmonary effort is normal. No respiratory distress.      Breath sounds: Normal breath sounds. No wheezing or rales.   Chest:      Chest wall: No tenderness.   Abdominal:      General: Bowel sounds are normal. There is no distension.      Palpations: Abdomen is soft. There is no mass.      Tenderness: There is no abdominal tenderness. There is no guarding.   Musculoskeletal:         General: No tenderness or deformity.      Cervical back: Normal range of motion and neck supple.      Comments: Range of motion within normal limits x4 extremities   Skin:     General: Skin is warm.      Findings: No bruising, erythema or rash.   Neurological:      Mental Status: He is alert and oriented to person, place, and time.      Cranial Nerves: No cranial nerve deficit.      Coordination: Coordination normal.   Psychiatric:          Behavior: Behavior normal.         Thought Content: Thought content normal.         Labs reviewed today:  Lab Results   Component Value Date    WBC 7.17 02/15/2023    HGB 15.5 02/15/2023    HCT 45.0 02/15/2023    MCV 86.7 02/15/2023    PLT 304 02/15/2023     Lab Results   Component Value Date    NEUTROABS 3.28 02/15/2023     Lab Results   Component Value Date    NA 141 02/15/2023    K 4.8 02/15/2023    CL 102 02/15/2023    CO2 32 (H) 02/15/2023    BUN 10 02/15/2023    CREATININE 1.3 (H) 02/15/2023    GLUCOSE 100 02/15/2023    CALCIUM 8.9 02/15/2023    BILITOT 0.5 02/15/2023    ALKPHOS 98 02/15/2023  AST 29 02/15/2023    ALT 24 02/15/2023    LABGLOM 66 02/15/2023    GLOB 2.7 11/05/2022       ASSESSMENT/PLAN:      1. Unprovoked bilateral pulmonary emboli with DVT in the right lower extremity (gastrocnemius vein) March 2024, he has history of DVT on 2 prior occasions that were provoked.  Thrombophilia workup on 11/05/2022 was unrevealing for any underlying clotting deficiency.      Current recommendation is for long-term anticoagulation with Eliquis 5 mg p.o. twice daily.    Reports compliant with Eliquis 5 mg p.o. twice daily.    I reviewed the following findings with Danny:  11/05/2022 Serology results  Antithrombin III activity 97%  Prothrombin gene mutation negative  Protein C functional 132%  Activated Protein C Resistance 3.96  Protein S Antigen 111%  Factor 5 Leiden negative    01/27/2023 CTA pulmonary- reported no pulmonary thromboembolism. Unchanged bilateral pulmonary nodes measuring up to 0.5 cm.  According to the Fleischner Society guidelines, in a low risk patient no further follow-up is necessary.  However, in high risk patient obtaining CT chest in 12 months is optional. Unchanged mild right hilar lymphadenopathy, nonspecific but likely reactive. Status post left nephrectomy.     -Continue Eliquis 5 mg p.o. twice daily: apixaban (ELIQUIS) 5 MG TABS tablet; Take 1 tablet by mouth 2 times daily   Dispense: 60 tablet; Refill: 5  -Encouraged wearing compression socks and elevating right lower extremity is much as possible.    2. Tobacco abuse: Currently smoking 1 pack/day for the last 35 years: Reports that he has Chantix and not yet initiated  We talked about the importance of quitting smoking for approximately 4-5 minutes. Specifically, we discussed the risk related tobacco including but not limited to carcinoma, cardiovascular disease, stroke, and financial loss. I advised to quit smoking and offered resources to include nicotine patches to help with the craving. The patient is contemplated at this time. I will follow-up on smoking cessation during the next visit and continue to encourage quitting tobacco use.    3.  Routine health screening:  Strongly recommend completing a Cologuard  Obtain a baseline PSA level today      4. Bilateral pulmonary nodes measuring up to 0.5 cm with mild right hilar lymphadenopathy, suspected to be reactive, CTA of the chest 01/27/2023.  (CTA of the chest on 05/31/2023 reported 1 cm subpleural nodule in the left lower lobe) History of asthma and currently symptomatic with inspiratory wheezing with exertion.    -Referral to pulmonology, Dr. Riley Churches for assistance with asthma and review of subcentimeter pulmonary nodules  -Will repeat CT scan of the chest at 28-month interval in January 2025    I discussed all of the above findings included in the assessment and plan with the patient and the patient is in agreement to move forward with current recommendations/treatment.  I have addressed all of their questions and concerns that were verbalized.         FOLLOW UP:  Follow-up appointment made for 6 months, or sooner, if needed bilateral history of pulmonary emboli and right lower extremity DVT and long-term anticoagulation management along with bilateral subcentimeter pulmonary nodules with lymphadenopathy  Continue to follow with other medical providers as recommended  Labs at next  visit: CBC and CMP    EMR Dragon/Transcription disclaimer:   Much of this encounter note is an electronic transcription/translation of spoken language to printed text. The electronic translation of spoken language may permit  erroneous, or at times, nonsensical words or phrases to be inadvertently transcribed; although attempts have made to review the note for such errors, some may still exist.  Please excuse any unrecognized transcription errors and contact us if the error is unintelligible or needs documented correction.  Also, portions of this note have been copied forward, however, changed to reflect the most current clinical status of this patient.    Electronically signed by Daisey Must, APRN on 02/15/2023 at 10:32 AM  I, Truitt Merle, am starting this note as a registered nurse for Thomasene Ripple, APRN.

## 2023-02-17 ENCOUNTER — Other Ambulatory Visit (HOSPITAL_COMMUNITY): Payer: Self-pay | Admitting: Internal Medicine

## 2023-02-17 DIAGNOSIS — C3490 Malignant neoplasm of unspecified part of unspecified bronchus or lung: Secondary | ICD-10-CM

## 2023-02-22 ENCOUNTER — Encounter: Payer: Self-pay | Admitting: Cardiology

## 2023-02-22 ENCOUNTER — Ambulatory Visit: Payer: Medicare Other | Admitting: Cardiology

## 2023-02-22 VITALS — BP 108/65 | HR 69 | Resp 16 | Ht 68.0 in | Wt 178.0 lb

## 2023-02-22 DIAGNOSIS — I25118 Atherosclerotic heart disease of native coronary artery with other forms of angina pectoris: Secondary | ICD-10-CM

## 2023-02-22 DIAGNOSIS — F17200 Nicotine dependence, unspecified, uncomplicated: Secondary | ICD-10-CM

## 2023-02-22 DIAGNOSIS — E559 Vitamin D deficiency, unspecified: Secondary | ICD-10-CM

## 2023-02-22 DIAGNOSIS — E7800 Pure hypercholesterolemia, unspecified: Secondary | ICD-10-CM

## 2023-02-22 DIAGNOSIS — R7989 Other specified abnormal findings of blood chemistry: Secondary | ICD-10-CM

## 2023-02-22 NOTE — Progress Notes (Signed)
Primary Physician/Referring:  Patient, No Pcp Per  Patient ID: Jacob [name], male    DOB: November 06, 1970, 52 y.o.   MRN: [id number]  Chief Complaint  Patient presents with   Coronary artery disease of native artery of native heart wi   Fatigue   Hypotention   Dizziness   HPI:    Jacob [name]  is a 52 y.o. 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 Medical history significant for seminoma of the left testicle, stage IV metastatic lung cancer diagnosed in September 2020 with excellent response to capmatinib..  Patient made an appointment to see me due to dizziness and feeling fatigued.  He has not had any syncope.  Continues to have occasional episodes of angina but has not used any sublingual nitroglycerin.  States that he has significantly reduced smoking cigarettes to <5/day.  He thinks he may be coming close to quitting.  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[facility])    Past Surgical History:  Procedure Laterality Date   CARDIAC CATHETERIZATION     LEFT HEART CATH AND CORONARY ANGIOGRAPHY N/A 07/01/2021   Procedure: LEFT HEART CATH AND CORONARY ANGIOGRAPHY;  Surgeon: Yates Decamp, MD;  Location: [facility];  Service: Cardiovascular;  Laterality: N/A;   TESTICLE SURGERY     Family History  Problem Relation Age of Onset   Heart disease Mother    Diabetes Mother     Social History   Tobacco Use   Smoking status: Every Day    Current packs/day: 0.25    Average packs/day: 0.3 packs/day for 40.0 years (10.0 ttl pk-yrs)    Types: Cigarettes   Smokeless tobacco: Never   Tobacco comments:    Given phone number to  quit line encouraged smoking cessation  Substance Use  Topics   Alcohol use: No   Marital Status: Single   ROS  Review of Systems  Cardiovascular:  Positive for leg swelling (Chronic). Negative for chest pain and dyspnea on exertion.  Neurological:  Positive for dizziness.   Objective  Blood pressure 108/65, pulse 69, resp. rate 16, height 5\' 8"  (1.727 m), weight 178 lb (80.7 kg), SpO2 98%.     02/22/2023    3:45 PM 09/21/2022    2:11 PM 07/13/2022   11:17 AM  Vitals with BMI  Height 5\' 8"  5\' 8"  5\' 8"   Weight 178 lbs 180 lbs 3 oz 178 lbs 3 oz  BMI 27.07 27.41 27.1  Systolic 108 114 409  Diastolic 65 64 73  Pulse 69 94 73    Orthostatic VS for the past 72 hrs (Last 3 readings):  Orthostatic BP Patient Position BP Location Cuff Size Orthostatic Pulse  02/22/23 1548 97/69 Standing Left Arm Normal 79  02/22/23 1547 101/63 Sitting Left Arm Normal 66  02/22/23 1546 104/59 Supine Left Arm Normal 59      Physical Exam Vitals reviewed.  Neck:     Vascular: No carotid bruit or JVD.  Cardiovascular:     Rate and Rhythm: Normal rate and regular rhythm.     Pulses: Intact distal pulses.          Radial pulses are 2+ on the right side and 2+ on the left  side.     Heart sounds: S1 normal and S2 normal. No murmur heard.    No gallop.  Pulmonary:     Effort: Pulmonary effort is normal.     Breath sounds: Normal breath sounds.  Abdominal:     General: Bowel sounds are normal.     Palpations: Abdomen is soft.  Musculoskeletal:     Right lower leg: Edema (2+ pitting below knee) present.     Left lower leg: Edema (2 + below knee pitting edema) present.   Laboratory examination:   Recent Labs    06/02/22 0417 07/13/22 1155  NA 137 143  K 4.3 5.0  CL 109 110*  CO2 23 21  GLUCOSE 115* 87  BUN 25* 19  CREATININE 1.41* 1.32*  CALCIUM 8.5* 8.7  GFRNONAA 60*  --       Latest Ref Rng & Units 07/13/2022   11:55 AM 06/02/2022    4:17 AM 08/06/2021    9:44 AM  CMP  Glucose 70 - 99 mg/dL 87  782  92   BUN 6 - 24 mg/dL 19  25  15     Creatinine 0.76 - 1.27 mg/dL 9.56  2.13  0.86   Sodium 134 - 144 mmol/L 143  137  140   Potassium 3.5 - 5.2 mmol/L 5.0  4.3  4.6   Chloride 96 - 106 mmol/L 110  109  105   CO2 20 - 29 mmol/L 21  23  22    Calcium 8.7 - 10.2 mg/dL 8.7  8.5  9.0   Total Protein 6.0 - 8.5 g/dL 5.5     Total Bilirubin 0.0 - 1.2 mg/dL 0.3     Alkaline Phos 44 - 121 IU/L 168     AST 0 - 40 IU/L 19     ALT 0 - 44 IU/L 20         Latest Ref Rng & Units 07/13/2022   11:55 AM 06/02/2022    4:17 AM 08/06/2021    9:44 AM  CBC  WBC 3.4 - 10.8 x10E3/uL 11.7  11.5  9.2   Hemoglobin 13.0 - 17.7 g/dL 57.8  46.9  62.9   Hematocrit 37.5 - 51.0 % 46.1  44.0  43.5   Platelets 150 - 450 x10E3/uL 234  215  241     Lipid Panel Recent Labs    07/13/22 1155  CHOL 81*  TRIG 41  LDLCALC 35  HDL 34*   HEMOGLOBIN A1C Lab Results  Component Value Date   HGBA1C 6.3 (H) 07/01/2021   MPG 134.11 07/01/2021   Lab Results  Component Value Date   TSH 17.400 (H) 07/13/2022   TSH 03/23/2022: Normal at 1.04.  External labs:  Labs 09/01/2021:  BUN 18, creatinine 1.40, EGFR 61 mL, potassium 4.2, ALT minimally elevated at 148, otherwise ALT and AST are normal.  Hb 14.6/HCT 44.0, platelets 217, normal indicis.  Allergies  No Known Allergies  Final Medications at End of Visit    Current Outpatient Medications:    BAYER LOW DOSE 81 MG chewable tablet, Chew 81 mg by mouth daily., Disp: , Rfl:    levothyroxine (SYNTHROID) 125 MCG tablet, Take 125 mcg by mouth daily before breakfast., Disp: , Rfl:    nitroGLYCERIN (NITROSTAT) 0.4 MG SL tablet, Place 1 tablet (0.4 mg total) under the tongue every 5 (five) minutes as needed for chest pain., Disp: 30 tablet, Rfl: 12   TABRECTA 200 MG tablet, Take 400 mg by mouth in  the morning and at bedtime., Disp: , Rfl:    Radiology:   DG Chest Portable 07/01/2021 The heart size and mediastinal contours are within normal limits. Both lungs are clear. The visualized skeletal structures are  unremarkable. Impression: No active disease  CT scan of the chest and abdomen 08/10/2022: 1. Slight interval decrease in size of the 10 mm right middle lobe pulmonary nodule, with similar interposed scarring in the right middle lobe. 2. Stable prominent/mildly enlarged bilateral iliac side chain lymph nodes and prominent mediastinal lymph nodes. 3. Emphysema  Cardiac Studies:   Lower Extremity Venous Duplex  10/31/2020: - There is no evidence of deep vein thrombosis in the lower extremity.  However, portions of this examination were limited- see technologist  comments above.  LEFT:  - There is no evidence of deep vein thrombosis in the lower extremity.  However, portions of this examination were limited- see technologist comments above.   Left Heart Catheterization 07/01/21:  LV: 141/21, EDP 36 mmHg.  Ao 150/95, mean 118 mmHg.  There was no pressure gradient across the aortic valve. LM: Large vessel. LAD: Thrombotic/ulcerated proximal LAD 80 to 90% stenosis with TIMI II flow.  Apical LAD diffusely diseased.  Direct stenting ONYX FRONTIER 3.5X30 mm DES. Maximum pressure: 16 atm. Inflation time: 45 sec. Stent strut is well apposed.  TIMI II to TIMI-3 flow at the end of the procedure, distal embolization of the apical LAD of no clinical consequence with occlusion.  It is also diffusely diseased at the apex. CX: Very large-caliber vessel.  Mid segment has a 40% stenosis. 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  There are contralateral collaterals noted and ipsilateral collaterals noted to the distal right coronary artery.  The artery is flush occluded after the RV branch, I did try to open up the vessel however unable to obtain access to the true lumen.  However with the attempted angioplasty, contralateral flow improved all the way back to the proximal segment of the RCA from the LAD. Small to moderate sized RCA and severely diffusely  diseased.   07/01/2021: ONYX FRONTIER 3.5X30 mm DES to proximal LAD  Echocardiogram 07/01/2021: 1. Left ventricular ejection fraction, by estimation, is 45 to 50%. The left ventricle has mildly decreased function. The left ventricle  demonstrates regional wall motion abnormalities, There is akinesis of the left  ventricular, apical inferoseptal wall and anteroseptal wall. Left ventricular diastolic parameters were normal.   2. Right ventricular systolic function is normal. The right ventricular size is normal.  3. The mitral valve is normal in structure. No evidence of mitral valve regurgitation. No evidence of mitral stenosis.  4. The aortic valve is normal in structure. Aortic valve regurgitation is not visualized. No aortic stenosis is present.  EKG:   EKG 09/21/2022: Normal sinus rhythm at the rate of 75 bpm, incomplete right bundle branch block.  Normal EKG.  No change from 07/13/2022.  Assessment     ICD-10-CM   1. Coronary artery disease of native artery of native heart with stable angina pectoris (HCC)  I25.118 CBC    CMP14+EGFR    2. Pure hypercholesterolemia  E78.00 Lipid Panel With LDL/HDL Ratio    3. Tobacco use disorder  F17.200     4. Abnormal TSH  R79.89 TSH+T4F+T3Free    Ambulatory referral to Endocrinology    5. Hypovitaminosis D  E55.9 VITAMIN D 25 Hydroxy (Vit-D Deficiency, Fractures)      Medications Discontinued During This Encounter  Medication Reason   atorvastatin (LIPITOR) 80 MG tablet    ezetimibe (ZETIA) 10 MG tablet     No orders of the defined types were placed in this encounter.  Recommendations:   Thaniel Orlan Rychlik is a 52 y.o. 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 Medical history significant for seminoma of the left testicle, stage IV metastatic lung cancer diagnosed in  September 2020 with excellent response to capmatinib.  1. Coronary artery disease of native artery of native heart with stable angina pectoris [facility]) Patient made an appointment to see me due to dizziness and ringing in his ears after he hit his ear with his hand accidentally, do not think his symptoms of tinnitus and dizziness are related to cardiac issues.  He has chronic leg edema and also generalized anasarca including some edema in his hands, related to chemotherapy.  Will obtain CMP to evaluate his LFTs and also serum albumin. - CBC - CMP14+EGFR  2. Pure hypercholesterolemia Patient has discontinued Lipitor and also Zetia about 4 months ago as he was having aches and pains.  I will repeat lipid profile testing, in view of markedly elevated TSH which has not been followed through, would not want to initiate therapy with statins for now. - Lipid Panel With LDL/HDL Ratio  3. Tobacco use disorder Unfortunately still smoking in spite of lung cancer and coronary artery disease.  I again tried to reinforce abstinence, patient now plans to discuss with his therapist about hypnotism to quit smoking which I encouraged him to do so.  4. Abnormal TSH Patient has markedly abnormal TSH, he needs endocrine consult, will obtain baseline thyroid function tests. - YQM+[id number]+[id number] - Ambulatory referral to Endocrinology  5. Hypovitaminosis D Will check vitamin D levels.  Office visit in 2 to 3 months for follow-up. - VITAMIN D 25 Hydroxy (Vit-D Deficiency, Fractures)    Yates Decamp, MD, [facility] 02/22/2023, 5:03 PM Office: [phone] Fax: [phone] Pager: [phone]

## 2023-02-24 MED ORDER — EZETIMIBE 10 MG PO TABS
10.0000 mg | ORAL_TABLET | Freq: Every day | ORAL | 3 refills | Status: DC
Start: 2023-02-24 — End: 2024-02-25

## 2023-02-24 MED ORDER — ATORVASTATIN CALCIUM 10 MG PO TABS: 10.0000 mg | ORAL_TABLET | Freq: Every day | ORAL | 1 refills | Status: AC

## 2023-02-24 MED ORDER — VITAMIN D (ERGOCALCIFEROL) 1.25 MG (50000 UNIT) PO CAPS
50000.0000 [IU] | ORAL_CAPSULE | ORAL | 0 refills | Status: AC
Start: 2023-02-24 — End: ?

## 2023-02-24 NOTE — Addendum Note (Signed)
Addended by: Delrae Rend on: 02/24/2023 08:44 AM   Modules accepted: Orders

## 2023-02-26 ENCOUNTER — Encounter (HOSPITAL_COMMUNITY): Payer: Self-pay

## 2023-02-26 ENCOUNTER — Ambulatory Visit (HOSPITAL_COMMUNITY): Payer: Medicare Other

## 2023-03-05 ENCOUNTER — Encounter (HOSPITAL_COMMUNITY): Payer: Self-pay

## 2023-03-05 ENCOUNTER — Ambulatory Visit (HOSPITAL_COMMUNITY)
Admission: RE | Admit: 2023-03-05 | Discharge: 2023-03-05 | Disposition: A | Discharge status: Home / Self Care | Payer: Medicare Other | Source: Ambulatory Visit | Attending: Internal Medicine | Admitting: Internal Medicine

## 2023-03-05 DIAGNOSIS — C7931 Secondary malignant neoplasm of brain: Secondary | ICD-10-CM | POA: Insufficient documentation

## 2023-03-05 DIAGNOSIS — C3490 Malignant neoplasm of unspecified part of unspecified bronchus or lung: Secondary | ICD-10-CM | POA: Diagnosis present

## 2023-03-05 MED ORDER — IOHEXOL 300 MG/ML  SOLN
100.0000 mL | Freq: Once | INTRAMUSCULAR | Status: AC | PRN
  Administered 2023-03-05: 75 mL via INTRAVENOUS

## 2023-03-12 ENCOUNTER — Encounter: Payer: Self-pay | Admitting: Cardiology

## 2023-03-12 DIAGNOSIS — E039 Hypothyroidism, unspecified: Secondary | ICD-10-CM

## 2023-03-12 DIAGNOSIS — E559 Vitamin D deficiency, unspecified: Secondary | ICD-10-CM

## 2023-03-12 NOTE — Telephone Encounter (Signed)
He is looking for a PCP. Can you guide him please

## 2023-03-12 NOTE — Telephone Encounter (Signed)
From patient.

## 2023-03-17 NOTE — Telephone Encounter (Signed)
ICD-10-CM   1. Hypovitaminosis D  E55.9 Ambulatory referral to Endocrinology    2. Acquired hypothyroidism  E03.9 Ambulatory referral to Endocrinology     Orders Placed This Encounter  Procedures   Ambulatory referral to Endocrinology    Referral Priority:   Routine    Referral Type:   Consultation    Referral Reason:   Specialty Services Required    Number of Visits Requested:   1    No orders of the defined types were placed in this encounter.

## 2023-04-07 MED ORDER — FAMOTIDINE 20 MG PO TABS
20 | ORAL_TABLET | Freq: Every day | ORAL | 1 refills | Status: DC
Start: 2023-04-07 — End: 2023-06-21

## 2023-04-09 ENCOUNTER — Encounter

## 2023-04-09 NOTE — Telephone Encounter (Signed)
 The referring provider called to see when the pt would be scheduled.  The please contact the pt to schedule.

## 2023-05-03 ENCOUNTER — Encounter

## 2023-05-04 ENCOUNTER — Ambulatory Visit: Payer: MEDICAID | Primary: Family Medicine

## 2023-05-04 DIAGNOSIS — M25819 Other specified joint disorders, unspecified shoulder: Secondary | ICD-10-CM

## 2023-05-04 NOTE — Other (Signed)
There is some inflammatory change in the tendons of the shoulder though there does not appear to be any tears.  There is some labral degeneration as well and radiology states that they cannot exclude a tear in this though I think that this is probably low likelihood based on prior exam.  Overall this appears to be largely degenerative without any surgical issues.  Usually treatment for this sort of issue is physical therapy however patient I believe is already tried this.  If he would like to see orthopedics we can probably get this arranged.

## 2023-05-06 ENCOUNTER — Encounter

## 2023-05-06 ENCOUNTER — Ambulatory Visit
Admit: 2023-05-06 | Discharge: 2023-05-06 | Payer: MEDICAID | Attending: Critical Care Medicine | Primary: Family Medicine

## 2023-05-06 DIAGNOSIS — I2693 Single subsegmental pulmonary embolism without acute cor pulmonale: Secondary | ICD-10-CM

## 2023-05-06 NOTE — Progress Notes (Signed)
Pulmonary and Sleep Medicine    LOCHIE PEVELER (DOB:  August 29, 1970) is a 52 y.o. male,New patient, here for evaluation of the following chief complaint(s):  New Patient (Pt advised here for breathing issues, had blood clots in march, since that time he has been soa)      Referring physician:  Daisey Must, Aprn  [address]  [place],  [place] [address]     ASSESSMENT/PLAN:  1. Single subsegmental pulmonary embolism without acute cor pulmonale (HCC)  -     CTA PULMONARY W CONTRAST; Future  2. Gastroesophageal reflux disease, unspecified whether esophagitis present  3. Cigarette nicotine dependence without complication  4. History of deep vein thrombosis  -     CTA PULMONARY W CONTRAST; Future  5. Shortness of breath  -     Exercise stress test; Future  -     Echo (TTE) complete (PRN contrast/bubble/strain/3D); Future  -     CTA PULMONARY W CONTRAST; Future  6. Wheezing  -     Full PFT Study With Bronchodilator; Future        Shortness of breath etiology is not clear but likely related to cardiac and pulmonary factors.  Will repeat echocardiogram.  His right-sided pressures were normal at the time of his PE.  Will also obtain cardiac stress test.  Pulmonary function testing since I suspect he probably has an element of COPD.  Continue with the inhalers we discussed the proper use of the Advair inhaler.  Albuterol as needed.  Follow-up after the above.       Nelta Caudill Abul-Khoudoud, MD, FCCP, DABSM    Return in about 4 weeks (around 06/03/2023).    SUBJECTIVE/OBJECTIVE:        He is here for evaluation of shortness of breath the patient is a chronic daily smoker.  He has a history of DVT following trauma to his lower extremity at least on 2 separate occasions the first 1 was in 2000 and then in 2018.  He presented to the emergency room in March of this year.  He had a CT of the chest done that showed bilateral pulmonary emboli.  He also had a chronic DVT.  He was started on Eliquis at that time.  He had an  echocardiogram that showed normal cardiac physiology.  The patient says that since then he has been more short of breath.  He endorses wheezing.  He does use an albuterol inhaler.  He also is on Advair.  I was asked to see him regarding the above.          Continue the following medications as reported by the patient:    Prior to Visit Medications    Medication Sig Taking? Authorizing Provider   famotidine (PEPCID) 20 MG tablet TAKE 1 TABLET BY MOUTH DAILY WITH SUPPER Yes Eliezer Bottom, MD   simvastatin (ZOCOR) 40 MG tablet Take 1 tablet by mouth nightly Yes Eliezer Bottom, MD   apixaban (ELIQUIS) 5 MG TABS tablet Take 1 tablet by mouth 2 times daily Yes Eliezer Bottom, MD   albuterol sulfate HFA (VENTOLIN HFA) 108 (90 Base) MCG/ACT inhaler Inhale 2 puffs into the lungs every 6 hours as needed for Wheezing Yes Eliezer Bottom, MD   fluticasone-salmeterol (ADVAIR DISKUS) 100-50 MCG/ACT AEPB diskus inhaler INHALE 1PUFF INTO THE LUNGS EVERY MORNING AND 1 PUFF EVERY EVENING Yes Eliezer Bottom, MD   montelukast (SINGULAIR) 10 MG tablet Take 1 tablet by mouth daily Yes Eliezer Bottom, MD  Glucosamine-Chondroitin (GLUCOSAMINE CHONDR COMPLEX PO) Take by mouth in the morning and at bedtime Yes [provider]   acetaminophen (TYLENOL) 325 MG tablet Take 2 tablets by mouth every 6 hours as needed for Pain Yes [provider]   varenicline (CHANTIX) 0.5 MG tablet Take 1-2 tablets by mouth See Admin Instructions 0.5mg  DAILY for 3 days followed by 0.5mg  TWICE DAILY for 4 days followed by 1mg  TWICE DAILY  Patient not taking: Reported on 05/06/2023  Eliezer Bottom, MD        Review of Systems   Constitutional:  Negative for activity change, appetite change, chills, diaphoresis and fatigue.   HENT:  Negative for congestion, dental problem, drooling, ear discharge, postnasal drip and rhinorrhea.    Eyes:  Negative for visual disturbance.   Respiratory:  Negative for apnea, cough, chest tightness, shortness of breath and  wheezing.    Gastrointestinal:  Negative for abdominal distention, abdominal pain and anal bleeding.   Endocrine: Negative for cold intolerance, heat intolerance and polydipsia.   Genitourinary:  Negative for difficulty urinating, dysuria, enuresis and flank pain.   Musculoskeletal:  Negative for arthralgias, back pain and gait problem.   Allergic/Immunologic: Negative for environmental allergies.   Neurological:  Negative for dizziness, facial asymmetry, light-headedness and headaches.       Vitals:    05/06/23 1043   BP: 132/81   Site: Left Upper Arm   Pulse: 77   Resp: 20   SpO2: 100%   Weight: 96.2 kg (212 lb)   Height: 1.854 m (6\' 1" )      BMI Readings from Last 1 Encounters:   05/06/23 27.97 kg/m         Physical Exam  Vitals reviewed.   Constitutional:       Appearance: Normal appearance.   HENT:      Head: Normocephalic and atraumatic.      Nose: Nose normal.   Eyes:      Extraocular Movements: Extraocular movements intact.      Conjunctiva/sclera: Conjunctivae normal.   Cardiovascular:      Rate and Rhythm: Normal rate and regular rhythm.      Heart sounds: No murmur heard.     No friction rub.   Pulmonary:      Effort: Pulmonary effort is normal. No respiratory distress.      Breath sounds: No stridor. Wheezing present. No rhonchi or rales.   Abdominal:      General: There is no distension.      Palpations: There is no mass.      Tenderness: There is no abdominal tenderness. There is no guarding or rebound.   Musculoskeletal:      Cervical back: Normal range of motion and neck supple.   Neurological:      Mental Status: He is alert and oriented to person, place, and time.             This note was generated using a voice recognition software. Errors in voice recognition may have occurred.      An electronic signature was used to authenticate this note.    --Bjorn Pippin, MD

## 2023-05-07 ENCOUNTER — Encounter

## 2023-05-07 NOTE — Telephone Encounter (Signed)
Gave patient 1 sample of Trelegy    Lot: [id number]  Exp: 10/2024

## 2023-05-09 MED ORDER — TRELEGY ELLIPTA 100-62.5-25 MCG/ACT IN AEPB
Freq: Every day | RESPIRATORY_TRACT | 0 refills | Status: DC
Start: 2023-05-09 — End: 2023-05-24

## 2023-05-24 ENCOUNTER — Encounter

## 2023-05-24 MED ORDER — TRELEGY ELLIPTA 100-62.5-25 MCG/ACT IN AEPB
100-62.5-25 MCG/ACT | Freq: Every day | RESPIRATORY_TRACT | 0 refills | Status: AC
Start: 2023-05-24 — End: 2023-05-31

## 2023-05-24 NOTE — Telephone Encounter (Signed)
Gave the patient 2 samples of Trelegy    Lot: [id number]  Exp: 10/2024

## 2023-05-26 ENCOUNTER — Ambulatory Visit: Payer: Medicare Other | Admitting: Cardiology

## 2023-05-31 ENCOUNTER — Encounter

## 2023-05-31 ENCOUNTER — Ambulatory Visit: Admit: 2023-05-31 | Discharge: 2023-05-31 | Payer: MEDICAID | Attending: Family Medicine | Primary: Family Medicine

## 2023-05-31 DIAGNOSIS — Z23 Encounter for immunization: Secondary | ICD-10-CM

## 2023-05-31 MED ORDER — TRELEGY ELLIPTA 100-62.5-25 MCG/ACT IN AEPB
100-62.5-25 MCG/ACT | Freq: Every day | RESPIRATORY_TRACT | 3 refills | Status: DC
Start: 2023-05-31 — End: 2023-08-17

## 2023-05-31 NOTE — Progress Notes (Unsigned)
Danny Ruiz (DOB:  03-Jul-1971) is a 52 y.o. male,Established patient, here for evaluation of the following chief complaint(s):  Follow-up (Pt here for 4 month f/u ), Results (Pt would like to discuss MRI results ), and Discuss Medications (Singlular doesn't seem to help. Femotodine needs to have a dosage increase, he takes 2 at night and some throughout the day if he has issues. /Pulmonologist gave him sample of Trelegy, he would like this to be filled.)      Assessment & Plan   ASSESSMENT/PLAN:  1. Multiple subsegmental pulmonary emboli without acute cor pulmonale (HCC)  The following orders have not been finalized:  -     apixaban (ELIQUIS) 5 MG TABS tablet      No follow-ups on file.         Subjective   SUBJECTIVE/OBJECTIVE:  Mri results    Pulm gave treligy  Works well  Pft 11/6      Reflux  Famotidine  2 night 40mg     Singulair  Taking comnsistently  Been on for months    Did not use chnatix as prescibed          Review of Systems       Objective   Physical Exam       Vitals:    05/31/23 1353   BP: 120/82   Pulse: 74   Resp: 18   Temp: 99.3 F (37.4 C)   SpO2: 98%        Current Outpatient Medications   Medication Sig Dispense Refill    fluticasone-umeclidin-vilant (TRELEGY ELLIPTA) 100-62.5-25 MCG/ACT AEPB inhaler Inhale 1 puff into the lungs daily 2 each 0    famotidine (PEPCID) 20 MG tablet TAKE 1 TABLET BY MOUTH DAILY WITH SUPPER 90 tablet 1    simvastatin (ZOCOR) 40 MG tablet Take 1 tablet by mouth nightly 90 tablet 1    apixaban (ELIQUIS) 5 MG TABS tablet Take 1 tablet by mouth 2 times daily 60 tablet 5    montelukast (SINGULAIR) 10 MG tablet Take 1 tablet by mouth daily 90 tablet 1    Glucosamine-Chondroitin (GLUCOSAMINE CHONDR COMPLEX PO) Take by mouth in the morning and at bedtime      acetaminophen (TYLENOL) 325 MG tablet Take 2 tablets by mouth every 6 hours as needed for Pain      albuterol sulfate HFA (VENTOLIN HFA) 108 (90 Base) MCG/ACT inhaler Inhale 2 puffs into the lungs every 6 hours  as needed for Wheezing (Patient not taking: Reported on 05/31/2023) 18 g 3    fluticasone-salmeterol (ADVAIR DISKUS) 100-50 MCG/ACT AEPB diskus inhaler INHALE 1PUFF INTO THE LUNGS EVERY MORNING AND 1 PUFF EVERY EVENING (Patient not taking: Reported on 05/31/2023) 60 each 3    varenicline (CHANTIX) 0.5 MG tablet Take 1-2 tablets by mouth See Admin Instructions 0.5mg  DAILY for 3 days followed by 0.5mg  TWICE DAILY for 4 days followed by 1mg  TWICE DAILY (Patient not taking: Reported on 05/06/2023) 57 tablet 0     No current facility-administered medications for this visit.      Family History   Problem Relation Age of Onset    No Known Problems Mother     No Known Problems Father     No Known Problems Brother     No Known Problems Half-Sister     No Known Problems Half-Sister     No Known Problems Daughter     No Known Problems Daughter     No Known Problems Son  Past Medical History:   Diagnosis Date    Asthma     DVT (deep venous thrombosis) (HCC)     had on two occasions in the past    GERD (gastroesophageal reflux disease)     Pulmonary embolism (HCC) 10/01/2022    Tobacco abuse       Past Surgical History:   Procedure Laterality Date    KIDNEY REMOVAL Left     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"      Allergies   Allergen Reactions    Nsaids         Lab Results   Component Value Date    NA 141 02/15/2023    K 4.8 02/15/2023    CL 102 02/15/2023    CO2 32 (H) 02/15/2023    BUN 10 02/15/2023    CREATININE 1.3 (H) 02/15/2023    GLUCOSE 100 02/15/2023    CALCIUM 8.9 02/15/2023    BILITOT 0.5 02/15/2023    ALKPHOS 98 02/15/2023    AST 29 02/15/2023    ALT 24 02/15/2023    LABGLOM 66 02/15/2023    GLOB 2.7 11/05/2022        Lab Results   Component Value Date    WBC 7.17 02/15/2023    HGB 15.5 02/15/2023    HCT 45.0 02/15/2023    MCV 86.7 02/15/2023    PLT 304 02/15/2023            {Time Documentation Optional:[id number]}    EMR Dragon/transcription disclaimer:  Much of this encounter note is electronic  transcription/translation of spoken language toprinted texts.  The electronic translation of spoken language may be erroneous, or at times, nonsensical words or phrases may be inadvertently transcribed.  Although I have reviewed the note for such errors, some may stillexist.      An electronic signature was used to authenticate this note.    --Eliezer Bottom, MD

## 2023-06-01 MED ORDER — VARENICLINE TARTRATE 0.5 MG PO TABS
0.5 MG | ORAL_TABLET | ORAL | 0 refills | Status: DC
Start: 2023-06-01 — End: 2023-08-17

## 2023-06-01 MED ORDER — ALBUTEROL SULFATE HFA 108 (90 BASE) MCG/ACT IN AERS
10890 (90 Base) MCG/ACT | Freq: Four times a day (QID) | RESPIRATORY_TRACT | 3 refills | Status: DC | PRN
Start: 2023-06-01 — End: 2023-09-10

## 2023-06-01 MED ORDER — VARENICLINE TARTRATE 1 MG PO TABS
1 MG | ORAL_TABLET | Freq: Two times a day (BID) | ORAL | 5 refills | Status: DC
Start: 2023-06-01 — End: 2023-09-10

## 2023-06-01 MED ORDER — APIXABAN 5 MG PO TABS
5 MG | ORAL_TABLET | Freq: Two times a day (BID) | ORAL | 5 refills | Status: DC
Start: 2023-06-01 — End: 2023-09-10

## 2023-06-01 MED ORDER — SIMVASTATIN 40 MG PO TABS
40 MG | ORAL_TABLET | Freq: Every evening | ORAL | 1 refills | Status: DC
Start: 2023-06-01 — End: 2023-09-10

## 2023-06-01 MED ORDER — FAMOTIDINE 40 MG PO TABS
40 MG | ORAL_TABLET | Freq: Every evening | ORAL | 3 refills | Status: DC
Start: 2023-06-01 — End: 2023-09-10

## 2023-06-02 ENCOUNTER — Inpatient Hospital Stay: Admit: 2023-06-02 | Payer: MEDICAID | Attending: Critical Care Medicine | Primary: Family Medicine

## 2023-06-02 ENCOUNTER — Inpatient Hospital Stay
Admit: 2023-06-02 | Discharge: 2023-06-09 | Payer: MEDICAID | Attending: Critical Care Medicine | Primary: Family Medicine

## 2023-06-02 ENCOUNTER — Inpatient Hospital Stay
Admit: 2023-06-02 | Discharge: 2023-06-10 | Payer: MEDICAID | Attending: Critical Care Medicine | Primary: Family Medicine

## 2023-06-02 ENCOUNTER — Inpatient Hospital Stay
Admit: 2023-06-02 | Discharge: 2023-06-02 | Payer: MEDICAID | Attending: Critical Care Medicine | Primary: Family Medicine

## 2023-06-02 VITALS — BP 127/74 | Ht 73.0 in | Wt 215.0 lb

## 2023-06-02 DIAGNOSIS — I2693 Single subsegmental pulmonary embolism without acute cor pulmonale: Secondary | ICD-10-CM

## 2023-06-02 DIAGNOSIS — J449 Chronic obstructive pulmonary disease, unspecified: Secondary | ICD-10-CM

## 2023-06-02 DIAGNOSIS — R0602 Shortness of breath: Secondary | ICD-10-CM

## 2023-06-02 DIAGNOSIS — R062 Wheezing: Secondary | ICD-10-CM

## 2023-06-02 MED ORDER — ALBUTEROL SULFATE (2.5 MG/3ML) 0.083% IN NEBU
Freq: Two times a day (BID) | RESPIRATORY_TRACT | Status: AC | PRN
Start: 2023-06-02 — End: 2023-06-04
  Administered 2023-06-02: 17:00:00 2.5 mg via RESPIRATORY_TRACT

## 2023-06-02 MED ORDER — IOPAMIDOL 76 % IV SOLN
76 | Freq: Once | INTRAVENOUS | Status: AC | PRN
Start: 2023-06-02 — End: 2023-06-02
  Administered 2023-06-02: 16:00:00 70 mL via INTRAVENOUS

## 2023-06-02 NOTE — Procedures (Signed)
Media Information          Pulmonary Function Study    Interpretation:    The FVC is Normal. FEV1 is Normal. FEV1/FVC ratio is reduced. After bronchodilator therapy there was no significant change in FEV1. Total lung capacity is Normal. Residual volume is increased.      Diffusing lung capacity when corrected for alveolar volume is Normal.         Impression:    Mild chronic obstructive pulmonary disease.   Hyperinflation persent.         Herta Hink Abul-Khoudoud, MD, FCCP, DABSM

## 2023-06-02 NOTE — Addendum Note (Signed)
Encounter addended by: Bjorn Pippin, MD on: 06/05/2023 12:52 PM   Actions taken: Clinical Note Signed, Charge Capture section accepted

## 2023-06-02 NOTE — Telephone Encounter (Signed)
Patient is scheduled today for Echo and his insurance is requesting a peer to peer review for approval. Please contact [employer] at [phone] with tracking # [id number] to complete the review, thank you.

## 2023-06-15 ENCOUNTER — Ambulatory Visit: Payer: Medicare Other | Attending: Cardiology | Admitting: Cardiology

## 2023-06-15 NOTE — Progress Notes (Deleted)
Cardiology Office Note:  .   Date:  06/15/2023  ID:  Jacob [name], DOB 1971/07/27, MRN [id number] PCP: Patient, No Pcp Per  [facility] Providers Cardiologist:  None { Click to update primary MD,subspecialty MD or APP then REFRESH:1}  No chief complaint on file.     History of Present Illness: .   Jacob [name] is a 52 y.o. 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   Discussed the use of AI scribe software for clinical note transcription with the patient, who gave verbal consent to proceed.  History of Present Illness            ROS  Labs   Lab Results  Component Value Date   CHOL 171 02/23/2023   HDL 34 (L) 02/23/2023   LDLCALC 123 (H) 02/23/2023   TRIG 74 02/23/2023   CHOLHDL 4.6 07/01/2021   Lab Results  Component Value Date   NA 140 02/23/2023   K 5.0 02/23/2023   CO2 24 02/23/2023   GLUCOSE 100 (H) 02/23/2023   BUN 16 02/23/2023   CREATININE 1.27 02/23/2023   CALCIUM 8.6 (L) 02/23/2023   EGFR 68 02/23/2023   GFRNONAA 60 (L) 06/02/2022      Latest Ref Rng & Units 02/23/2023   10:34 AM 07/13/2022   11:55 AM 06/02/2022    4:17 AM  BMP  Glucose 70 - 99 mg/dL 865  87  784   BUN 6 - 24 mg/dL 16  19  25    Creatinine 0.76 - 1.27 mg/dL 6.96  2.95  2.84   BUN/Creat Ratio 9 - 20 13  14     Sodium 134 - 144 mmol/L 140  143  137   Potassium 3.5 - 5.2 mmol/L 5.0  5.0  4.3   Chloride 96 - 106 mmol/L 108  110  109   CO2 20 - 29 mmol/L 24  21  23    Calcium 8.7 - 10.2 mg/dL 8.6  8.7  8.5        Latest Ref Rng & Units 02/23/2023   10:34 AM 07/13/2022   11:55 AM 06/02/2022    4:17 AM  CBC  WBC 3.4 - 10.8 x10E3/uL  9.2  11.7  11.5   Hemoglobin 13.0 - 17.7 g/dL 13.2  44.0  10.2   Hematocrit 37.5 - 51.0 % 43.5  46.1  44.0   Platelets 150 - 450 x10E3/uL 213  234  215    Last vitamin D Lab Results  Component Value Date   VD25OH 15.8 (L) 02/23/2023    External Labs:  Component 05/06/23 09/03/22 03/23/22 06/02/21  TSH 42.651 High  69.075 High  11.594 High  50.956 High    Component 05/06/23 09/03/22 03/23/22 06/02/21  T4, Free 1.13 0.61 1.04 0.92   Labs 05/06/2023:  Sodium 138, potassium 4.5, BUN 21, creatinine 1.20, EGFR 72 mL, LFTs normal.  Hb 15.4/HCT 46.0, platelets 203, normal indicis.   Physical Exam:   VS:  There were no vitals taken for this visit.   Wt Readings from Last 3 Encounters:  02/22/23 178 lb (80.7 kg)  09/21/22 180 lb 3.2 oz (81.7 kg)  07/13/22 178 lb 3.2 oz (80.8 kg)     Physical Exam  Studies Reviewed: Marland Kitchen    Left Heart Catheterization 07/01/21:  LV: 141/21, EDP 36 mmHg.  Ao 150/95, mean 118 mmHg.  There was no pressure gradient across the aortic valve. LM: Large vessel. LAD: Thrombotic/ulcerated proximal LAD 80 to 90% stenosis with TIMI II flow.  Apical LAD diffusely diseased.  Direct stenting ONYX FRONTIER 3.5X30 mm DES. Maximum pressure: 16 atm. Inflation time: 45 sec. Stent strut is well apposed.  TIMI II to TIMI-3 flow at the end of the procedure, distal embolization of the apical LAD of no clinical consequence with occlusion.  It is also diffusely diseased at the apex. CX: Very large-caliber vessel.  Mid segment has a 40% stenosis. 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  There are contralateral collaterals noted and ipsilateral collaterals noted to the distal right coronary artery.  The artery is flush occluded after the RV branch, I did try to open up the vessel however unable to obtain access to the true lumen.  However with the attempted angioplasty, contralateral flow improved all the way back to the proximal segment of the  RCA from the LAD. Small to moderate sized RCA and severely diffusely diseased.      Echocardiogram 07/01/2021: 1. Left ventricular ejection fraction, by estimation, is 45 to 50%. The left ventricle has mildly decreased function. The left ventricle  demonstrates regional wall motion abnormalities, There is akinesis of the left  ventricular, apical inferoseptal wall and anteroseptal wall. Left ventricular diastolic parameters were normal.   2. Right ventricular systolic function is normal. The right ventricular size is normal.  3. The mitral valve is normal in structure. No evidence of mitral valve regurgitation. No evidence of mitral stenosis.  4. The aortic valve is normal in structure. Aortic valve regurgitation is not visualized. No aortic stenosis is present.  EKG:         EKG 09/21/2022: Normal sinus rhythm at the rate of 75 bpm, incomplete right bundle branch block. Normal EKG. No change from 07/13/2022.   Medications and allergies    No Known Allergies  No outpatient medications have been marked as taking for the 06/15/23 encounter (Appointment) with Yates Decamp, MD.     ASSESSMENT AND PLAN: .    No diagnosis found.  There are no diagnoses linked to this encounter.  Assessment and Plan                {Are you ordering a CV Procedure (e.g. stress test, cath, DCCV, TEE, etc)?   Press F2        :[id number]}   Signed,  Yates Decamp, MD, [facility] 06/15/2023, 5:12 AM [facility] [address] [place], [place] [address] Phone: [phone]. Fax:  [phone]

## 2023-06-21 ENCOUNTER — Encounter: Admit: 2023-06-21 | Payer: MEDICAID | Admitting: Critical Care Medicine | Primary: Family Medicine

## 2023-06-21 VITALS — BP 130/82 | HR 68 | Temp 97.60000°F | Resp 18 | Ht 73.0 in | Wt 216.0 lb

## 2023-06-21 DIAGNOSIS — R0602 Shortness of breath: Principal | ICD-10-CM

## 2023-06-21 MED ORDER — FLUTICASONE FUROATE-VILANTEROL 100-25 MCG/ACT IN AEPB
100-25 MCG/ACT | Freq: Every day | RESPIRATORY_TRACT | 11 refills | Status: DC
Start: 2023-06-21 — End: 2023-09-10

## 2023-06-21 NOTE — Progress Notes (Signed)
 Pulmonary and Sleep Medicine    Danny Ruiz (DOB:  1970-08-01) is a 52 y.o. male,Established patient, here for evaluation of the following chief complaint(s):  Follow-up (6 week follow up/PFT-11/06/ECHO-11/06/STRESS TEST-11/06/CT- 11/06//Pt states

## 2023-07-05 ENCOUNTER — Encounter: Payer: MEDICAID | Attending: Physician Assistant | Primary: Family Medicine

## 2023-07-05 NOTE — Progress Notes (Deleted)
 Orthopaedic Clinic Note    NAME:  Danny Ruiz   DOB: April 06, 1971  MRN: [id number]      07/05/2023     CHIEF COMPLAINT:  Left shoulder pain      HISTORY OF PRESENT ILLNESS:   Danny Ruiz is a 52 y.o. male who presents to the office for evaluation and treatment of

## 2023-08-03 ENCOUNTER — Emergency Department (HOSPITAL_COMMUNITY): Payer: Medicare Other

## 2023-08-03 ENCOUNTER — Emergency Department (HOSPITAL_COMMUNITY)
Admission: EM | Admit: 2023-08-03 | Discharge: 2023-08-03 | Disposition: A | Discharge status: Home / Self Care | Payer: Medicare Other | Attending: Emergency Medicine | Admitting: Emergency Medicine

## 2023-08-03 DIAGNOSIS — I6782 Cerebral ischemia: Secondary | ICD-10-CM | POA: Diagnosis not present

## 2023-08-03 DIAGNOSIS — Z8673 Personal history of transient ischemic attack (TIA), and cerebral infarction without residual deficits: Secondary | ICD-10-CM | POA: Diagnosis not present

## 2023-08-03 DIAGNOSIS — R079 Chest pain, unspecified: Secondary | ICD-10-CM | POA: Diagnosis not present

## 2023-08-03 DIAGNOSIS — Z79899 Other long term (current) drug therapy: Secondary | ICD-10-CM | POA: Diagnosis not present

## 2023-08-03 DIAGNOSIS — I252 Old myocardial infarction: Secondary | ICD-10-CM | POA: Insufficient documentation

## 2023-08-03 DIAGNOSIS — R202 Paresthesia of skin: Secondary | ICD-10-CM | POA: Insufficient documentation

## 2023-08-03 DIAGNOSIS — R42 Dizziness and giddiness: Secondary | ICD-10-CM | POA: Insufficient documentation

## 2023-08-03 DIAGNOSIS — I2510 Atherosclerotic heart disease of native coronary artery without angina pectoris: Secondary | ICD-10-CM | POA: Insufficient documentation

## 2023-08-03 DIAGNOSIS — Z8547 Personal history of malignant neoplasm of testis: Secondary | ICD-10-CM | POA: Diagnosis not present

## 2023-08-03 LAB — CBC
HCT: 47 % (ref 39.0–52.0)
Hemoglobin: 15.4 g/dL (ref 13.0–17.0)
MCH: 30.7 pg (ref 26.0–34.0)
MCHC: 32.8 g/dL (ref 30.0–36.0)
MCV: 93.6 fL (ref 80.0–100.0)
Platelets: 249 10*3/uL (ref 150–400)
RBC: 5.02 MIL/uL (ref 4.22–5.81)
RDW: 13.9 % (ref 11.5–15.5)
WBC: 9.9 10*3/uL (ref 4.0–10.5)
nRBC: 0 % (ref 0.0–0.2)

## 2023-08-03 LAB — URINALYSIS, ROUTINE W REFLEX MICROSCOPIC
Bilirubin Urine: NEGATIVE
Glucose, UA: NEGATIVE mg/dL
Hgb urine dipstick: NEGATIVE
Ketones, ur: NEGATIVE mg/dL
Leukocytes,Ua: NEGATIVE
Nitrite: NEGATIVE
Protein, ur: NEGATIVE mg/dL
Specific Gravity, Urine: 1.014 (ref 1.005–1.030)
pH: 5 (ref 5.0–8.0)

## 2023-08-03 LAB — RAPID URINE DRUG SCREEN, HOSP PERFORMED
Amphetamines: NOT DETECTED
Barbiturates: NOT DETECTED
Benzodiazepines: NOT DETECTED
Cocaine: NOT DETECTED
Opiates: NOT DETECTED
Tetrahydrocannabinol: NOT DETECTED

## 2023-08-03 LAB — BASIC METABOLIC PANEL
Anion gap: 9 (ref 5–15)
BUN: 19 mg/dL (ref 6–20)
CO2: 24 mmol/L (ref 22–32)
Calcium: 9.2 mg/dL (ref 8.9–10.3)
Chloride: 102 mmol/L (ref 98–111)
Creatinine, Ser: 1.37 mg/dL — ABNORMAL HIGH (ref 0.61–1.24)
GFR, Estimated: >60 mL/min (ref >60)
Glucose, Bld: 95 mg/dL (ref 70–99)
Potassium: 4.7 mmol/L (ref 3.5–5.1)
Sodium: 135 mmol/L (ref 135–145)

## 2023-08-03 LAB — TROPONIN I (HIGH SENSITIVITY)
Troponin I (High Sensitivity): 3 ng/L (ref <18)
Troponin I (High Sensitivity): 4 ng/L (ref <18)

## 2023-08-03 MED ORDER — GADOBUTROL 1 MMOL/ML IV SOLN
8.0000 mL | Freq: Once | INTRAVENOUS | Status: AC | PRN
  Administered 2023-08-03: 8 mL via INTRAVENOUS

## 2023-08-03 MED ORDER — ASPIRIN 81 MG PO CHEW
324.0000 mg | CHEWABLE_TABLET | Freq: Once | ORAL | Status: AC
  Administered 2023-08-03: 324 mg via ORAL
  Filled 2023-08-03: qty 4

## 2023-08-03 NOTE — ED Notes (Signed)
 Patient informed NT and this RN he was to leave. This RN performed intentional rounding. Patient updated on pending labs, and plan of care. He was informed provider will be notified of his request for d/c.

## 2023-08-03 NOTE — Discharge Instructions (Signed)
Contact a health care provider if you: ?Have paresthesia that gets worse or does not go away. ?Have numbness after an injury. ?Have a burning or prickling feeling that gets worse when you walk. ?Have pain, cramps, or dizziness, or you faint. ?Develop a rash. ?Get help right away if you: ?Feel muscle weakness. ?Develop new weakness in an arm or leg. ?Have trouble walking or moving. ?Have problems with speech, understanding, or vision. ?Feel confused. ?Cannot control your bladder or bowel movements. ?These symptoms may be an emergency. Get help right away. Call 911. ?Do not wait to see if the symptoms will go away. ?Do not drive yourself to the hospital. ?

## 2023-08-03 NOTE — ED Triage Notes (Signed)
 Pt complains of bilateral arm weakness, chest pain and neck pain x 1 hour. Pt states 2 years ago had MI and had these same symptoms.

## 2023-08-03 NOTE — ED Provider Triage Note (Signed)
 Emergency Medicine Provider Triage Evaluation Note  Jacob [name] , a 53 y.o. male  was evaluated in triage.  Pt complains of pain, or more accurately bilateral arm heaviness he equates with prior episode of ACS.  Patient had with a maker lesion last year according to him.  He has been compliant with medication was well until today when he developed this.  Review of Systems  Positive: Heaviness Negative: Syncope  Physical Exam  BP (!) 147/104 (BP Location: Right Arm)   Pulse 82   Temp 97.8 F (36.6 C) (Oral)   Resp 16   SpO2 100%  Gen:   Awake, no distress speaking clearly Resp:  Normal effort no increased work of breathing MSK:   Moves extremities without difficulty no deformity  Medical Decision Making  Medically screening exam initiated at 3:17 PM.  Appropriate orders placed.  Jacob [name] was informed that the remainder of the evaluation will be completed by another provider, this initial triage assessment does not replace that evaluation, and the importance of remaining in the ED until their evaluation is complete.   Garrick Charleston, MD 08/03/23 [phone]

## 2023-08-03 NOTE — ED Provider Notes (Signed)
  EMERGENCY DEPARTMENT AT [facility] Provider Note   CSN: [id number] Arrival date & time: 08/03/23  1447     History  Chief Complaint  Patient presents with   Dizziness   Chest Pain    Jacob [name] is a 53 y.o. male who presents emergency department with chief complaint of bilateral upper extremity numbness dizziness and facial paresthesia.  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  Patient reports sudden onset of facial flushing, bilateral upper extremity tingling.  He became anxious and states that this is similar to symptoms he had when he had his MI however at that time he also had heaviness in both of his upper extremities and pain in his neck and jaw.  Patient states that he laid down for about 3 an hour however symptoms did not improve.  When he got up both of his arms felt heavy.  He tried to get in touch with his cardiologist, Dr. Ladona but was unable to get a call back so decided to drive himself to the hospital.  Currently he continues to have bilateral upper extremity numbness and tingling which she describes as burning.  And paresthesia of the face only on the right side.  He denies chest pain or shortness of breath.   Dizziness Associated symptoms: chest pain   Chest Pain Associated symptoms: dizziness        Home Medications Prior to Admission medications   Medication Sig Start Date End Date Taking? Authorizing Provider  atorvastatin  (LIPITOR ) 10 MG tablet Take 1 tablet (10 mg total) by mouth daily. 02/24/23 05/25/23  Ladona Heinz, MD  BAYER LOW DOSE 81 MG chewable tablet Chew 81 mg by mouth daily.    [provider]  ezetimibe  (ZETIA ) 10 MG tablet Take 1 tablet (10 mg total) by mouth daily. 02/24/23 05/25/23  Ladona Heinz, MD  levothyroxine  (SYNTHROID ) 125 MCG tablet Take 125 mcg by mouth daily before breakfast.    [provider]  nitroGLYCERIN  (NITROSTAT ) 0.4 MG SL tablet Place 1 tablet (0.4 mg total) under the tongue every 5 (five) minutes as needed for chest pain. 07/02/21   Cantwell, Celeste C, PA-C  TABRECTA 200 MG tablet Take 400 mg by mouth in the morning and at bedtime. 07/01/21   [provider]  Vitamin D , Ergocalciferol , (DRISDOL ) 1.25 MG (50000 UNIT) CAPS capsule Take 1 capsule (50,000 Units total) by mouth every 7 (seven) days. 02/24/23   Ladona Heinz, MD      Allergies    Patient has no known allergies.    Review of Systems   Review of Systems  Cardiovascular:  Positive for chest pain.  Neurological:  Positive for dizziness.    Physical Exam Updated Vital Signs BP 121/89   Pulse 64   Temp 98.1 F (36.7 C) (Oral)   Resp 14   SpO2 100%  Physical Exam Vitals and nursing note reviewed.  Constitutional:      General: He is not in acute distress.    Appearance: He is well-developed. He is not diaphoretic.  HENT:     Head: Normocephalic and atraumatic.  Eyes:     General: No scleral icterus.    Conjunctiva/sclera: Conjunctivae normal.  Cardiovascular:     Rate and Rhythm: Normal rate and regular rhythm.     Heart sounds: Normal heart sounds.  Pulmonary:     Effort: Pulmonary effort  is normal. No respiratory distress.     Breath sounds: Normal breath sounds.  Abdominal:     Palpations: Abdomen is soft.     Tenderness: There is no abdominal tenderness.  Musculoskeletal:     Cervical back: Normal range of motion and neck supple.  Skin:    General: Skin is warm and dry.  Neurological:     Mental Status: He is alert.     Comments: Speech is clear and goal oriented, follows commands Major Cranial nerves without  objective deficit, no facial droop Normal strength in upper and lower extremities bilaterally including dorsiflexion and plantar flexion, strong and equal grip strength Sensation abnormal.  Numbness and tingling upper extremities bilaterally, subjective numbness  on the right side of the face in all 3 distributions of the facial nerve, metoprolol  hyperesthesia on the left arm as compared to the right Moves extremities without ataxia, coordination intact Normal finger to nose and rapid alternating movements Neg romberg, no pronator drift Normal gait Normal heel-shin and balance   Psychiatric:        Behavior: Behavior normal.     ED Results / Procedures / Treatments   Labs (all labs ordered are listed, but only abnormal results are displayed) Labs Reviewed  BASIC METABOLIC PANEL - Abnormal; Notable for the following components:      Result Value   Creatinine, Ser 1.37 (*)    All other components within normal limits  CBC  RAPID URINE DRUG SCREEN, HOSP PERFORMED  URINALYSIS, ROUTINE W REFLEX MICROSCOPIC  TROPONIN I (HIGH SENSITIVITY)  TROPONIN I (HIGH SENSITIVITY)    EKG None ECG interpretation   Date: 08/03/2023  Rate: 78  Rhythm: normal sinus rhythm  QRS Axis: normal  Intervals: normal  ST/T Wave abnormalities: normal  Conduction Disutrbances: none  Narrative Interpretation:   Old EKG Reviewed:  No significant changes noted    Radiology MR BRAIN W CONTRAST Result Date: 08/03/2023 CLINICAL DATA:  Headache, neuro deficit. Abnormal noncontrast MRI with focus of susceptibility in the left parietal lobe. History of cancer. EXAM: MRI HEAD WITH CONTRAST TECHNIQUE: Multiplanar, multiecho pulse sequences of the brain and surrounding structures were obtained with intravenous contrast. CONTRAST:  8mL GADAVIST  GADOBUTROL  1 MMOL/ML IV SOLN COMPARISON:  Noncontrast head MRI 08/03/2023 FINDINGS: No abnormal intracranial enhancement is identified, including in the region of left parietal susceptibility on today's earlier MRI. There is no mass. IMPRESSION: No abnormal intracranial enhancement. No evidence of metastatic disease. Electronically Signed   By: Dasie Hamburg M.D.   On: 08/03/2023 18:50   MR BRAIN WO CONTRAST Result Date:  08/03/2023 CLINICAL DATA:  Neuro deficit, acute, stroke suspected EXAM: MRI HEAD WITHOUT CONTRAST TECHNIQUE: Multiplanar, multiecho pulse sequences of the brain and surrounding structures were obtained without intravenous contrast. COMPARISON:  CT head 03/05/2023. FINDINGS: Brain: No acute infarction, acute hemorrhage, hydrocephalus, extra-axial collection or definite mass lesion. Mild scattered T2/FLAIR hyperintensities in the white matter nonspecific but compatible with chronic microvascular ischemic disease. Remote bilateral cerebellar infarcts. Small focus of susceptibility artifact in the left parietal region with suspected slight encephalomalacia, suggestive of prior hemorrhagic infarct. Vascular: Major arterial flow voids are maintained at the skull base. Skull and upper cervical spine: Normal marrow signal. Sinuses/Orbits: Clear sinuses.  No acute orbital findings. Other: No mastoid effusions. IMPRESSION: 1. No definite evidence of acute intracranial abnormality. 2. Focus of susceptibility artifact in the left parietal lobe probably represents a nonspecific prior hemorrhage or hemorrhagic infarct; however, given the patient's known history of multiple malignancies  recommend postcontrast imaging to exclude enhancing hemorrhagic metastasis. 3. Remote bilateral cerebellar and left parietal infarcts and chronic microvascular ischemic disease. Electronically Signed   By: Gilmore GORMAN Molt M.D.   On: 08/03/2023 17:17   DG Chest 2 View Result Date: 08/03/2023 CLINICAL DATA:  Chest pain.  Arm pain and numbness. EXAM: CHEST - 2 VIEW COMPARISON:  06/02/2022. FINDINGS: Cardiac silhouette is normal in size and configuration. Normal mediastinal and hilar contours. Clear lungs.  No pleural effusion or pneumothorax. Skeletal structures are unremarkable. IMPRESSION: No active cardiopulmonary disease. Electronically Signed   By: Alm Parkins M.D.   On: 08/03/2023 15:57    Procedures Procedures    Medications Ordered  in ED Medications  aspirin  chewable tablet 324 mg (324 mg Oral Given 08/03/23 1504)  gadobutrol  (GADAVIST ) 1 MMOL/ML injection 8 mL (8 mLs Intravenous Contrast Given 08/03/23 1826)    ED Course/ Medical Decision Making/ A&P Clinical Course as of 08/03/23 2113  Tue Aug 03, 2023  1607 Case discussed with Dr. Deedra.  Patient is not in the window for tPA and has a low NIH stroke scale.  He recommends MRI brain. [AH]  1916 MR BRAIN W CONTRAST [AH]  1916 MR BRAIN WO CONTRAST I reviewed both MR brain without and MR brain with.  Initial MRI brain concerning for potential hemorrhagic metastatic disease of the brain.  MRI brain with contrast negative for acute finding. [AH]    Clinical Course User Index [AH] Arloa Chroman, PA-C                                 Medical Decision Making Amount and/or Complexity of Data Reviewed Labs: ordered. Radiology: ordered. Decision-making details documented in ED Course.  Risk Prescription drug management.   This patient presents to the ED for concern of paresthesia similar to previous MI.a past medical history of cancer under treatment, previous stroke, previous MI which further complicates his assessment.  His complaint involves an extensive number of treatment options, and is a complaint that carries with it a high risk of complications and morbidity.  Differential diagnosis includes MI, stroke, brain metastasis, electrolyte disturbance, demyelinating lesion, anxiety    Social Determinants of Health:   SDOH Screenings   Depression (PHQ2-9): Low Risk  (08/19/2021)  Social Connections: Unknown (12/08/2021)   Received from [facility], [facility]  Tobacco Use: High Risk (05/06/2023)   Received from [facility]     Additional history:  {Additional history obtained from patient at bedside {External records from outside source obtained and reviewed including previous oncology notes  Lab Tests:  I Ordered, and personally interpreted labs.   The pertinent results include:   I ordered labs.  He has a mild bump in his creatinine.  Troponin is negative x 2, remainder of his labs are reassuring  Imaging Studies:  I ordered imaging studies including two-view chest, MR brain with and without contrast I independently visualized and interpreted imaging which showed as per the ED course-no acute findings I agree with the radiologist interpretation  Cardiac Monitoring/ECG:  The patient was maintained on a cardiac monitor.  I personally viewed and interpreted the cardiac monitored which showed an underlying rhythm of: Normal sinus rhythm  Medicines ordered and prescription drug management:  I ordered medication including  Medications  aspirin  chewable tablet 324 mg (324 mg Oral Given 08/03/23 1504)  gadobutrol  (GADAVIST ) 1 MMOL/ML injection 8 mL (8 mLs Intravenous Contrast Given 08/03/23  1826)   for chest discomfort Reevaluation of the patient after these medicines showed that the patient improved I have reviewed the patients home medicines and have made adjustments as needed  Test Considered:    Critical Interventions:    Consultations Obtained: As per ED course consulted with neurology, Dr. Voncile  Problem List / ED Course:     ICD-10-CM   1. Paresthesia  R20.2       MDM: Review of all data points patient has no emergent cause of symptoms.  Patient may have just had a panic attack but does not appear to have any brain metastasis MI or other cause of symptoms.  The patient follow closely with his PCP   Dispostion:  After consideration of the diagnostic results and the patients response to treatment, I feel that the patent would benefit from discharge at this time.         Final Clinical Impression(s) / ED Diagnoses Final diagnoses:  Paresthesia    Rx / DC Orders ED Discharge Orders     None         Arloa Chroman, PA-C 08/03/23 2126    Jerrol Agent, MD 08/04/23 1353

## 2023-08-09 ENCOUNTER — Other Ambulatory Visit (HOSPITAL_COMMUNITY): Payer: Self-pay | Admitting: Internal Medicine

## 2023-08-09 DIAGNOSIS — C3491 Malignant neoplasm of unspecified part of right bronchus or lung: Secondary | ICD-10-CM

## 2023-08-12 NOTE — Telephone Encounter (Signed)
I called patient and reminded patient of their appt on 08/16/2023 and patient confirmed they would be here. I also let patient know that we have moved into our new cancer facility and asked patient if they were aware of where we were now located, and patient voiced understanding of our new location. Patient knows not to arrive early and that we will get labs at the time of the follow up appointment and not the lab appointment time. I also made patient aware to eat and drink plenty of water to hydrate properly before coming to these appointments because this will make their lab draw much easier.

## 2023-08-15 ENCOUNTER — Encounter

## 2023-08-16 ENCOUNTER — Ambulatory Visit: Admit: 2023-08-16 | Discharge: 2023-08-16 | Payer: MEDICAID | Attending: Nurse Practitioner | Primary: Family Medicine

## 2023-08-16 ENCOUNTER — Inpatient Hospital Stay: Admit: 2023-08-16 | Discharge: 2023-08-16 | Payer: MEDICAID | Primary: Family Medicine

## 2023-08-16 ENCOUNTER — Encounter: Payer: Self-pay | Admitting: Cardiology

## 2023-08-16 VITALS — BP 134/78 | HR 82 | Temp 97.80000°F | Ht 73.0 in | Wt 217.3 lb

## 2023-08-16 DIAGNOSIS — I82591 Chronic embolism and thrombosis of other specified deep vein of right lower extremity: Secondary | ICD-10-CM

## 2023-08-16 DIAGNOSIS — Z86711 Personal history of pulmonary embolism: Secondary | ICD-10-CM

## 2023-08-16 DIAGNOSIS — Z79899 Other long term (current) drug therapy: Secondary | ICD-10-CM

## 2023-08-16 DIAGNOSIS — I25118 Atherosclerotic heart disease of native coronary artery with other forms of angina pectoris: Secondary | ICD-10-CM

## 2023-08-16 DIAGNOSIS — E032 Hypothyroidism due to medicaments and other exogenous substances: Secondary | ICD-10-CM

## 2023-08-16 LAB — COMPREHENSIVE METABOLIC PANEL
ALT: 15 U/L (ref 5–41)
AST: 17 U/L (ref 5–40)
Albumin: 4.1 g/dL (ref 3.5–5.2)
Alkaline Phosphatase: 62 U/L (ref 40–129)
Anion Gap: 12 mmol/L (ref 7–19)
BUN: 9 mg/dL (ref 6–20)
CO2: 26 mmol/L (ref 22–29)
Calcium: 8.6 mg/dL (ref 8.6–10.0)
Chloride: 102 mmol/L (ref 98–107)
Creatinine: 1.2 mg/dL (ref 0.7–1.2)
Est, Glom Filt Rate: 72 (ref >60)
Glucose: 130 mg/dL — ABNORMAL HIGH (ref 70–99)
Potassium: 4.3 mmol/L (ref 3.5–5.1)
Sodium: 140 mmol/L (ref 136–145)
Total Bilirubin: 0.3 mg/dL (ref 0.0–1.2)
Total Protein: 6.2 g/dL — ABNORMAL LOW (ref 6.4–8.3)

## 2023-08-16 LAB — CBC WITH AUTO DIFFERENTIAL
Basophils %: 0.5 % (ref 0.0–1.0)
Basophils Absolute: 0.03 10*3/uL (ref 0.00–0.20)
Eosinophils %: 5.5 % — ABNORMAL HIGH (ref 0.0–5.0)
Eosinophils Absolute: 0.34 10*3/uL (ref 0.00–0.60)
Hematocrit: 43.6 % (ref 42.0–52.0)
Hemoglobin: 15 g/dL (ref 14.0–18.0)
Lymphocytes %: 35.4 % (ref 20.0–40.0)
Lymphocytes Absolute: 2.19 10*3/uL (ref 1.10–4.50)
MCH: 29.8 pg (ref 27.0–31.0)
MCHC: 34.4 g/dL (ref 33.0–37.0)
MCV: 86.7 fL (ref 80.0–94.0)
MPV: 9.4 fL (ref 9.4–12.4)
Monocytes %: 9.2 % (ref 1.0–10.0)
Monocytes Absolute: 0.57 10*3/uL (ref 0.00–0.90)
Neutrophils %: 49.2 % — ABNORMAL LOW (ref 50.0–65.0)
Neutrophils Absolute: 3.04 10*3/uL (ref 1.50–7.50)
Platelets: 261 10*3/uL (ref 130–400)
RBC: 5.03 M/uL (ref 4.70–6.10)
RDW: 14.2 % (ref 11.5–14.5)
WBC: 6.18 10*3/uL (ref 4.80–10.80)

## 2023-08-16 MED ORDER — SILDENAFIL CITRATE 50 MG PO TABS: 50.0000 mg | ORAL_TABLET | Freq: Every day | ORAL | 3 refills | Status: AC | PRN

## 2023-08-16 NOTE — Telephone Encounter (Signed)
Echo order is in already and I sent the Rx

## 2023-08-16 NOTE — Telephone Encounter (Signed)
ICD-10-CM   1. 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 1/2 tab to 1 tab as needed daily    Dispense:  15 tablet    Refill:  3

## 2023-08-16 NOTE — Progress Notes (Signed)
Progress Note      Pt Name: Danny Ruiz  Birthdate: 06-08-1971  MRN: [id number]    Date of evaluation: 08/16/2023  History Obtained From:  patient, electronic medical record    CHIEF COMPLAINT:    Chief Complaint   Patient presents with    Follow-up     Multiple subsegmental pulmonary emboli without acute cor pulmonale (HCC)     HISTORY OF PRESENT ILLNESS:    Danny Ruiz is a 53 y.o. 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  Thrombophilia workup on 11/05/2022 was unrevealing for any underlying clotting disorders.  Current recommendation is for long-term anticoagulation with Eliquis 5 mg p.o. twice daily.  Danny Ruiz returns today in scheduled follow-up for evaluation, lab monitoring, review lab results and further treatment recommendations.    History of Present Illness  Danny Ruiz reports a positive response to Eliquis, with no new health concerns since his last visit. Danny Ruiz has an adequate supply of refills and confirms that his insurance covers the medication. Danny Ruiz does not experience any nosebleeds or excessive bruising. Danny Ruiz is under the regular care of Dr. Dan Humphreys and prefers to maintain a 28-month follow-up schedule.    Danny Ruiz has been advised to discontinue blood thinners 48 hours prior to a scheduled outpatient procedure to remove a painful spot on his back.    His inhaler regimen has been adjusted, resulting in improved respiratory function. Danny Ruiz continues to smoke but has made attempts to quit, including a trial of Chantix, which Danny Ruiz found unsatisfactory.    MEDICATIONS  Eliquis, Chantix (discontinued).       Today's clinic visit to include physical assessment, review of systems, any lab or radiographic findings that were available and plan of care are documented below.    HEMATOLOGIC HISTORY:     Diagnosis  Unprovoked bilateral pulmonary emboli with DVT  in the right lower extremity (gastrocnemius vein), March 2024: PESI score: 102 points. Class III, Intermediate Risk: 3.2-7.1% 30-day mortality in this group.  Thrombophilia workup on 11/05/2022 ruled out underlying clotting deficiency  History of provoked PE x 2 treated with Lovenox first episode and Eliquis second episode x 3 months      Treatment summary:  10/01/2022-patient was started on IV heparin and transitioned to Eliquis at discharge.  Recommendation is for lifelong anticoagulation with Eliquis 5 mg p.o. twice daily    Danny Ruiz was seen by Dr Ronda Fairly on 10/02/2022 for DVT/PE after presenting to the ER department with complaints of shortness of breath for a few weeks.  The patient denies any recent trauma, surgery, prolonged immobilization.  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  Danny Ruiz received Lovenox for his first episode and Eliquis x 3 months for his second episode.  The patient does not have a primary care.  Danny Ruiz has not had any age-appropriate cancer screening.  The patient is a daily smoker since age 37.  The patient denies any weight loss.  Denies any hematuria, melena, hematochezia or hematemesis.    10/01/2022-CT pulmonary protocol remarkable for-Multiple filling defects in the pulmonary arteries consistent with thromboemboli in the distal right main pulmonary artery multiple segmental and subsegmental branches of the right upper and middle lobes in the left lower lobe. - Right ventricle/Left ventricle ratio (normal <0.9): Normal. - Main pulmonary artery: Normal caliber.  Lung  Parenchyma, Pleura, and Airways: Mild thickening of the peripheral airways and scattered mucus plugging. No dense airspace consolidation. No suspicious nodule/mass. Dependent atelectasis in the lower lobes.  No pleural effusion or pneumothorax. 1 cm  subpleural nodule at the left lower lobe base on axial image 80, coronal image 83.  Thoracic Inlet, Mediastinum, and Hila: Thyroid gland  is unremarkable. No lymphadenopathy.  Heart, Vessels, and Pericardium: Normal caliber aorta. No visible coronary artery calcifications. No cariomegaly. No pericardial effusion.  Bones and Soft Tissues: No acute osseous abnormality. No mass or adenopathy.  Upper Abdomen: 2 cm cyst noted in the right lobe of the liver.  Post left nephrectomy.  Impression: 1 cm subpleural nodule in the left lower lobe could be post infectious/inflammatory or neoplastic.      09/30/21- 2D echo-normal LVEF 55-60%. No regional wall motion. Mild concentric left ventricular hypertrophy. Normal  diastolic function. Normal right ventricular size with preserved RV function (TAPSE 23 mm).  Normal bi-atrial size.  No clinically significant valvular stenosis or aortic regurgitation. Aortic root and ascending aorta dimensions are within normal limits.  The IVC is normal. The rhythm is sinus.    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  No additional DVT visualized.  No evidence of SVT or reflux noted at this time.    10/01/2022-patient was started on IV heparin.     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    11/03/2022 CTA pulmonary- reported complete or near complete resolution of previously seen pulmonary emboli bilaterally. Question only trace residual right lower lobe pulmonary embolus at a branch point versus artifact. No acute abnormality in the thorax. Scattered bilateral pulmonary nodularity is again noted without significant change measuring up to 5 mm.   Previously seen 1 cm nodular opacity adjacent the left hemidiaphragm in the left lower lobe is no longer visualized and likely represents resolution of an inflammatory process.  Overall improving bronchial wall thickening to the left lower  lobe in comparison to prior.     11/05/2022 Serology results  Antithrombin III activity 97%  Prothrombin gene mutation negative  Protein C functional 132%  Activated Protein C Resistance 3.96  Protein S Antigen 111%  Factor 5 Leiden negative    01/27/2023 CTA pulmonary- reported no pulmonary thromboembolism. Unchanged bilateral pulmonary nodes measuring up to 0.5 cm.  According to the Fleischner Society guidelines, in a low risk patient no further follow-up is necessary.  However, in high risk patient obtaining CT chest in 12 months is optional. Unchanged mild right hilar lymphadenopathy, nonspecific but likely reactive. Status post left nephrectomy.     06/02/2023 CTA pulmonary- reported limited study. As seen, no large central PE.  However, limited evaluation of the lobar, segmental and subsegmental pulmonary arteries. Stable pulmonary nodules.  Continued clinical and imaging surveillance. Mild emphysema. Stable right hilar lymph node.  Stable prominent axillary lymph nodes.    Age-appropriate health screening:  Has had no routine health screening  Again today encouraged him to Cologuard test kit    Past Medical History:    Past Medical History:   Diagnosis Date    Asthma     DVT (deep venous thrombosis) (HCC)     had on two occasions in the past    GERD (gastroesophageal reflux disease)  Pulmonary embolism (HCC) 10/01/2022    Tobacco abuse        Past Surgical History:    Past Surgical History:   Procedure Laterality Date    KIDNEY REMOVAL Left     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"       Current Medications:    Current Outpatient Medications   Medication Sig Dispense Refill    fluticasone furoate-vilanterol (BREO ELLIPTA) 100-25 MCG/ACT inhaler Inhale 1 puff into the lungs daily 1 each 11    apixaban (ELIQUIS) 5 MG TABS tablet Take 1 tablet by mouth 2 times daily 60 tablet 5    simvastatin (ZOCOR) 40 MG tablet Take 1 tablet by mouth nightly 90 tablet 1    albuterol sulfate HFA (VENTOLIN HFA) 108  (90 Base) MCG/ACT inhaler Inhale 2 puffs into the lungs every 6 hours as needed for Wheezing 18 g 3    famotidine (PEPCID) 40 MG tablet Take 1 tablet by mouth every evening 30 tablet 3    Glucosamine-Chondroitin (GLUCOSAMINE CHONDR COMPLEX PO) Take by mouth in the morning and at bedtime      acetaminophen (TYLENOL) 325 MG tablet Take 2 tablets by mouth every 6 hours as needed for Pain      varenicline (CHANTIX) 1 MG tablet Take 1 tablet by mouth 2 times daily (Patient not taking: Reported on 08/16/2023) 60 tablet 5     No current facility-administered medications for this visit.        Allergies:   Allergies   Allergen Reactions    Nsaids        Social History:    Social History     Tobacco Use    Smoking status: Every Day     Current packs/day: 1.50     Average packs/day: 1.5 packs/day for 40.1 years (60.1 ttl pk-yrs)     Types: Cigarettes     Start date: 6    Smokeless tobacco: Never    Tobacco comments:     Started at age 43, has had a PPD on avrg since Danny Ruiz began, smoked 40 pack-years   Vaping Use    Vaping status: Never Used   Substance Use Topics    Alcohol use: Not Currently    Drug use: Not Currently     Comment: tried MJ a few times in the past       Family History:   Family History   Problem Relation Age of Onset    No Known Problems Mother     No Known Problems Father     No Known Problems Brother     No Known Problems Half-Sister     No Known Problems Half-Sister     No Known Problems Daughter     No Known Problems Daughter     No Known Problems Son        Vitals:  Vitals:    08/16/23 1017   BP: 134/78   Site: Left Upper Arm   Position: Sitting   Cuff Size: Large Adult   Pulse: 82   Temp: 97.8 F (36.6 C)   TempSrc: Temporal   SpO2: 97%   Weight: 98.6 kg (217 lb 4.8 oz)   Height: 1.854 m (6\' 1" )          Subjective   REVIEW OF SYSTEMS:   Review of Systems   Constitutional: Negative.  Negative for chills, diaphoresis and fever.   HENT: Negative.  Negative for congestion, ear pain, hearing loss,  nosebleeds, sore throat and tinnitus.    Eyes: Negative.  Negative for pain, discharge and redness.   Respiratory: Negative.  Negative for cough, shortness of breath and wheezing.    Cardiovascular: Negative.  Negative for chest pain, palpitations and leg swelling.   Gastrointestinal: Negative.  Negative for abdominal pain, blood in stool, constipation, diarrhea, nausea and vomiting.   Endocrine: Negative for polydipsia.   Genitourinary:  Negative for dysuria, flank pain, frequency, hematuria and urgency.   Musculoskeletal: Negative.  Negative for back pain, myalgias and neck pain.   Skin: Negative.  Negative for rash.   Neurological: Negative.  Negative for dizziness, tremors, seizures, weakness and headaches.   Hematological:  Does not bruise/bleed easily.   Psychiatric/Behavioral: Negative.  The patient is not nervous/anxious.        Objective   PHYSICAL EXAM:  Physical Exam  Vitals reviewed.   Constitutional:       General: Danny Ruiz is not in acute distress.     Appearance: Danny Ruiz is well-developed.   HENT:      Head: Normocephalic and atraumatic.      Mouth/Throat:      Pharynx: Uvula midline.      Tonsils: No tonsillar exudate.   Eyes:      General: Lids are normal.      Conjunctiva/sclera: Conjunctivae normal.      Pupils: Pupils are equal, round, and reactive to light.   Neck:      Thyroid: No thyroid mass or thyromegaly.      Vascular: No JVD.      Trachea: Trachea normal. No tracheal deviation.   Cardiovascular:      Rate and Rhythm: Normal rate and regular rhythm.      Pulses: Normal pulses.      Heart sounds: Normal heart sounds.   Pulmonary:      Effort: Pulmonary effort is normal. No respiratory distress.      Breath sounds: Normal breath sounds. No wheezing or rales.   Chest:      Chest wall: No tenderness.   Abdominal:      General: Bowel sounds are normal. There is no distension.      Palpations: Abdomen is soft. There is no mass.      Tenderness: There is no abdominal tenderness. There is no guarding.    Musculoskeletal:         General: No tenderness or deformity.      Cervical back: Normal range of motion and neck supple.      Comments: Range of motion within normal limits x4 extremities   Skin:     General: Skin is warm.      Findings: No bruising, erythema or rash.   Neurological:      Mental Status: Danny Ruiz is alert and oriented to person, place, and time.      Cranial Nerves: No cranial nerve deficit.      Coordination: Coordination normal.   Psychiatric:         Behavior: Behavior normal.         Thought Content: Thought content normal.         Labs reviewed today:  Lab Results   Component Value Date    WBC 6.18 08/16/2023    HGB 15.0 08/16/2023    HCT 43.6 08/16/2023    MCV 86.7 08/16/2023    PLT 261 08/16/2023     Lab Results   Component Value Date    NEUTROABS 3.04 08/16/2023  Lab Results   Component Value Date    NA 140 08/16/2023    K 4.3 08/16/2023    CL 102 08/16/2023    CO2 26 08/16/2023    BUN 9 08/16/2023    CREATININE 1.2 08/16/2023    GLUCOSE 130 (H) 08/16/2023    CALCIUM 8.6 08/16/2023    BILITOT 0.3 08/16/2023    ALKPHOS 62 08/16/2023    AST 17 08/16/2023    ALT 15 08/16/2023    LABGLOM 72 08/16/2023    GLOB 2.7 11/05/2022       ASSESSMENT/PLAN:      1. Unprovoked bilateral pulmonary emboli with DVT in the right lower extremity (gastrocnemius vein) March 2024, Danny Ruiz has history of DVT on 2 prior occasions that were provoked.  Thrombophilia workup on 11/05/2022 was unrevealing for any underlying clotting deficiency.      Current recommendation is for long-term anticoagulation with Eliquis 5 mg p.o. twice daily.    Assessment & Plan    The repeat CTA of the lungs on 06/02/2023, did not reveal any significant central pulmonary emboli, although the study was somewhat limited. His lung nodules have remained stable. Given his improved respiratory function, there is no need to alter the current treatment plan. His blood counts are within normal range, with a hemoglobin level of 15 and platelet count of  261. Danny Ruiz will continue lifelong anticoagulation therapy with Eliquis due to his recurrent thrombotic event with last being unprovoked bilateral pulmonary emboli.    Danny Ruiz needs to be scheduled for a minor surgical procedure to remove a lesion on his back that is causing pain. Danny Ruiz has been instructed to discontinue his blood thinners 48 hours prior to the procedure. Danny Ruiz will inform the office once the procedure is scheduled so that a release can be provided, at this point holding the Eliquis for 48 hours is appropriate, if a lengthier time is needed then we will need to bridge with Lovenox.    -Continue Eliquis 5 mg p.o. twice daily  -Encouraged wearing compression socks and elevating right lower extremity is much as possible.      2. Tobacco abuse: Currently smoking 1 pack/day for the last 35 years: Was unsuccessful with Chantix, ineffective  We talked about the importance of quitting smoking for approximately 4-5 minutes. Specifically, we discussed the risk related tobacco including but not limited to carcinoma, cardiovascular disease, stroke, and financial loss. I advised to quit smoking and offered resources to include nicotine patches to help with the craving. The patient is contemplated at this time. I will follow-up on smoking cessation during the next visit and continue to encourage quitting tobacco use.     Call the [facility] Tobacco Quit Line 1-800-QUIT-NOW - nicotine gum, patches.  Consider class at [facility]    3.  Routine health screening:  Strongly recommend completing a Cologuard  PSA 0.38 on 02/15/2023    4. Bilateral pulmonary nodes measuring up to 0.5 cm with mild right hilar lymphadenopathy, suspected to be reactive, CTA of the chest 01/27/2023.  (CTA of the chest on 05/31/2023 reported 1 cm subpleural nodule in the left lower lobe) History of asthma and currently symptomatic with inspiratory wheezing with exertion.    06/02/2023 CTA pulmonary- reported limited study. As seen, no large central PE.  However, limited  evaluation of the lobar, segmental and subsegmental pulmonary arteries. Stable pulmonary nodules.  Continued clinical and imaging surveillance. Mild emphysema. Stable right hilar lymph node.  Stable prominent axillary lymph nodes.    Marland Kitchen  Danny Ruiz is advised to contact Dr. Laureen Abrahams office to discuss the timing of his upcoming CT scan, which is scheduled for 12/19/2023, and his appointment on 12/13/2023.    -Follow up with Dr. Riley Churches as recommended      I discussed all of the above findings included in the assessment and plan with the patient and the patient is in agreement to move forward with current recommendations/treatment.  I have addressed all of their questions and concerns that were verbalized.         FOLLOW UP:  Follow-up appointment made for 6 months, or sooner, if needed bilateral history of pulmonary emboli and right lower extremity DVT and long-term anticoagulation management along with bilateral subcentimeter pulmonary nodules with lymphadenopathy  Continue to follow with other medical providers as recommended  Labs at next visit: CBC and CMP    EMR Dragon/Transcription disclaimer:   Much of this encounter note is an electronic transcription/translation of spoken language to printed text. The electronic translation of spoken language may permit erroneous, or at times, nonsensical words or phrases to be inadvertently transcribed; although attempts have made to review the note for such errors, some may still exist.  Please excuse any unrecognized transcription errors and contact us if the error is unintelligible or needs documented correction.  Also, portions of this note have been copied forward, however, changed to reflect the most current clinical status of this patient.    The patient (or guardian, if applicable) and other individuals in attendance with the patient were advised that Artificial Intelligence will be utilized during this visit to record, process the conversation to generate a clinical  note, and support improvement of the AI technology. The patient (or guardian, if applicable) and other individuals in attendance at the appointment consented to the use of AI, including the recording.      Electronically signed by Daisey Must, APRN on 08/17/2023 at 6:34 AM  I, Truitt Merle, am starting this note as a registered nurse for Thomasene Ripple, APRN.

## 2023-08-17 NOTE — Addendum Note (Signed)
Addended by: Dossie Arbour on: 08/17/2023 08:20 AM   Modules accepted: Orders

## 2023-08-27 NOTE — Telephone Encounter (Signed)
I spoke with patient and he can be here for 2/26 appointment with Dr Jacinto Halim

## 2023-09-06 ENCOUNTER — Encounter: Payer: MEDICAID | Attending: Family Medicine | Primary: Family Medicine

## 2023-09-08 ENCOUNTER — Ambulatory Visit (HOSPITAL_COMMUNITY): Payer: Medicare Other | Attending: Cardiology

## 2023-09-08 DIAGNOSIS — I25118 Atherosclerotic heart disease of native coronary artery with other forms of angina pectoris: Secondary | ICD-10-CM | POA: Diagnosis present

## 2023-09-08 LAB — ECHOCARDIOGRAM COMPLETE
Area-P 1/2: 4.31 cm2
S' Lateral: 3 cm

## 2023-09-09 ENCOUNTER — Inpatient Hospital Stay: Admit: 2023-09-09 | Discharge: 2023-09-09 | Disposition: A | Discharge status: Home / Self Care | Payer: MEDICAID

## 2023-09-09 ENCOUNTER — Encounter: Payer: Self-pay | Admitting: Cardiology

## 2023-09-09 DIAGNOSIS — K047 Periapical abscess without sinus: Secondary | ICD-10-CM

## 2023-09-09 DIAGNOSIS — E7800 Pure hypercholesterolemia, unspecified: Secondary | ICD-10-CM

## 2023-09-09 MED ORDER — AMOXICILLIN-POT CLAVULANATE 875-125 MG PO TABS
875-125 | ORAL_TABLET | Freq: Two times a day (BID) | ORAL | 0 refills | Status: AC
Start: 2023-09-09 — End: 2023-09-23

## 2023-09-09 MED ORDER — PREDNISONE 50 MG PO TABS
50 | ORAL_TABLET | Freq: Every day | ORAL | 0 refills | Status: AC
Start: 2023-09-09 — End: 2023-09-14

## 2023-09-09 MED ORDER — AMOXICILLIN-POT CLAVULANATE 875-125 MG PO TABS
875-125 | Freq: Once | ORAL | Status: AC
Start: 2023-09-09 — End: 2023-09-09
  Administered 2023-09-09: 21:00:00 1 via ORAL

## 2023-09-09 MED FILL — AMOXICILLIN-POT CLAVULANATE 875-125 MG PO TABS: 875-125 MG | ORAL | Qty: 1

## 2023-09-09 NOTE — Progress Notes (Signed)
Essentially normal echocardiogram.

## 2023-09-09 NOTE — ED Provider Notes (Signed)
 [facility] EMERGENCY DEPARTMENT  EMERGENCY DEPARTMENT ENCOUNTER      Pt Name: Danny Ruiz  MRN: [id number]  Birthdate 1971/03/10  Date of evaluation: 09/09/2023  Provider: Ilona Sorrel, PA-C    CHIEF COMPLAINT       Chief Complaint   Patient presents with    Dental Problem     Right facial swelling, dental decay         HISTORY OF PRESENT ILLNESS   (Location/Symptom, Timing/Onset,Context/Setting, Quality, Duration, Modifying Factors, Severity)  Note limiting factors.   HPI    Danny Ruiz is a 53 y.o. male who presents to the emergency department with a chief complaint of dental pain.  Patient reports that intermittently over the past several months he has been having some right sided molar pain.  The second molar on the right side of the maxilla has been causing him some intermittent pain, but it has usually resolved with Tylenol.  He noticed a couple days ago that his pain was starting to worsen there, then woke this morning with right sided facial swelling adjacent to the tooth, so he came to the ED for further evaluation.  He is not currently established with a dentist.  Does not have dental insurance, and states that he has not had an opportunity to go to the walk-in clinic yet since it is further away in [place].  He denies fevers, chills, nausea, vomiting, difficulty opening or closing the jaw, and has no additional complaints at this time.  He does note that he is not allowed to take anti-inflammatory pain medications because he only has 1 kidney.  He has not been taking any ibuprofen, but has been taking Tylenol with only minimal relief    Nursing Notes were reviewed.    Limitations to history: None  Outside historians none    REVIEW OF SYSTEMS    (2-9 systems for level 4, 10 or more for level 5)     Review of Systems    A complete review of systems was performed and is negative except as noted above in the HPI.       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"         CURRENT MEDICATIONS       Discharge Medication List as of 09/09/2023  4:06 PM        CONTINUE these medications which have NOT CHANGED    Details   fluticasone furoate-vilanterol (BREO ELLIPTA) 100-25 MCG/ACT inhaler Inhale 1 puff into the lungs daily, Disp-1 each, R-11Normal      apixaban (ELIQUIS) 5 MG TABS tablet Take 1 tablet by mouth 2 times daily, Disp-60 tablet, R-5Normal      simvastatin (ZOCOR) 40 MG tablet Take 1 tablet by mouth nightly, Disp-90 tablet, R-1Normal      albuterol sulfate HFA (VENTOLIN HFA) 108 (90 Base) MCG/ACT inhaler Inhale 2 puffs into the lungs every 6 hours as needed for Wheezing,  Disp-18 g, R-3Normal      famotidine (PEPCID) 40 MG tablet Take 1 tablet by mouth every evening, Disp-30 tablet, R-3Normal      varenicline (CHANTIX) 1 MG tablet Take 1 tablet by mouth 2 times daily, Disp-60 tablet, R-5Normal      Glucosamine-Chondroitin (GLUCOSAMINE CHONDR COMPLEX PO) Take by mouth in the morning and at bedtimeHistorical Med      acetaminophen (TYLENOL) 325 MG tablet Take 2 tablets by mouth every 6 hours as needed for PainHistorical Med             ALLERGIES     Nsaids    FAMILY HISTORY       Family History   Problem Relation Age of Onset    No Known Problems Mother     No Known Problems Father     No Known Problems Brother     No Known Problems Half-Sister     No Known Problems Half-Sister     No Known Problems Daughter     No Known Problems Daughter     No Known Problems Son           SOCIAL HISTORY       Social History     Socioeconomic History    Marital status: Married     Spouse name: Mrs. 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   Tobacco Use    Smoking status: Every Day     Current packs/day: 1.50     Average packs/day: 1.5 packs/day for 40.1 years (60.2 ttl pk-yrs)     Types: Cigarettes     Start date: 1985    Smokeless tobacco: Never    Tobacco comments:     Started at age 54, has had a PPD on avrg since he began, smoked 40 pack-years   Vaping Use    Vaping status: Never Used   Substance and Sexual Activity    Alcohol use: Not Currently    Drug use: Not Currently     Comment: tried MJ a few times in the past    Sexual activity: Yes     Partners: Female     Comment: Has 3 kids   Social History Narrative    CODE STATUS: Full Code    HEALTH CARE PROXY: Mrs. Damondre Pfeifle, his wife, [phone]    AMBULATES: independently    DOMICILED: has steps to enter his home, no stairs inside home, lives with his wife the kids are all grown, has a cat and dog in the home     Social Determinants of Health     Financial Resource Strain: Low Risk  (10/21/2022)    Overall Financial Resource Strain (CARDIA)     Difficulty of Paying Living Expenses: Not hard at all   Transportation Needs: Unknown (10/21/2022)    PRAPARE - Transportation     Lack of Transportation (Non-Medical): No   Physical Activity: Unknown (10/21/2022)    Exercise Vital Sign     Days of Exercise per Week: Patient declined    Received from [employer] [facility]    Family and [employer] Support    Received from [employer] [facility]    Abuse Screen   Housing Stability: Unknown (09/09/2023)    Housing Stability Vital  Sign     Number of Times Moved in the Last Year: 0     Homeless in the Last Year: No       SCREENINGS    Glasgow Coma Scale  Eye Opening: Spontaneous  Best Verbal Response: Oriented  Best Motor Response: Obeys commands  Glasgow Coma Scale Score: 15        PHYSICAL EXAM    (up to 7 for level  4, 8 or more for level 5)     ED Triage Vitals [09/09/23 1541]   BP Systolic BP Percentile Diastolic BP Percentile Temp Temp Source Pulse Respirations SpO2   (!) 123/93 -- -- 97.9 F (36.6 C) Oral 74 18 98 %      Height Weight - Scale         -- 99.8 kg (220 lb)             Physical Exam  Constitutional:       General: He is not in acute distress.     Appearance: Normal appearance. He is not ill-appearing or toxic-appearing.   HENT:      Head:      Jaw: There is normal jaw occlusion. No trismus, tenderness, swelling or pain on movement.      Salivary Glands: Right salivary gland is not diffusely enlarged or tender. Left salivary gland is not diffusely enlarged or tender.      Mouth/Throat:      Mouth: Mucous membranes are moist. No oral lesions or angioedema.      Dentition: Abnormal dentition. Dental tenderness and dental caries present. No gingival swelling or dental abscesses.      Pharynx: Oropharynx is clear. Uvula midline. No uvula swelling.      Tonsils: No tonsillar exudate or tonsillar abscesses.        Comments: Tenderness to the right maxillary second molar, with significant erosion of the gumline and exposed root.  No discretely drainable dental abscess.  Mild facial swelling correlating with this location.  No trismus, no pain when ranging the jaw.  No evidence of deep space neck infection or Ludwig's angina at this time  Eyes:      Pupils: Pupils are equal, round, and reactive to light.   Cardiovascular:      Rate and Rhythm: Normal rate and regular rhythm.      Pulses: Normal pulses.   Pulmonary:      Effort: Pulmonary effort is normal.      Breath sounds: Normal breath sounds.   Abdominal:      Palpations: Abdomen is soft.      Tenderness: There is no abdominal tenderness. There is no guarding or rebound.   Musculoskeletal:         General: No swelling or tenderness.      Right lower leg: No edema.      Left lower leg: No edema.   Skin:     Coloration: Skin is not jaundiced.   Neurological:       Mental Status: He is alert.   Psychiatric:         Mood and Affect: Mood normal.         Behavior: Behavior normal.       DIAGNOSTIC RESULTS     EKG: All EKG's are interpreted by the Emergency Department Physician who either signs or Co-signs this chart in the absence of a cardiologist.    RADIOLOGY:   Non-plain film images such as CT, Ultrasound and MRI are read  by the radiologist. Plainradiographic images are visualized and preliminarily interpreted by the emergency physician with the below findings:    Interpretation per the Radiologist below, if available at the time of this note:    No orders to display     ED BEDSIDE ULTRASOUND:   Performed by ED Physician - none    LABS:  Labs Reviewed - No data to display    All other labs were within normal range or not returned as of this dictation.    Medications   amoxicillin-clavulanate (AUGMENTIN) 875-125 MG per tablet 1 tablet (1 tablet Oral Given 09/09/23 1604)       EMERGENCY DEPARTMENT COURSE and DIFFERENTIALDIAGNOSIS/MDM:   Vitals:    Vitals:    09/09/23 1541   BP: (!) 123/93   Pulse: 74   Resp: 18   Temp: 97.9 F (36.6 C)   TempSrc: Oral   SpO2: 98%   Weight: 99.8 kg (220 lb)       MDM      The patient presented with a chief complaint of dental pain.  The differential diagnosis associated with this patient's presentation includes dental infection, dental abscess, gingivitis, dental fracture.  Considered Ludwig's angina, but this was ruled out by history and physical exam.  No trismus, no swelling underneath the tongue or underneath the jaw, is able to range the jaw appropriately, has no/under the tongue.  No findings concerning for deep space neck infection.    Patient is alert, oriented, pleasant and interactive, in no apparent distress.  Answers all questions appropriately.  Vital signs are stable.    Physical exam as above.  Patient will be treated with antibiotics and a pain regimen, and will follow-up with dentistry.    Patient is not allowed to take  anti-inflammatory pain medications given his history of nephrectomy.  Offered dental block, or narcotic pain medications for further pain control but he declines these.  He states that narcotics have given him headaches in the past, and does not wish to take them, and he does not wish to undergo a dental block or needlestick.    Given his inability to take anti-inflammatory pain medications, and as he is declining narcotic pain medications, will initiate prednisone burst for further pain control along with Augmentin.  We discussed the importance of follow-up with a dentist for further evaluation and recommendations.  Dental clinic resources are provided.     Patient will be discharged.  See below for follow-up instructions and discharge medications.  Patient is instructed to return to the ED for new or worsening symptoms to include trismus, worsening pain, swelling, fevers, chills, nausea, vomiting, difficulty opening the jaw, swelling under the tongue, or under the jaw, or if they have any new symptoms or concerns.  Discussed signs/symptoms most concerning that necessitate immediate return.  Patient verbalizes understanding of these return precautions and is given opportunity to ask any further questions they may have.  Patient denies further question/comments/concerns, is understanding and agreeable to stated plan, and is discharged home in stable condition             CONSULTS:  None    PROCEDURES:  Unless otherwise notedbelow, none     Procedures      FINAL IMPRESSION     1. Dental infection          DISPOSITION/PLAN   DISPOSITION Decision To Discharge 09/09/2023 03:57:35 PM   DISPOSITION CONDITION Stable           No notes  of EC Admission Criteria type on file.    PATIENT REFERRED TO:  [facility]  [address], [place], [place] [address]  [phone]  Call       [facility]  [phone]  [address], [place], [place] [address]  Call       Eliezer Bottom, MD  [address]  [place] [address]  [phone]    Schedule an appointment as soon as possible for a visit in 2 days      [facility] Emergency Department  [address]  [place] [address]  [phone]  Go to   If symptoms worsen      DISCHARGE MEDICATIONS:  Discharge Medication List as of 09/09/2023  4:06 PM        START taking these medications    Details   amoxicillin-clavulanate (AUGMENTIN) 875-125 MG per tablet Take 1 tablet by mouth 2 times daily for 14 days, Disp-28 tablet, R-0Normal      predniSONE (DELTASONE) 50 MG tablet Take 1 tablet by mouth daily for 5 days, Disp-5 tablet, R-0Normal                (Please note that portions of this note were completed with a voice recognition program.  Efforts were made to edit the dictations but occasionally words are mis-transcribed.)    Ilona Sorrel, PA-C (electronically signed)

## 2023-09-09 NOTE — Discharge Instructions (Addendum)
As we discussed in the ED, antibiotics and pain control regimens are a temporizing measure for dental pain, to allow for time for you to follow-up with a dentist.  Ultimately, a dentist needs to be involved for source control of this infection.  Source control may mean a root canal, cleaning, cavity filling, or even extraction of the tooth.  Please follow-up with your dentist for further evaluation and recommendations.  Please return to the ED for any new or worsening symptoms or concerns including worsening pain, swelling, swelling or redness under the jaw, difficulty opening or closing your jaw, fevers, chills, or if you have any new or worsening symptoms or concerns.  You may take Tylenol for your pain.  Please do not exceed 3000 mg of Tylenol in a day from all sources.  You may use ice for additional pain and swelling control, and the steroid that was prescribed will help with the pain and swelling as well.  Please follow-up with dentistry for further evaluation and recommendations and return to the ED for any new or worsening symptoms or concerns

## 2023-09-10 ENCOUNTER — Ambulatory Visit: Admit: 2023-09-10 | Discharge: 2023-09-10 | Payer: MEDICAID | Attending: Family Medicine | Primary: Family Medicine

## 2023-09-10 DIAGNOSIS — F17210 Nicotine dependence, cigarettes, uncomplicated: Secondary | ICD-10-CM

## 2023-09-10 DIAGNOSIS — I2694 Multiple subsegmental pulmonary emboli without acute cor pulmonale: Secondary | ICD-10-CM

## 2023-09-10 MED ORDER — ONDANSETRON 4 MG PO TBDP
4 | ORAL_TABLET | Freq: Three times a day (TID) | ORAL | 0 refills | Status: AC | PRN
Start: 2023-09-10 — End: ?

## 2023-09-10 MED ORDER — APIXABAN 5 MG PO TABS
5 | ORAL_TABLET | Freq: Two times a day (BID) | ORAL | 5 refills | Status: AC
Start: 2023-09-10 — End: ?

## 2023-09-10 MED ORDER — FAMOTIDINE 40 MG PO TABS
40 | ORAL_TABLET | Freq: Every evening | ORAL | 3 refills | Status: DC
Start: 2023-09-10 — End: 2024-02-14

## 2023-09-10 MED ORDER — ALBUTEROL SULFATE HFA 108 (90 BASE) MCG/ACT IN AERS
10890 | Freq: Four times a day (QID) | RESPIRATORY_TRACT | 3 refills | Status: AC | PRN
Start: 2023-09-10 — End: ?

## 2023-09-10 MED ORDER — SIMVASTATIN 40 MG PO TABS
40 | ORAL_TABLET | Freq: Every evening | ORAL | 1 refills | Status: AC
Start: 2023-09-10 — End: ?

## 2023-09-10 MED ORDER — NICOTINE 21 MG/24HR TD PT24
21 | MEDICATED_PATCH | Freq: Every day | TRANSDERMAL | 0 refills | 28.00 days | Status: DC
Start: 2023-09-10 — End: 2024-01-06

## 2023-09-10 MED ORDER — FLUTICASONE FUROATE-VILANTEROL 100-25 MCG/ACT IN AEPB
100-25 | Freq: Every day | RESPIRATORY_TRACT | 11 refills | Status: AC
Start: 2023-09-10 — End: ?

## 2023-09-10 NOTE — Progress Notes (Signed)
 Danny Ruiz (DOB:  September 30, 1970) is a 53 y.o. male,Established patient, here for evaluation of the following chief complaint(s):  Follow-up (Pt here for 3 month f/u.)      Assessment & Plan   ASSESSMENT/PLAN:  1. Cigarette nicotine dependence without complication  -     nicotine (NICODERM CQ) 21 MG/24HR; Place 1 patch onto the skin daily, Disp-42 patch, R-0Normal  2. Multiple subsegmental pulmonary emboli without acute cor pulmonale (HCC)  -     apixaban (ELIQUIS) 5 MG TABS tablet; Take 1 tablet by mouth 2 times daily, Disp-60 tablet, R-5Normal  3. 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 Mild persistent asthma, unspecified whether complicated  -     fluticasone furoate-vilanterol (BREO ELLIPTA) 100-25 MCG/ACT inhaler; Inhale 1 puff into the lungs daily, Disp-1 each, R-11Normal  5. Gastroesophageal reflux disease, unspecified whether esophagitis present  -     famotidine (PEPCID) 40 MG tablet; Take 1 tablet by mouth every evening, Disp-30 tablet, R-3Normal    Swelling seems to be improving though I do not have a way of direct comparison as compared to how it was prior to treatment.  No visible abscess on exam today  Advised patient to avoid spicy and acidic foods as these are the most likely to exacerbate his reflux  Patient is continuing to take Eliquis for his multiple small emboli.  Patient says he does think he is breathing somewhat better at this point  I did discuss with patient potential options for smoking cessation.  He was agreeable to the patches and an order for 21 mg patches has been sent.  Expected duration is 2 to 6 weeks with plan to step down to 14 mg though I did encourage him to try and  quit in conjunction with his spouse as this would be the easiest for both of them.    Return in about 6 months (around 03/09/2024).         Subjective   SUBJECTIVE/OBJECTIVE:  Danny Ruiz is a 53 y.o. male who presents for follow-up.  Patient was seen in the ER yesterday due to swelling in the face.  He was noted to have a dental abscess and was started on antibiotics and is doing much better today.  Patient is still taking Eliquis  Patient is still taking Pepcid and says that this is working for him.  He is still figuring out which foods exacerbate his symptoms the worst and is trying to avoid those.  He was unable to continue Chantix as this caused severe dreams.  He did it for 2 weeks however could not tell much difference while he was taking it.  He would potentially be willing to try patches.  Patient says that his spouse does smoke which makes it hard to quit              Review of Systems   Constitutional:  Negative for activity change and fever.   HENT:  Positive for dental problem and facial swelling. Negative for congestion.    Eyes:  Negative for visual disturbance.   Respiratory:  Negative for chest tightness, shortness of breath and wheezing.  Cardiovascular:  Negative for chest pain.   Gastrointestinal:  Negative for abdominal pain and blood in stool.   Genitourinary:  Negative for difficulty urinating and urgency.   Neurological:  Negative for weakness and headaches.   Psychiatric/Behavioral:  Negative for confusion.           Objective   Physical Exam  Vitals reviewed.   Constitutional:       General: He is not in acute distress.     Appearance: Normal appearance. He is not ill-appearing.   HENT:      Head: Normocephalic and atraumatic.      Comments: Mild facial swelling on the right side     Mouth/Throat:      Dentition: Abnormal dentition.   Cardiovascular:      Rate and Rhythm: Normal rate and regular rhythm.      Pulses: Normal pulses.      Heart sounds: Normal heart sounds. No murmur  heard.  Pulmonary:      Effort: Pulmonary effort is normal. No respiratory distress.      Breath sounds: Normal breath sounds. No wheezing or rhonchi.   Chest:      Chest wall: No tenderness.   Abdominal:      General: Abdomen is flat. Bowel sounds are normal. There is no distension.      Palpations: Abdomen is soft.      Tenderness: There is no abdominal tenderness.   Musculoskeletal:         General: No swelling.   Skin:     General: Skin is warm and dry.   Neurological:      General: No focal deficit present.      Mental Status: He is alert.            Vitals:    09/10/23 1510   BP: 124/82   Pulse: (!) 107   Resp: 18   Temp: 97.8 F (36.6 C)   SpO2: 96%        Current Outpatient Medications   Medication Sig Dispense Refill    apixaban (ELIQUIS) 5 MG TABS tablet Take 1 tablet by mouth 2 times daily 60 tablet 5    famotidine (PEPCID) 40 MG tablet Take 1 tablet by mouth every evening 30 tablet 3    fluticasone furoate-vilanterol (BREO ELLIPTA) 100-25 MCG/ACT inhaler Inhale 1 puff into the lungs daily 1 each 11    simvastatin (ZOCOR) 40 MG tablet Take 1 tablet by mouth nightly 90 tablet 1    albuterol sulfate HFA (VENTOLIN HFA) 108 (90 Base) MCG/ACT inhaler Inhale 2 puffs into the lungs every 6 hours as needed for Wheezing 18 g 3    nicotine (NICODERM CQ) 21 MG/24HR Place 1 patch onto the skin daily 42 patch 0    ondansetron (ZOFRAN-ODT) 4 MG disintegrating tablet Take 1 tablet by mouth 3 times daily as needed for Nausea or Vomiting 21 tablet 0    amoxicillin-clavulanate (AUGMENTIN) 875-125 MG per tablet Take 1 tablet by mouth 2 times daily for 14 days 28 tablet 0    predniSONE (DELTASONE) 50 MG tablet Take 1 tablet by mouth daily for 5 days 5 tablet 0    Glucosamine-Chondroitin (GLUCOSAMINE CHONDR COMPLEX PO) Take by mouth in the morning and at bedtime      acetaminophen (TYLENOL) 325 MG tablet Take 2 tablets by mouth every 6 hours as needed for Pain       No current facility-administered medications for this  visit.  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"      Allergies   Allergen Reactions    Nsaids         Lab Results   Component Value Date    NA 140 08/16/2023    K 4.3 08/16/2023    CL 102 08/16/2023    CO2 26 08/16/2023    BUN 9 08/16/2023    CREATININE 1.2 08/16/2023    GLUCOSE 130 (H) 08/16/2023    CALCIUM 8.6 08/16/2023    BILITOT 0.3 08/16/2023    ALKPHOS 62 08/16/2023    AST 17 08/16/2023    ALT 15 08/16/2023    LABGLOM 72 08/16/2023    GLOB 2.7 11/05/2022        Lab Results   Component Value Date    WBC 6.18 08/16/2023    HGB 15.0 08/16/2023    HCT 43.6 08/16/2023    MCV 86.7 08/16/2023    PLT 261 08/16/2023                EMR Dragon/transcription disclaimer:  Much of this encounter note is electronic transcription/translation of spoken language toprinted texts.  The electronic translation of spoken language may be erroneous, or at times, nonsensical words or phrases may be inadvertently transcribed.  Although I have reviewed the note for such errors, some may stillexist.      An electronic signature was used to authenticate this note.    --Eliezer Bottom, MD

## 2023-09-13 ENCOUNTER — Ambulatory Visit (HOSPITAL_COMMUNITY)
Admission: RE | Admit: 2023-09-13 | Discharge: 2023-09-13 | Disposition: A | Discharge status: Home / Self Care | Payer: Medicare Other | Source: Ambulatory Visit | Attending: Internal Medicine | Admitting: Internal Medicine

## 2023-09-13 DIAGNOSIS — C3491 Malignant neoplasm of unspecified part of right bronchus or lung: Secondary | ICD-10-CM | POA: Diagnosis present

## 2023-09-13 MED ORDER — IOHEXOL 350 MG/ML SOLN
50.0000 mL | Freq: Once | INTRAVENOUS | Status: AC | PRN
  Administered 2023-09-13: 50 mL via INTRAVENOUS

## 2023-09-22 ENCOUNTER — Encounter: Payer: Self-pay | Admitting: Cardiology

## 2023-09-22 ENCOUNTER — Ambulatory Visit: Payer: Medicare Other | Attending: Cardiology | Admitting: Cardiology

## 2023-09-22 VITALS — BP 130/78 | HR 80 | Resp 16 | Ht 69.0 in | Wt 188.0 lb

## 2023-09-22 DIAGNOSIS — E7800 Pure hypercholesterolemia, unspecified: Secondary | ICD-10-CM | POA: Diagnosis present

## 2023-09-22 DIAGNOSIS — I25118 Atherosclerotic heart disease of native coronary artery with other forms of angina pectoris: Secondary | ICD-10-CM | POA: Insufficient documentation

## 2023-09-22 DIAGNOSIS — E559 Vitamin D deficiency, unspecified: Secondary | ICD-10-CM | POA: Diagnosis present
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
## 2023-09-22 NOTE — Patient Instructions (Signed)
 Medication Instructions:   *If you need a refill on your cardiac medications before your next appointment, please call your pharmacy*   Lab Work:  If you have labs (blood work) drawn today and your tests are completely normal, you will receive your results only by: MyChart Message (if you have MyChart) OR A paper copy in the mail If you have any lab test that is abnormal or we need to change your treatment, we will call you to review the results.   Testing/Procedures:    Follow-Up: At [facility], you and your health needs are our priority.  As part of our continuing mission to provide you with exceptional heart care, we have created designated Provider Care Teams.  These Care Teams include your primary Cardiologist (physician) and Advanced Practice Providers (APPs -  Physician Assistants and Nurse Practitioners) who all work together to provide you with the care you need, when you need it.  We recommend signing up for the patient portal called "MyChart".  Sign up information is provided on this After Visit Summary.  MyChart is used to connect with patients for Virtual Visits (Telemedicine).  Patients are able to view lab/test results, encounter notes, upcoming appointments, etc.  Non-urgent messages can be sent to your provider as well.   To learn more about what you can do with MyChart, go to [email or web address].    Your next appointment:   1 year(s)  Provider:   Dr Jacinto Halim     Other Instructions

## 2023-09-28 NOTE — Telephone Encounter (Signed)
 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  If LDL is not controlled I may consider starting him on Repatha.

## 2023-10-13 ENCOUNTER — Encounter: Payer: Self-pay | Admitting: Cardiology

## 2023-10-13 LAB — LIPID PANEL
Chol/HDL Ratio: 2.8 ratio (ref 0.0–5.0)
Cholesterol, Total: 121 mg/dL (ref 100–199)
HDL: 43 mg/dL (ref >39)
LDL Chol Calc (NIH): 64 mg/dL (ref 0–99)
Triglycerides: 67 mg/dL (ref 0–149)
VLDL Cholesterol Cal: 14 mg/dL (ref 5–40)

## 2023-10-13 NOTE — Progress Notes (Signed)
 Normal lipid profile. LDL is at goal, continue present management

## 2023-10-22 ENCOUNTER — Telehealth (HOSPITAL_COMMUNITY): Payer: Self-pay | Admitting: Licensed Clinical Social Worker

## 2023-10-22 NOTE — Telephone Encounter (Signed)
 H&V Care Navigation CSW Progress Note  Clinical Social Worker consulted to discuss potential resources patient might access for dental coverage.  Pt has Medicare but they will only pay for extractions if it is preventing other medical treatment.   Pt reports he gets $1,400/month from SSDI and a few hundred a month from part time employment.  Patient usually meeting the criteria for Medicaid coverage and agreeable to CSW messaging case worker to complete phone application- referral sent.  CSW sent list of nearby dental clinics/schools who do free or sliding scale treatment.  Patient also planning on attending dental clinic at [facility] tomorrow.    CSW will plan to touch base with pt next week to check on progress and discuss next steps.   SDOH Screenings   Depression (PHQ2-9): Low Risk  (08/19/2021)  Financial Resource Strain: Medium Risk (10/22/2023)  Social Connections: Unknown (12/08/2021)   Received from [facility], [facility]  Tobacco Use: High Risk (09/22/2023)   Burna Sis, LCSW Clinical Social Worker [facility] Desk#: [phone] Cell#: [phone]

## 2023-10-22 NOTE — Telephone Encounter (Signed)
 Social worker will contact patient

## 2023-10-31 IMAGING — CT CT ABDOMEN W/ CM
3 of 5 series · 12 of 46 positions shown, 17 images · IV contrast (OMNIPAQUE)
Comparison: 05/19/2021.

CLINICAL DATA: Lung cancer, history of chemotherapy and
radiotherapy. History of testicular cancer.

EXAM:
CT CHEST AND ABDOMEN WITH CONTRAST
TECHNIQUE: Multidetector CT imaging of the chest and abdomen was performed
following the standard protocol during bolus administration of
intravenous contrast.

[Series 3: ca with · axial · 0.91mm/px · z∈[-684,-314]mm · 8 of 96 slices shown, 13 images]
[im 11/96  soft-tissue]
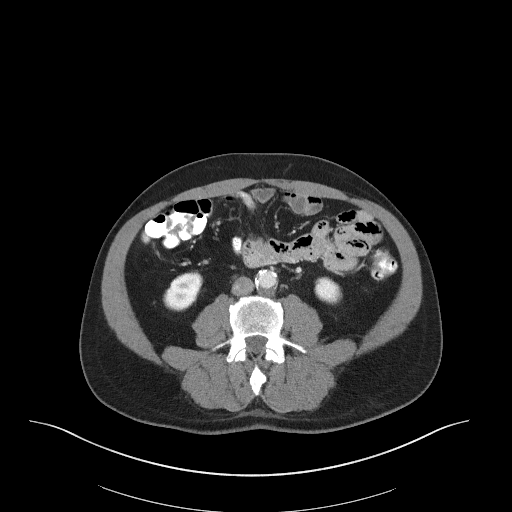
[im 11/96  bone]
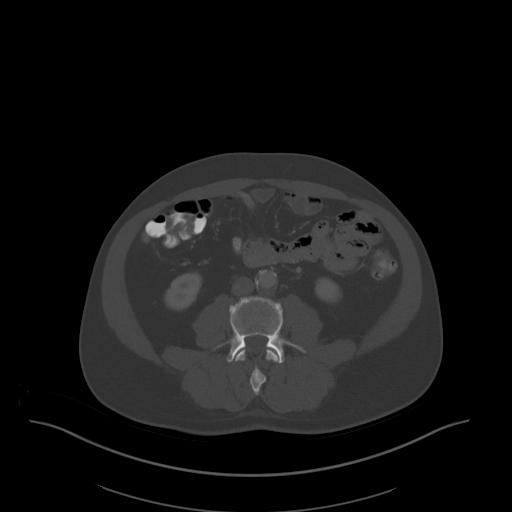
[im 22/96  soft-tissue]
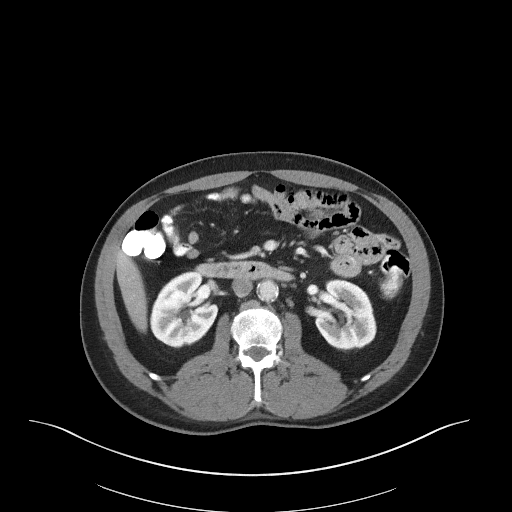
[im 32/96  soft-tissue]
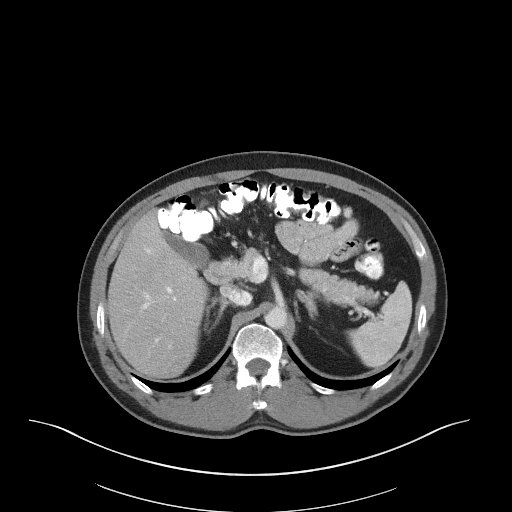
[im 43/96  soft-tissue]
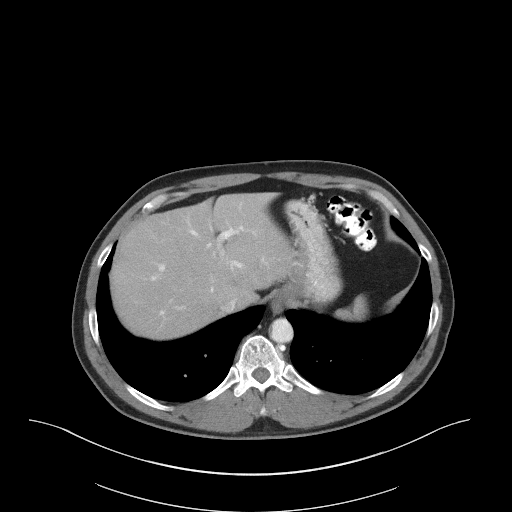
[im 53/96  soft-tissue]
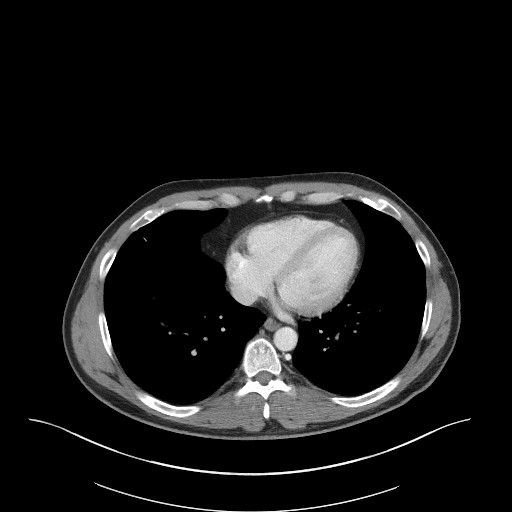
[im 53/96  lung]
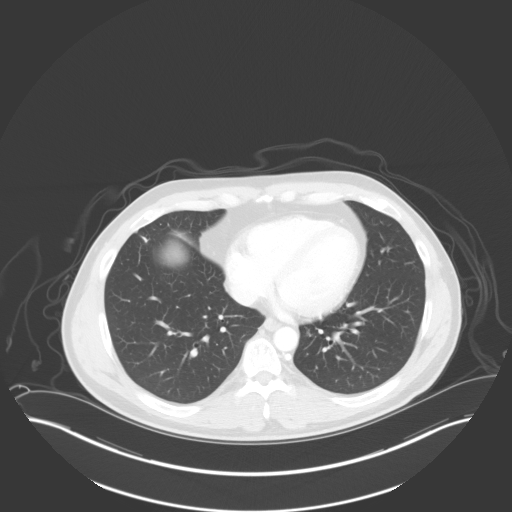
[im 64/96  soft-tissue]
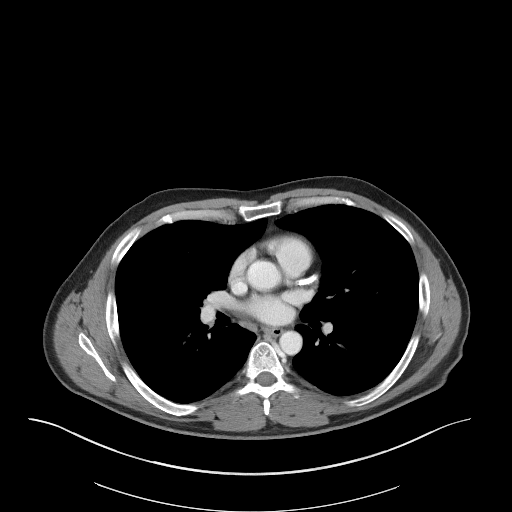
[im 64/96  lung]
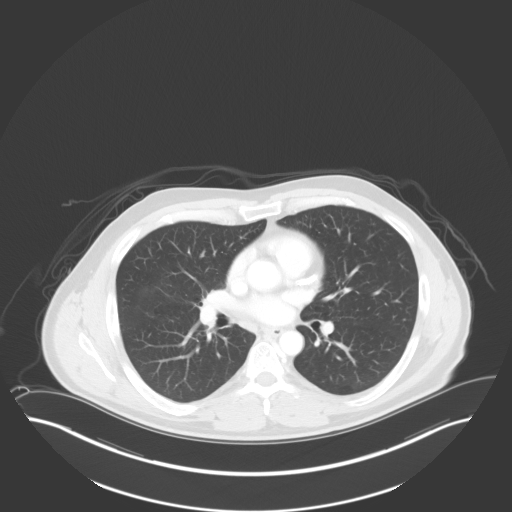
[im 74/96  soft-tissue]
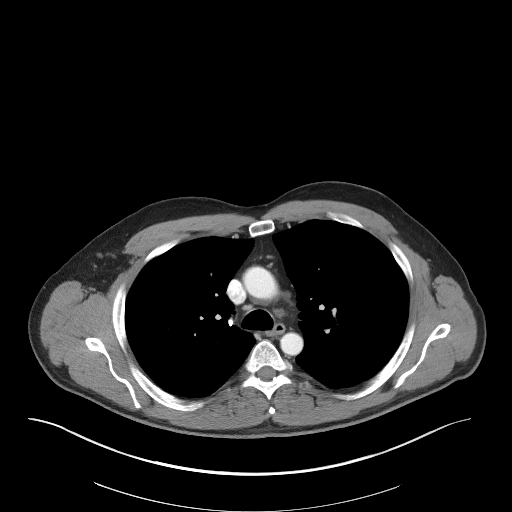
[im 74/96  lung]
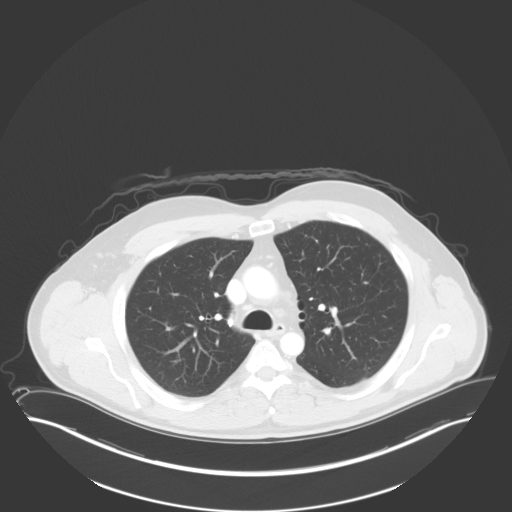
[im 85/96  soft-tissue]
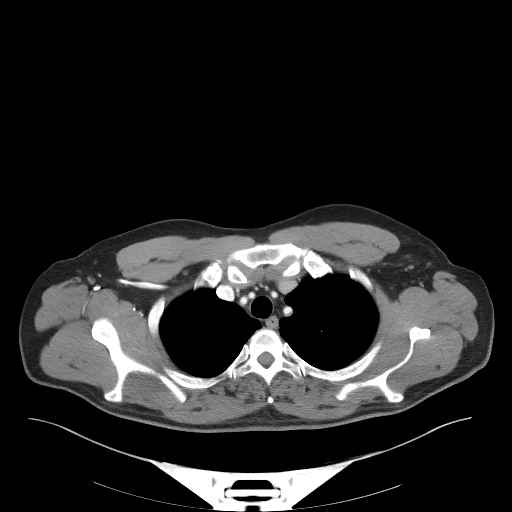
[im 85/96  lung]
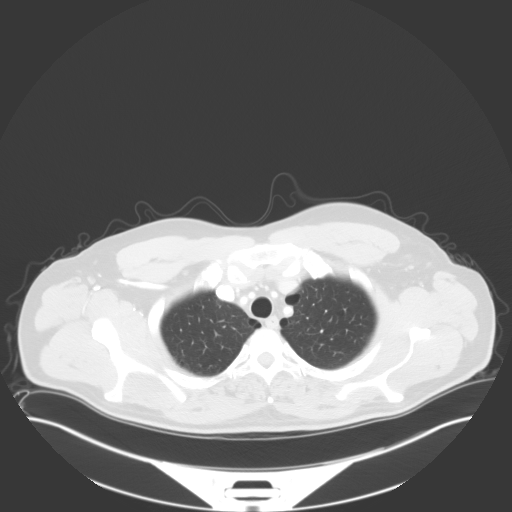

[Series 5: coronals · coronal · 0.84mm/px · 3 of 132 slices shown]
[im 44/132  soft-tissue]
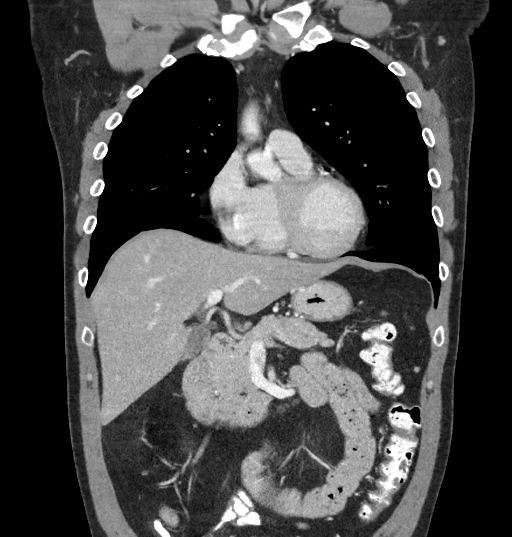
[im 59/132  soft-tissue]
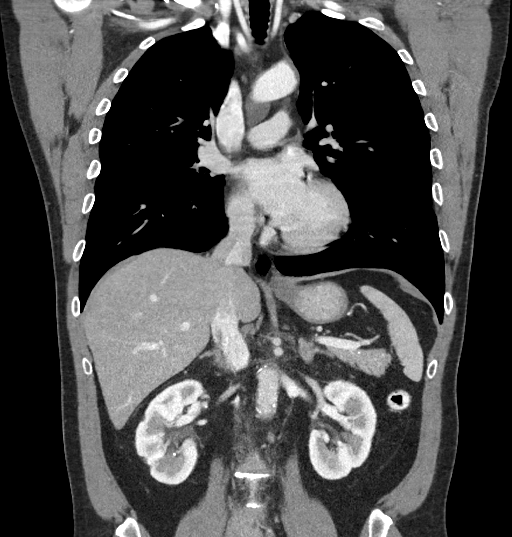
[im 73/132  soft-tissue]
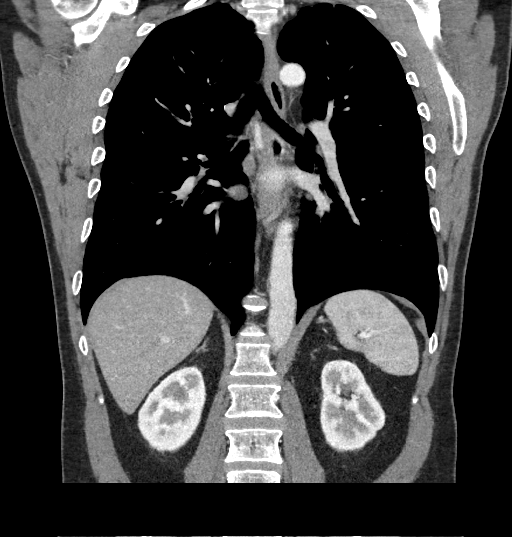

[Series 6: sagittals · sagittal · 0.78mm/px · 1 of 187 slices shown]
[im 63/187  soft-tissue]
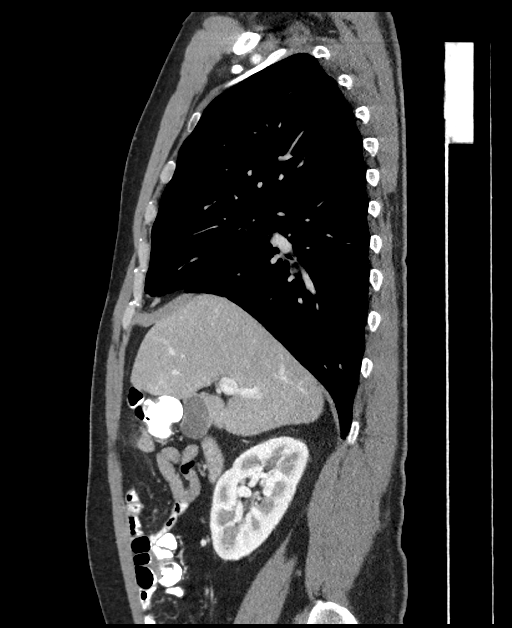

[12 of 46 positions shown; findings below may reference images not displayed]

RADIATION DOSE REDUCTION: This exam was performed according to the
departmental dose-optimization program which includes automated
exposure control, adjustment of the mA and/or kV according to
patient size and/or use of iterative reconstruction technique.

CONTRAST:  100mL OMNIPAQUE IOHEXOL 300 MG/ML  SOLN
FINDINGS: CT CHEST FINDINGS

Cardiovascular: The heart is normal in size and there is no
pericardial effusion. Mild coronary artery calcification is noted.
The aorta and pulmonary trunk are normal in caliber.

Mediastinum/Nodes: No mediastinal, hilar, or axillary
lymphadenopathy. The thyroid gland, trachea, and esophagus are
within normal limits.

Lungs/Pleura: Emphysematous changes are present in the lungs and
apical pleural blebs are noted bilaterally. There is a stable
nodular opacity in the right upper lobe measuring 1.3 x 0.8 cm
associated atelectasis or scarring and pleural retraction. No new
nodule is identified. No effusion or pneumothorax.

Musculoskeletal: No chest wall mass or suspicious bone lesions
identified.

CT ABDOMEN FINDINGS

Hepatobiliary: No focal liver abnormality is seen. Hepatic steatosis
is noted. No gallstones, gallbladder wall thickening, or biliary
dilatation.

Pancreas: Unremarkable. No pancreatic ductal dilatation or
surrounding inflammatory changes.

Spleen: Normal in size without focal abnormality.

Adrenals/Urinary Tract: No adrenal nodule or mass. A nonobstructive
calculus is present in the lower pole the right kidney. No
hydronephrosis bilaterally. Renal cortical scarring is noted
bilaterally.

Stomach/Bowel: Stomach is within normal limits. The appendix is not
seen on exam. No evidence of bowel wall thickening, distention, or
inflammatory changes.

Vascular/Lymphatic: Aortic atherosclerosis. There is ectasia of the
distal abdominal aorta with mural thrombus measuring 2.2 cm. No
enlarged abdominal or pelvic lymph nodes.

Other: Small fat containing umbilical hernia.  No free fluid.

Musculoskeletal: Mild degenerative changes in the lumbar spine.
IMPRESSION: 1. 1.3 x 0.8 cm spiculated nodule in the right middle lobe, not
significantly changed from the prior exam. No new pulmonary nodule
or mass is seen.
2. Aortic atherosclerosis with mild coronary artery calcifications.
3. Emphysema.
4. Nonobstructive right renal calculus.
5. Hepatic steatosis.

## 2023-10-31 IMAGING — CT CT CHEST W/ CM
2 of 5 series · 11 of 36 positions shown, 13 images · IV contrast (OMNIPAQUE)
Comparison: 05/19/2021.

CLINICAL DATA: Lung cancer, history of chemotherapy and
radiotherapy. History of testicular cancer.

EXAM:
CT CHEST AND ABDOMEN WITH CONTRAST
TECHNIQUE: Multidetector CT imaging of the chest and abdomen was performed
following the standard protocol during bolus administration of
intravenous contrast.

[Series 3: ca with · axial · 0.91mm/px · z∈[-684,-314]mm · 8 of 96 slices shown, 10 images]
[im 11/96  mediastinal]
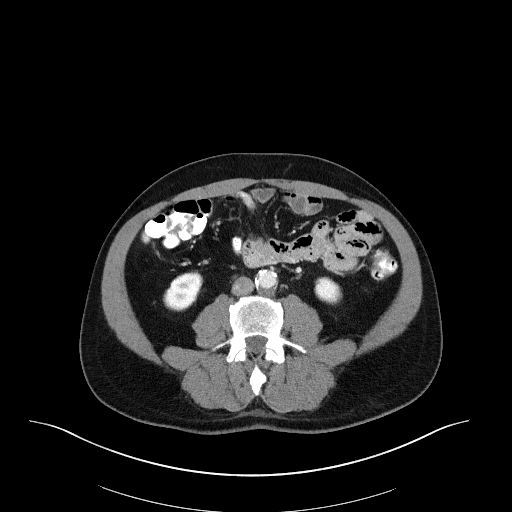
[im 11/96  lung]
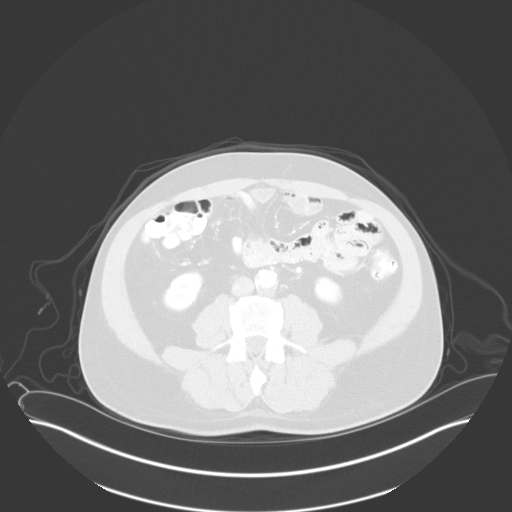
[im 22/96  lung]
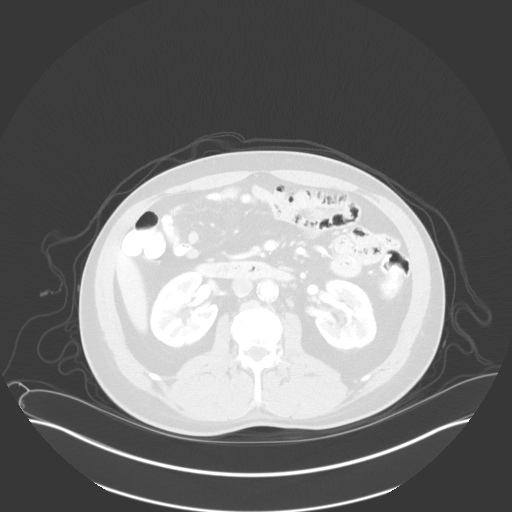
[im 32/96  lung]
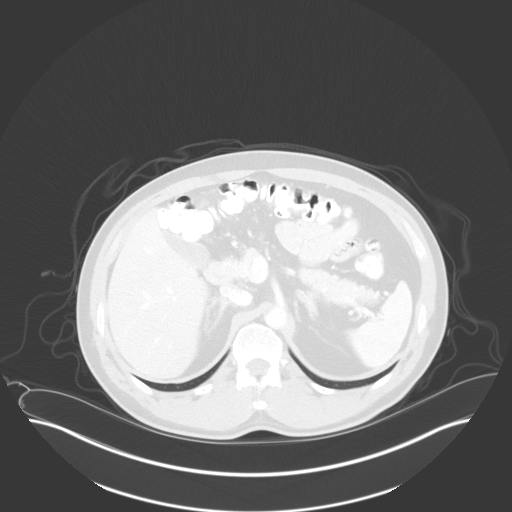
[im 43/96  lung]
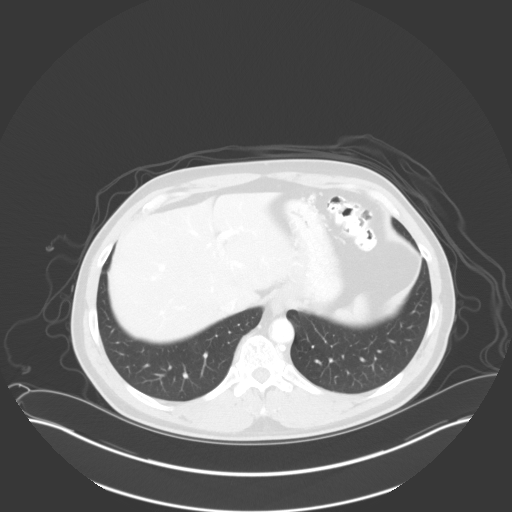
[im 53/96  mediastinal]
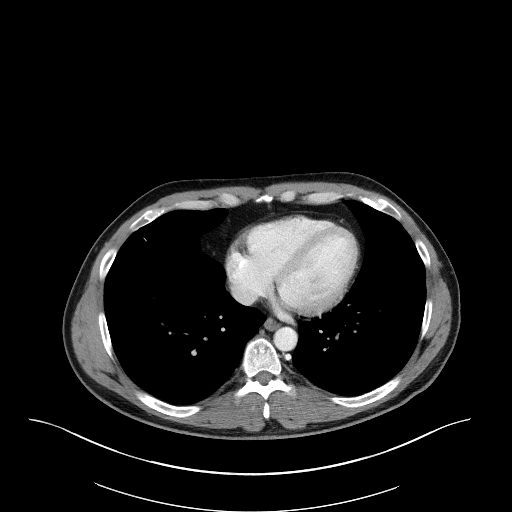
[im 53/96  lung]
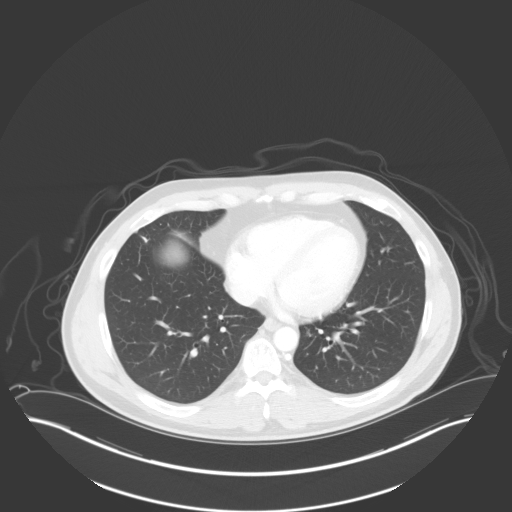
[im 64/96  lung]
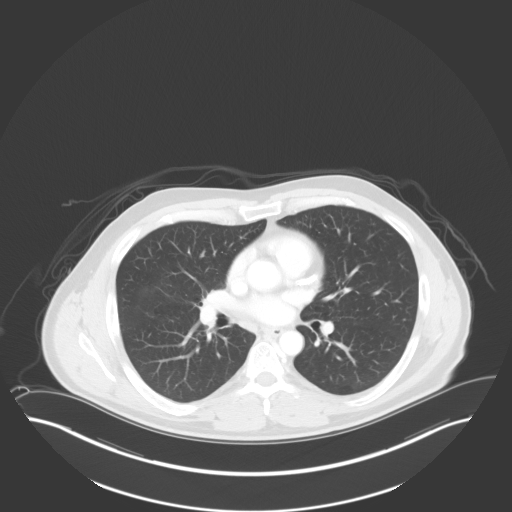
[im 74/96  lung]
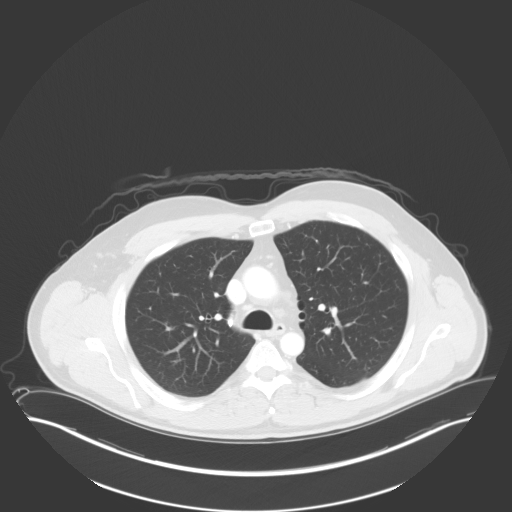
[im 85/96  lung]
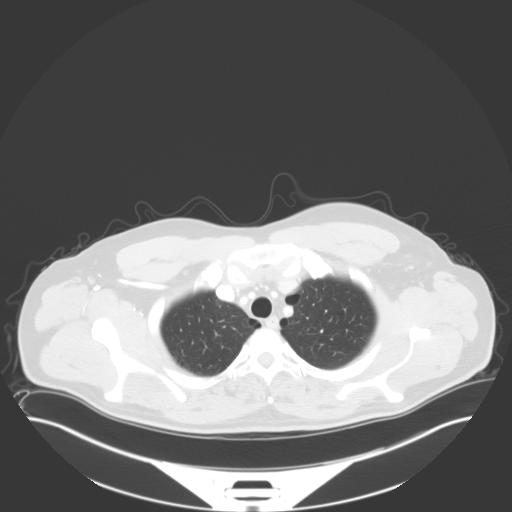

[Series 5: coronals · coronal · 0.84mm/px · 3 of 132 slices shown]
[im 27/132  lung]
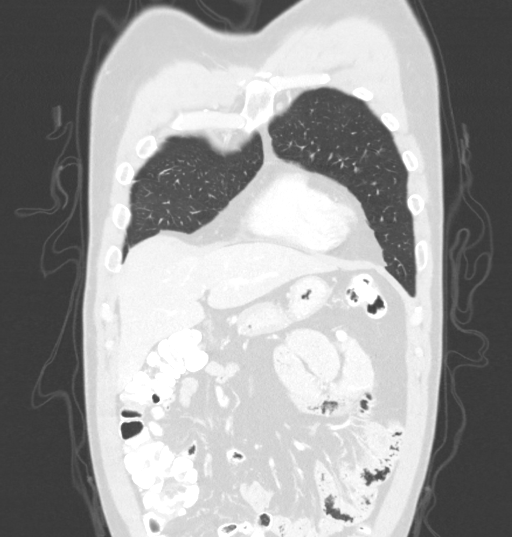
[im 53/132  lung]
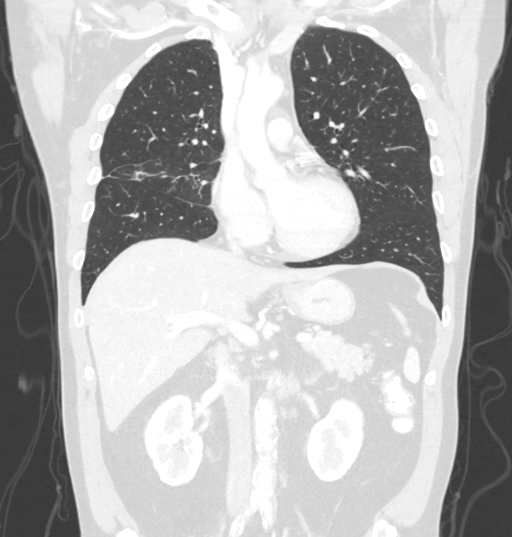
[im 79/132  lung]
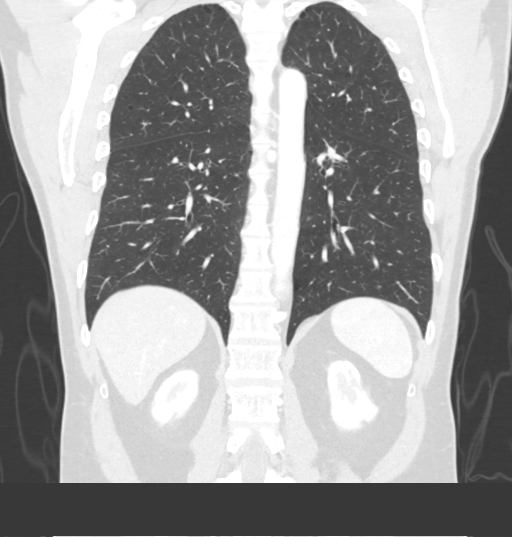

[11 of 36 positions shown; findings below may reference images not displayed]

RADIATION DOSE REDUCTION: This exam was performed according to the
departmental dose-optimization program which includes automated
exposure control, adjustment of the mA and/or kV according to
patient size and/or use of iterative reconstruction technique.

CONTRAST:  100mL OMNIPAQUE IOHEXOL 300 MG/ML  SOLN
FINDINGS: CT CHEST FINDINGS

Cardiovascular: The heart is normal in size and there is no
pericardial effusion. Mild coronary artery calcification is noted.
The aorta and pulmonary trunk are normal in caliber.

Mediastinum/Nodes: No mediastinal, hilar, or axillary
lymphadenopathy. The thyroid gland, trachea, and esophagus are
within normal limits.

Lungs/Pleura: Emphysematous changes are present in the lungs and
apical pleural blebs are noted bilaterally. There is a stable
nodular opacity in the right upper lobe measuring 1.3 x 0.8 cm
associated atelectasis or scarring and pleural retraction. No new
nodule is identified. No effusion or pneumothorax.

Musculoskeletal: No chest wall mass or suspicious bone lesions
identified.

CT ABDOMEN FINDINGS

Hepatobiliary: No focal liver abnormality is seen. Hepatic steatosis
is noted. No gallstones, gallbladder wall thickening, or biliary
dilatation.

Pancreas: Unremarkable. No pancreatic ductal dilatation or
surrounding inflammatory changes.

Spleen: Normal in size without focal abnormality.

Adrenals/Urinary Tract: No adrenal nodule or mass. A nonobstructive
calculus is present in the lower pole the right kidney. No
hydronephrosis bilaterally. Renal cortical scarring is noted
bilaterally.

Stomach/Bowel: Stomach is within normal limits. The appendix is not
seen on exam. No evidence of bowel wall thickening, distention, or
inflammatory changes.

Vascular/Lymphatic: Aortic atherosclerosis. There is ectasia of the
distal abdominal aorta with mural thrombus measuring 2.2 cm. No
enlarged abdominal or pelvic lymph nodes.

Other: Small fat containing umbilical hernia.  No free fluid.

Musculoskeletal: Mild degenerative changes in the lumbar spine.
IMPRESSION: 1. 1.3 x 0.8 cm spiculated nodule in the right middle lobe, not
significantly changed from the prior exam. No new pulmonary nodule
or mass is seen.
2. Aortic atherosclerosis with mild coronary artery calcifications.
3. Emphysema.
4. Nonobstructive right renal calculus.
5. Hepatic steatosis.

## 2023-11-08 ENCOUNTER — Telehealth (HOSPITAL_COMMUNITY): Payer: Self-pay | Admitting: Licensed Clinical Social Worker

## 2023-11-08 NOTE — Telephone Encounter (Signed)
 H&V Care Navigation CSW Progress Note  Clinical Social Worker called pt to get updates regarding progress with resources.  Pt reports he has not follow up with community dental clinics at this time.  CSW informed pt that I was informed he would not qualify for full Medicaid given his income- once you get Medicare you apparently no longer qualify for Medicaid under expansion income guidelines so he is now over income limit.  Pt will plan to follow up with local free or sliding scale clinics regarding assistance.  Encouraged pt to reach out with any other questions or concerns  SDOH Screenings   Depression (PHQ2-9): Low Risk  (08/19/2021)  Financial Resource Strain: Medium Risk (10/22/2023)  Social Connections: Unknown (12/08/2021)   Received from [facility]  Tobacco Use: High Risk (09/22/2023)   Denton Flakes, LCSW Clinical Social Worker [facility] Desk#: [phone] Cell#: [phone]

## 2023-12-07 ENCOUNTER — Other Ambulatory Visit: Payer: Self-pay

## 2023-12-07 ENCOUNTER — Encounter (HOSPITAL_COMMUNITY): Payer: Self-pay | Admitting: Pharmacy Technician

## 2023-12-07 ENCOUNTER — Emergency Department (HOSPITAL_COMMUNITY)
Admission: EM | Admit: 2023-12-07 | Discharge: 2023-12-07 | Disposition: A | Discharge status: Home / Self Care | Attending: Emergency Medicine | Admitting: Emergency Medicine

## 2023-12-07 DIAGNOSIS — N481 Balanitis: Secondary | ICD-10-CM | POA: Diagnosis not present

## 2023-12-07 DIAGNOSIS — F17200 Nicotine dependence, unspecified, uncomplicated: Secondary | ICD-10-CM | POA: Insufficient documentation

## 2023-12-07 DIAGNOSIS — H1089 Other conjunctivitis: Secondary | ICD-10-CM | POA: Insufficient documentation

## 2023-12-07 DIAGNOSIS — B9689 Other specified bacterial agents as the cause of diseases classified elsewhere: Secondary | ICD-10-CM | POA: Diagnosis not present

## 2023-12-07 DIAGNOSIS — E039 Hypothyroidism, unspecified: Secondary | ICD-10-CM | POA: Insufficient documentation

## 2023-12-07 DIAGNOSIS — H1032 Unspecified acute conjunctivitis, left eye: Secondary | ICD-10-CM

## 2023-12-07 DIAGNOSIS — Z79899 Other long term (current) drug therapy: Secondary | ICD-10-CM | POA: Diagnosis not present

## 2023-12-07 DIAGNOSIS — H5712 Ocular pain, left eye: Secondary | ICD-10-CM | POA: Diagnosis present

## 2023-12-07 MED ORDER — CEPHALEXIN 250 MG PO CAPS
500.0000 mg | ORAL_CAPSULE | Freq: Once | ORAL | Status: AC
  Administered 2023-12-07: 500 mg via ORAL
  Filled 2023-12-07: qty 2

## 2023-12-07 MED ORDER — TETRACAINE HCL 0.5 % OP SOLN
2.0000 [drp] | Freq: Once | OPHTHALMIC | Status: AC
  Administered 2023-12-07: 2 [drp] via OPHTHALMIC
  Filled 2023-12-07: qty 4

## 2023-12-07 MED ORDER — CEPHALEXIN 500 MG PO CAPS
500.0000 mg | ORAL_CAPSULE | Freq: Three times a day (TID) | ORAL | 0 refills | Status: AC
Start: 2023-12-07 — End: 2023-12-12

## 2023-12-07 MED ORDER — ERYTHROMYCIN 5 MG/GM OP OINT
1.0000 | TOPICAL_OINTMENT | Freq: Once | OPHTHALMIC | Status: AC
  Administered 2023-12-07: 1 via OPHTHALMIC
  Filled 2023-12-07: qty 3.5

## 2023-12-07 MED ORDER — FLUORESCEIN SODIUM 1 MG OP STRP
1.0000 | ORAL_STRIP | Freq: Once | OPHTHALMIC | Status: AC
Start: 2023-12-07 — End: 2023-12-07
  Administered 2023-12-07: 1 via OPHTHALMIC
  Filled 2023-12-07: qty 1

## 2023-12-07 MED ORDER — FLUCONAZOLE 100 MG PO TABS
100.0000 mg | ORAL_TABLET | Freq: Once | ORAL | Status: AC
  Administered 2023-12-07: 100 mg via ORAL
  Filled 2023-12-07: qty 1

## 2023-12-07 NOTE — Discharge Instructions (Addendum)
 As discussed, your evaluation today has been largely reassuring.  But, it is important that you monitor your condition carefully, and do not hesitate to return to the ED if you develop new, or concerning changes in your condition.  Otherwise, please follow-up with your physician for appropriate ongoing care.  Please continue to use the erythromycin provided today as instructed by the nursing staff.

## 2023-12-07 NOTE — ED Triage Notes (Signed)
 Pt here with L eye redness and swelling onset yesterday. Endorses drainage from eye. Pt also with groin swelling and redness for the last few days.

## 2023-12-07 NOTE — ED Provider Notes (Signed)
 [facility] AT [facility] Provider Note   CSN: [id number] Arrival date & time: 12/07/23  1914     History  No chief complaint on file.   Artrell Marwan Kissell is a 53 y.o. male.  HPI Ronaldo Paras Tellman is a 53 y.o. 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  He notes that he has intermittently had episodes of swelling in the distal penis for some time, hypothesizes that after touching an irritated area then touched the eye, now has redness in his left eye.  No vision changes, no pain with eye motion, but the skin around the eye itself is irritated.  No fever, chills, nausea, vomiting or other complaints.  No dysuria.    Home Medications Prior to Admission medications   Medication Sig Start Date End Date Taking? Authorizing Provider  cephALEXin (KEFLEX) 500 MG capsule Take 1 capsule (500 mg total) by mouth 3 (three) times daily for 5 days. 12/07/23 12/12/23 Yes Dorenda Gandy, MD  atorvastatin  (LIPITOR ) 10 MG tablet Take 1 tablet (10 mg total) by mouth daily. 02/24/23 09/22/23  Knox Perl, MD  BAYER LOW DOSE 81 MG chewable tablet Chew 81 mg by mouth daily.    [provider]  ezetimibe  (ZETIA ) 10 MG tablet Take 1 tablet (10 mg total) by mouth daily. 02/24/23 09/22/23  Knox Perl, MD  levothyroxine  (SYNTHROID ) 125 MCG tablet Take 125 mcg by mouth daily before breakfast.    [provider]  nitroGLYCERIN  (NITROSTAT ) 0.4 MG SL tablet Place 1 tablet (0.4 mg total) under the tongue every 5 (five) minutes as needed for chest pain. 07/02/21   Cantwell,  Celeste C, PA-C  sildenafil  (VIAGRA ) 50 MG tablet Take 1 tablet (50 mg total) by mouth daily as needed for erectile dysfunction. 1/2 tab to 1 tab as needed daily 08/16/23   Ganji, Jay, MD  TABRECTA 200 MG tablet Take 400 mg by mouth in the morning and at bedtime. 07/01/21   [provider]  Vitamin D , Ergocalciferol , (DRISDOL ) 1.25 MG (50000 UNIT) CAPS capsule Take 1 capsule (50,000 Units total) by mouth every 7 (seven) days. 02/24/23   Knox Perl, MD      Allergies    Patient has no known allergies.    Review of Systems   Review of Systems  Physical Exam Updated Vital Signs BP 114/76 (BP Location: Left Arm)   Pulse 61   Temp (!) 97.4 F (36.3 C)   Resp 18   SpO2 100%  Physical Exam Vitals and nursing note reviewed.  Constitutional:      General: He is not in acute distress.    Appearance: He is well-developed.  HENT:     Head: Normocephalic and atraumatic.  Eyes:     Extraocular Movements:     Right eye: Normal extraocular motion and no nystagmus.     Left eye: Normal extraocular motion and no nystagmus.     Conjunctiva/sclera:     Left eye: Left conjunctiva is injected. No chemosis, exudate or hemorrhage.  Pulmonary:     Effort: Pulmonary effort is normal. No respiratory distress.     Breath sounds: No  stridor.  Abdominal:     General: There is no distension.  Genitourinary:   Skin:    General: Skin is warm and dry.  Neurological:     Mental Status: He is alert and oriented to person, place, and time.     ED Results / Procedures / Treatments   Labs (all labs ordered are listed, but only abnormal results are displayed) Labs Reviewed - No data to display  EKG None  Radiology No results found.  Procedures Procedures    Medications Ordered in ED Medications  cephALEXin (KEFLEX) capsule 500 mg (has no administration in time range)  erythromycin ophthalmic ointment 1 Application (has no administration in time range)  fluconazole (DIFLUCAN) tablet  100 mg (has no administration in time range)  tetracaine (PONTOCAINE) 0.5 % ophthalmic solution 2 drop (2 drops Left Eye Given 12/07/23 1133)  fluorescein ophthalmic strip 1 strip (1 strip Left Eye Given 12/07/23 1133)    ED Course/ Medical Decision Making/ A&P                                 Medical Decision Making Adult male presents with concern of genital and facial discomfort.  Suspicion for infection, mild cellulitic changes no evidence for bacteremia, sepsis.  Patient has no eyeball pain, low suspicion for corneal abrasion, conjunctivitis more likely. Patient started antibiotics, topical, oral, as well as a single dose antifungal for consideration of coinfection in the genitals.  Patient will follow-up with urology, primary care, ophthalmology if needed.  Amount and/or Complexity of Data Reviewed External Data Reviewed: notes.    Details: History of malignancy, included above in HPI  Risk Prescription drug management.  Final Clinical Impression(s) / ED Diagnoses Final diagnoses:  Balanitis  Acute bacterial conjunctivitis of left eye    Rx / DC Orders ED Discharge Orders          Ordered    cephALEXin (KEFLEX) 500 MG capsule  3 times daily        12/07/23 1332              Dorenda Gandy, MD 12/07/23 1332

## 2023-12-13 ENCOUNTER — Encounter: Payer: Medicaid (Managed Care) | Attending: Critical Care Medicine | Primary: Family Medicine

## 2023-12-16 ENCOUNTER — Encounter: Payer: Medicaid (Managed Care) | Attending: Family | Primary: Family Medicine

## 2023-12-17 ENCOUNTER — Encounter: Payer: Medicaid (Managed Care) | Attending: Critical Care Medicine | Primary: Family Medicine

## 2023-12-30 ENCOUNTER — Inpatient Hospital Stay: Admit: 2023-12-30 | Payer: Medicaid (Managed Care) | Attending: Critical Care Medicine | Primary: Family Medicine

## 2023-12-30 DIAGNOSIS — R918 Other nonspecific abnormal finding of lung field: Secondary | ICD-10-CM

## 2024-01-06 ENCOUNTER — Ambulatory Visit
Admit: 2024-01-06 | Discharge: 2024-01-06 | Payer: Medicaid (Managed Care) | Attending: Family | Primary: Family Medicine

## 2024-01-06 VITALS — BP 145/83 | HR 76 | Temp 98.20000°F | Ht 73.0 in | Wt 217.8 lb

## 2024-01-06 DIAGNOSIS — R918 Other nonspecific abnormal finding of lung field: Secondary | ICD-10-CM

## 2024-01-06 MED ORDER — TRELEGY ELLIPTA 100-62.5-25 MCG/ACT IN AEPB
100-62.5-25 | Freq: Every day | RESPIRATORY_TRACT | 11 refills | 30.00000 days | Status: DC
Start: 2024-01-06 — End: 2024-07-07

## 2024-01-06 NOTE — Progress Notes (Signed)
 Pulmonary and Sleep Medicine    Danny Ruiz (DOB:  02-Oct-1970) is a 53 y.o. male,Established patient, here for evaluation of the following chief complaint(s):  6 Month Follow-Up (Chest CT 12/30/23)      Referring physician:  No referring provider defined for this encounter.     ASSESSMENT/PLAN:  1. Lung nodules  -     CT CHEST WO CONTRAST; Future  2. Single subsegmental pulmonary embolism without acute cor pulmonale (HCC)  3. Gastroesophageal reflux disease, unspecified whether esophagitis present  4. Cigarette nicotine  dependence without complication  5. Stage 1 mild COPD by GOLD classification (HCC)  -     fluticasone -umeclidin-vilant (TRELEGY ELLIPTA ) 100-62.5-25 MCG/ACT AEPB inhaler; Inhale 1 puff into the lungs daily, Disp-1 each, R-11Normal  6. Mild persistent asthma, unspecified whether complicated  -     fluticasone -umeclidin-vilant (TRELEGY ELLIPTA ) 100-62.5-25 MCG/ACT AEPB inhaler; Inhale 1 puff into the lungs daily, Disp-1 each, R-11Normal        Repeat CT in 6 months to monitor lung nodules. Change Breo to Trelegy.     Smoking Cessation:    Smoking cessation discussed with the patient for 3.5 minutes. Discussion included harmful effects of smoking including increased risk of cancer and cardiovascular events. Smoking cessation strategies were also discussed including but limited to nicotine  replacement and smoking cessation classes. The patient is ready for smoking cessation.     The following smoking cessation strategy implemented:  None, the patient wants to quit without any further assistance                   Return in about 6 months (around 07/07/2024).    SUBJECTIVE/OBJECTIVE:        HPI    Patient presents for follow up lung nodules and COPD. My interpretation of CT images is that lung nodules have not changed. They appear to have been present since March 2024. He continues Engineer, materials. He was given a sample of Trelegy and felt that helped significantly. He continues to smoke and is working to quit.  He is down to about 0.5 PPD.     Continue the following medications as reported by the patient:    Prior to Visit Medications    Medication Sig Taking? Authorizing Provider   apixaban  (ELIQUIS ) 5 MG TABS tablet Take 1 tablet by mouth 2 times daily Yes Fidel Huddle, MD   famotidine  (PEPCID ) 40 MG tablet Take 1 tablet by mouth every evening Yes Fidel Huddle, MD   fluticasone  furoate-vilanterol (BREO ELLIPTA ) 100-25 MCG/ACT inhaler Inhale 1 puff into the lungs daily Yes Fidel Huddle, MD   simvastatin  (ZOCOR ) 40 MG tablet Take 1 tablet by mouth nightly Yes Fidel Huddle, MD   albuterol  sulfate HFA (VENTOLIN  HFA) 108 (90 Base) MCG/ACT inhaler Inhale 2 puffs into the lungs every 6 hours as needed for Wheezing Yes Fidel Huddle, MD   ondansetron  (ZOFRAN -ODT) 4 MG disintegrating tablet Take 1 tablet by mouth 3 times daily as needed for Nausea or Vomiting Yes Fidel Huddle, MD   Glucosamine-Chondroitin (GLUCOSAMINE CHONDR COMPLEX PO) Take by mouth in the morning and at bedtime Yes [provider]   acetaminophen  (TYLENOL ) 325 MG tablet Take 2 tablets by mouth every 6 hours as needed for Pain Yes [provider]        Review of Systems   Constitutional: Negative.    HENT: Negative.     Eyes: Negative.    Respiratory:  Positive for cough and shortness of breath. Negative for apnea.  Cardiovascular: Negative.    Gastrointestinal: Negative.    Musculoskeletal: Negative.    Skin: Negative.    Allergic/Immunologic: Negative.    Neurological: Negative.    Psychiatric/Behavioral: Negative.         Vitals:    01/06/24 1502   BP: (!) 145/83   Pulse: 76   Temp: 98.2 F (36.8 C)   TempSrc: Temporal   SpO2: 94%   Weight: 98.8 kg (217 lb 12.8 oz)   Height: 1.854 m (6' 1)      BMI Readings from Last 1 Encounters:   01/06/24 28.74 kg/m         Physical Exam  Constitutional:       Appearance: Normal appearance.   HENT:      Head: Normocephalic.      Nose: Nose normal.      Mouth/Throat:      Mouth: Mucous membranes  are moist.   Eyes:      Conjunctiva/sclera: Conjunctivae normal.   Cardiovascular:      Rate and Rhythm: Normal rate and regular rhythm.   Pulmonary:      Effort: Pulmonary effort is normal.      Breath sounds: Wheezing present. No rales.   Skin:     General: Skin is warm and dry.   Neurological:      General: No focal deficit present.      Mental Status: He is alert.   Psychiatric:         Mood and Affect: Mood normal.         Behavior: Behavior normal.         Thought Content: Thought content normal.         Judgment: Judgment normal.             This note was generated using a voice recognition software. Errors in voice recognition may have occurred.      An electronic signature was used to authenticate this note.    --Almeta Arm, APRN - CNP

## 2024-02-07 ENCOUNTER — Emergency Department (HOSPITAL_COMMUNITY)

## 2024-02-07 ENCOUNTER — Emergency Department (HOSPITAL_COMMUNITY): Admission: EM | Admit: 2024-02-07 | Discharge: 2024-02-08 | Discharge status: Against Medical Advice

## 2024-02-07 ENCOUNTER — Other Ambulatory Visit: Payer: Self-pay

## 2024-02-07 ENCOUNTER — Encounter (HOSPITAL_COMMUNITY): Payer: Self-pay

## 2024-02-07 ENCOUNTER — Telehealth: Payer: Self-pay | Admitting: Cardiology

## 2024-02-07 DIAGNOSIS — Z5321 Procedure and treatment not carried out due to patient leaving prior to being seen by health care provider: Secondary | ICD-10-CM | POA: Insufficient documentation

## 2024-02-07 DIAGNOSIS — R109 Unspecified abdominal pain: Secondary | ICD-10-CM | POA: Insufficient documentation

## 2024-02-07 DIAGNOSIS — R079 Chest pain, unspecified: Secondary | ICD-10-CM | POA: Diagnosis present

## 2024-02-07 LAB — TROPONIN I (HIGH SENSITIVITY)
Troponin I (High Sensitivity): 4 ng/L (ref <18)
Troponin I (High Sensitivity): 3 ng/L (ref <18)

## 2024-02-07 LAB — CBC
HCT: 46.8 % (ref 39.0–52.0)
Hemoglobin: 15.6 g/dL (ref 13.0–17.0)
MCH: 30.9 pg (ref 26.0–34.0)
MCHC: 33.3 g/dL (ref 30.0–36.0)
MCV: 92.7 fL (ref 80.0–100.0)
Platelets: 232 K/uL (ref 150–400)
RBC: 5.05 MIL/uL (ref 4.22–5.81)
RDW: 13.8 % (ref 11.5–15.5)
WBC: 7.7 K/uL (ref 4.0–10.5)
nRBC: 0 % (ref 0.0–0.2)

## 2024-02-07 LAB — RESP PANEL BY RT-PCR (RSV, FLU A&B, COVID)  RVPGX2
Influenza A by PCR: NEGATIVE
Influenza B by PCR: NEGATIVE
Resp Syncytial Virus by PCR: NEGATIVE
SARS Coronavirus 2 by RT PCR: NEGATIVE

## 2024-02-07 LAB — BASIC METABOLIC PANEL WITH GFR
Anion gap: 11 (ref 5–15)
BUN: 22 mg/dL — ABNORMAL HIGH (ref 6–20)
CO2: 22 mmol/L (ref 22–32)
Calcium: 9 mg/dL (ref 8.9–10.3)
Chloride: 105 mmol/L (ref 98–111)
Creatinine, Ser: 1.42 mg/dL — ABNORMAL HIGH (ref 0.61–1.24)
GFR, Estimated: 59 mL/min — ABNORMAL LOW (ref >60)
Glucose, Bld: 95 mg/dL (ref 70–99)
Potassium: 4.7 mmol/L (ref 3.5–5.1)
Sodium: 138 mmol/L (ref 135–145)

## 2024-02-07 LAB — LIPASE, BLOOD: Lipase: 41 U/L (ref 11–51)

## 2024-02-07 MED ORDER — IOHEXOL 350 MG/ML SOLN
100.0000 mL | Freq: Once | INTRAVENOUS | Status: AC | PRN
  Administered 2024-02-07: 100 mL via INTRAVENOUS

## 2024-02-07 NOTE — ED Provider Triage Note (Signed)
 Emergency Medicine Provider Triage Evaluation Note  Jacob [name] , a 53 y.o. male  was evaluated in triage.  Pt complains of chest pain.  Symptoms have been ongoing for about a week but initially began as abdominal pain and has since moved into his chest with some radiation to his left shoulder.  He was in the parking lot getting ready to leave after deciding he did not want to come to the emergency department when he began to feel short of breath/diaphoretic and like he may pass out, was brought to the ED by rapid response nurse.  History of STEMI, history of AAA.  He is not on aspirin .  Review of Systems  Positive: As above Negative: As above  Physical Exam  BP (!) 136/105 (BP Location: Right Arm)   Pulse (!) 102   Resp 20   Ht 5' 7.5 (1.715 m)   Wt 86.2 kg   SpO2 100%   BMI 29.32 kg/m  Gen:   Awake, no distress   Resp:  Normal effort, lungs clear to auscultation however patient with deep cough intermittently MSK:   Moves extremities without difficulty  Other:  Abdomen is soft and nontender to palpation. A&O x3  Medical Decision Making  Medically screening exam initiated at 4:46 PM.  Appropriate orders placed.  Jacob [name] was informed that the remainder of the evaluation will be completed by another provider, this initial triage assessment does not replace that evaluation, and the importance of remaining in the ED until their evaluation is complete.     Jacob [name], [place] 02/07/24 1649

## 2024-02-07 NOTE — Telephone Encounter (Signed)
 Noted, pt going to ED. Forwarding to provider as RICK.

## 2024-02-07 NOTE — Telephone Encounter (Signed)
 Patient noted he had been diagnosed with an abdominal aortic aneurysm.  Patient stated for the last week he has been having a pain in the middle of his abdomen, burning in the upper part of his mouth down to abdomen.  Patient noted his HR and BP is elevated and feels like he is going to fall down and throw up.  Patient stated he is going to ER.

## 2024-02-07 NOTE — ED Notes (Signed)
 After attempting to encourage pt to wait and be seen, pt decided they preferred to leave d/t wait time.

## 2024-02-07 NOTE — ED Triage Notes (Signed)
 Pt c/o abdominal pain x 4-5 days, today in the last hour, pain has started in his chest upper abdominal that is radiating towards his shoulders and left arm, is nauseous and weakness, pt appears pale and diaphoretic and started coughing. Pt states that he was diagnosed with aneurysm previous.

## 2024-02-12 ENCOUNTER — Encounter

## 2024-02-14 MED ORDER — FAMOTIDINE 40 MG PO TABS
40 | ORAL_TABLET | Freq: Every evening | ORAL | 3 refills | 30.00000 days | Status: DC
Start: 2024-02-14 — End: 2024-06-26

## 2024-02-25 ENCOUNTER — Other Ambulatory Visit: Payer: Self-pay

## 2024-02-25 DIAGNOSIS — E7800 Pure hypercholesterolemia, unspecified: Secondary | ICD-10-CM

## 2024-02-25 DIAGNOSIS — I25118 Atherosclerotic heart disease of native coronary artery with other forms of angina pectoris: Secondary | ICD-10-CM

## 2024-02-25 MED ORDER — EZETIMIBE 10 MG PO TABS: 10.0000 mg | ORAL_TABLET | Freq: Every day | ORAL | 1 refills | Status: AC

## 2024-03-02 ENCOUNTER — Other Ambulatory Visit (HOSPITAL_COMMUNITY): Payer: Self-pay | Admitting: Internal Medicine

## 2024-03-02 DIAGNOSIS — C3491 Malignant neoplasm of unspecified part of right bronchus or lung: Secondary | ICD-10-CM

## 2024-03-08 ENCOUNTER — Other Ambulatory Visit (HOSPITAL_COMMUNITY): Payer: Self-pay | Admitting: Internal Medicine

## 2024-03-08 DIAGNOSIS — C3491 Malignant neoplasm of unspecified part of right bronchus or lung: Secondary | ICD-10-CM

## 2024-03-10 ENCOUNTER — Ambulatory Visit
Admit: 2024-03-10 | Discharge: 2024-03-10 | Payer: Medicaid (Managed Care) | Attending: Family Medicine | Primary: Family Medicine

## 2024-03-10 VITALS — BP 108/62 | HR 83 | Temp 97.50000°F | Resp 18 | Ht 73.0 in | Wt 215.6 lb

## 2024-03-10 DIAGNOSIS — R7303 Prediabetes: Principal | ICD-10-CM

## 2024-03-10 LAB — CBC WITH AUTO DIFFERENTIAL
Basophils %: 0.6 % (ref 0.0–1.0)
Basophils Absolute: 0 K/uL (ref 0.00–0.20)
Eosinophils %: 3.6 % (ref 0.0–5.0)
Eosinophils Absolute: 0.3 K/uL (ref 0.00–0.60)
Hematocrit: 46.4 % (ref 42.0–52.0)
Hemoglobin: 15.7 g/dL (ref 14.0–18.0)
Immature Granulocytes #: 0 K/uL
Lymphocytes %: 37.7 % (ref 20.0–40.0)
Lymphocytes Absolute: 2.7 K/uL (ref 1.1–4.5)
MCH: 29.9 pg (ref 27.0–31.0)
MCHC: 33.8 g/dL (ref 33.0–37.0)
MCV: 88.4 fL (ref 80.0–94.0)
MPV: 10.1 fL (ref 9.4–12.4)
Monocytes %: 8.7 % (ref 0.0–10.0)
Monocytes Absolute: 0.6 K/uL (ref 0.00–0.90)
Neutrophils %: 49.3 % — ABNORMAL LOW (ref 50.0–65.0)
Neutrophils Absolute: 3.6 K/uL (ref 1.5–7.5)
Platelets: 263 K/uL (ref 130–400)
RBC: 5.25 M/uL (ref 4.70–6.10)
RDW: 12.9 % (ref 11.5–14.5)
WBC: 7.3 K/uL (ref 4.8–10.8)

## 2024-03-10 LAB — LIPID PANEL
Cholesterol, Total: 154 mg/dL (ref 0–199)
HDL: 27 mg/dL — ABNORMAL LOW (ref 40–60)
LDL Cholesterol: 53 mg/dL (ref <100)
Triglycerides: 369 mg/dL — ABNORMAL HIGH (ref 0–149)

## 2024-03-10 LAB — COMPREHENSIVE METABOLIC PANEL
ALT: 14 U/L (ref 10–50)
AST: 20 U/L (ref 10–50)
Albumin: 4.3 g/dL (ref 3.5–5.2)
Alkaline Phosphatase: 72 U/L (ref 40–129)
Anion Gap: 12 mmol/L (ref 8–16)
BUN: 15 mg/dL (ref 6–20)
CO2: 23 mmol/L (ref 22–29)
Calcium: 8.8 mg/dL (ref 8.6–10.0)
Chloride: 105 mmol/L (ref 98–107)
Creatinine: 1.3 mg/dL — ABNORMAL HIGH (ref 0.7–1.2)
Est, Glom Filt Rate: 65 (ref >60)
Glucose: 99 mg/dL (ref 70–99)
Potassium: 4.4 mmol/L (ref 3.5–5.1)
Sodium: 140 mmol/L (ref 136–145)
Total Bilirubin: 0.4 mg/dL (ref 0.2–1.2)
Total Protein: 6.6 g/dL (ref 6.4–8.3)

## 2024-03-10 LAB — HEMOGLOBIN A1C: Hemoglobin A1C: 5.9 % — ABNORMAL HIGH (ref 4.0–5.6)

## 2024-03-10 MED ORDER — SIMVASTATIN 40 MG PO TABS
40 | ORAL_TABLET | Freq: Every evening | ORAL | 1 refills | 90.00000 days | Status: DC
Start: 2024-03-10 — End: 2024-07-07

## 2024-03-10 MED ORDER — APIXABAN 5 MG PO TABS
5 | ORAL_TABLET | Freq: Two times a day (BID) | ORAL | 5 refills | 30.00000 days | Status: DC
Start: 2024-03-10 — End: 2024-07-07

## 2024-03-10 MED ORDER — ALBUTEROL SULFATE HFA 108 (90 BASE) MCG/ACT IN AERS
108 | Freq: Four times a day (QID) | RESPIRATORY_TRACT | 3 refills | 25.00000 days | Status: DC | PRN
Start: 2024-03-10 — End: 2024-07-07

## 2024-03-10 NOTE — Progress Notes (Signed)
 Danny Ruiz (DOB:  03-24-1971) is a 53 y.o. male,Established patient, here for evaluation of the following chief complaint(s):  Follow-up and Other (Digestion issues, sometimes when he eats he instantly has to use the restroom, issues has been going on for a while but gradually getting worse.)      Assessment & Plan     Assessment & Plan  1. Gastrocolic reflux.  - Symptoms suggest a diagnosis of gastrocolic reflux, characterized by an immediate urge to defecate following food intake. The condition appears to be physiological in nature.  - No blood or cramping in stool reported; occasional diarrhea noted.  - A referral to a gastroenterologist will be made for further evaluation and potential endoscopy. Food allergy testing was discussed but declined due to insurance coverage concerns.  - An alpha-gal test will be conducted today to rule out any potential red meat allergies.    2. Prediabetes.  - He is due for his annual lab work, including an A1c test, as it has been a year since the last one.  - Previous A1c indicated prediabetes.  - Lab work will be ordered today.  - Additional tickborne illness testing will be included.    3. Medication management.  - Refills for Trelegy, simvastatin , and albuterol  inhaler have been provided.  - Medication sent to pharmacy.  - No more Breo; switched back to Trelegy.    4. Health maintenance.  - He is overdue for colon cancer screening.  - A referral for a colonoscopy will be made.  - PSA level will be checked again as it was last done a year ago.    ASSESSMENT/PLAN:  1. Prediabetes  -     Hemoglobin A1C; Future  2. Encounter for screening colonoscopy  [facility], [place]  3. Abnormal stools  -     Lipid Panel; Future  -     Comprehensive Metabolic Panel; Future  -     CBC with Auto Differential; Future  -     Allergen, Food, Alpha-Gal Panel, IgE; Future  4. Multiple subsegmental pulmonary emboli without acute cor pulmonale (HCC)  -     apixaban  (ELIQUIS ) 5  MG TABS tablet; Take 1 tablet by mouth 2 times daily, Disp-60 tablet, R-5Normal  5. Stage 1 mild COPD by GOLD classification (HCC)  -     albuterol  sulfate HFA (VENTOLIN  HFA) 108 (90 Base) MCG/ACT inhaler; Inhale 2 puffs into the lungs every 6 hours as needed for Wheezing, Disp-18 g, R-3Normal      Return in about 6 months (around 09/10/2024).         Subjective   SUBJECTIVE/OBJECTIVE:  History of Present Illness  The patient presents for evaluation of gastrocolic reflux and prediabetes.    He has been experiencing worsening symptoms of gastrocolic reflux for the past 6 months, which intensify after consuming large meals or snacks. He reports no abdominal pain but feels an urgent need to use the bathroom. This issue has persisted for a long time and has escalated to the point where it disrupts his sleep and social plans. His diet is low in fiber, and he avoids spicy foods as they exacerbate his condition. He reports no presence of blood in his stool or cramping. He occasionally experiences diarrhea, which he attributes to lactose intolerance. He tolerates chicken well but is unsure about red meat. He has not sought consultation with a gastroenterologist. He has had numerous tick bites in the past, including one instance  where he found 28 ticks on his body and two that were embedded in his belly button for an extended period before removal. He has been avoiding lactose-containing foods such as milk and cheese, suspecting them to be the cause of his discomfort. He occasionally takes Imodium for relief.    He is due for his colon cancer screening. He had his PSA checked last year.    He is also seeking refills for his medications, including Trelegy, simvastatin , and albuterol  inhaler. He has discontinued Breo.    Hobbies: Fishing  Diet: Low in fiber, avoids spicy foods, avoids lactose-containing foods  Sleep: Disrupted by symptoms    HPI    Review of Systems   Constitutional:  Negative for activity change and fever.    HENT:  Negative for congestion.    Eyes:  Negative for visual disturbance.   Respiratory:  Negative for chest tightness, shortness of breath and wheezing.    Cardiovascular:  Negative for chest pain.   Gastrointestinal:  Positive for diarrhea and nausea. Negative for abdominal pain and blood in stool.   Genitourinary:  Negative for difficulty urinating and urgency.   Neurological:  Negative for weakness and headaches.   Psychiatric/Behavioral:  Negative for confusion.           Objective   Physical Exam  Vitals reviewed.   Constitutional:       General: He is not in acute distress.     Appearance: Normal appearance. He is not ill-appearing.   HENT:      Head: Normocephalic and atraumatic.   Cardiovascular:      Rate and Rhythm: Normal rate and regular rhythm.      Pulses: Normal pulses.      Heart sounds: Normal heart sounds. No murmur heard.  Pulmonary:      Effort: Pulmonary effort is normal. No respiratory distress.      Breath sounds: Normal breath sounds. No wheezing or rhonchi.   Chest:      Chest wall: No tenderness.   Abdominal:      General: Abdomen is flat. Bowel sounds are normal. There is no distension.      Palpations: Abdomen is soft.      Tenderness: There is no abdominal tenderness.   Musculoskeletal:         General: No swelling.   Skin:     General: Skin is warm and dry.   Neurological:      General: No focal deficit present.      Mental Status: He is alert.            Vitals:    03/10/24 1432   BP: 108/62   Pulse: 83   Resp: 18   Temp: 97.5 F (36.4 C)   SpO2: 98%        Current Outpatient Medications   Medication Sig Dispense Refill    apixaban  (ELIQUIS ) 5 MG TABS tablet Take 1 tablet by mouth 2 times daily 60 tablet 5    albuterol  sulfate HFA (VENTOLIN  HFA) 108 (90 Base) MCG/ACT inhaler Inhale 2 puffs into the lungs every 6 hours as needed for Wheezing 18 g 3    simvastatin  (ZOCOR ) 40 MG tablet Take 1 tablet by mouth nightly 90 tablet 1    famotidine  (PEPCID ) 40 MG tablet TAKE 1 TABLET BY  MOUTH EVERY EVENING 30 tablet 3    fluticasone -umeclidin-vilant (TRELEGY ELLIPTA ) 100-62.5-25 MCG/ACT AEPB inhaler Inhale 1 puff into the lungs daily 1 each 11  ondansetron  (ZOFRAN -ODT) 4 MG disintegrating tablet Take 1 tablet by mouth 3 times daily as needed for Nausea or Vomiting 21 tablet 0    Glucosamine-Chondroitin (GLUCOSAMINE CHONDR COMPLEX PO) Take by mouth in the morning and at bedtime      acetaminophen  (TYLENOL ) 325 MG tablet Take 2 tablets by mouth every 6 hours as needed for Pain       No current facility-administered medications for this visit.      Family History   Problem Relation Age of Onset    No Known Problems Mother     No Known Problems Father     No Known Problems Brother     No Known Problems Half-Sister     No Known Problems Half-Sister     No Known Problems Daughter     No Known Problems Daughter     No Known Problems Son       Past Medical History:   Diagnosis Date    Asthma     DVT (deep venous thrombosis) (HCC)     had on two occasions in the past    GERD (gastroesophageal reflux disease)     Pulmonary embolism (HCC) 10/01/2022    Tobacco abuse       Past Surgical History:   Procedure Laterality Date    KIDNEY REMOVAL Left     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      Allergies   Allergen Reactions    Nsaids         Results      Lab Results   Component Value Date    NA 140 03/10/2024    K 4.4 03/10/2024    CL 105 03/10/2024    CO2 23 03/10/2024    BUN 15 03/10/2024    CREATININE 1.3 (H) 03/10/2024    GLUCOSE 99 03/10/2024    CALCIUM  8.8 03/10/2024    BILITOT 0.4 03/10/2024    ALKPHOS 72 03/10/2024    AST 20 03/10/2024    ALT 14 03/10/2024    LABGLOM 65 03/10/2024    GLOB 2.7 11/05/2022        Lab Results   Component Value Date    WBC 7.3 03/10/2024    HGB 15.7 03/10/2024    HCT 46.4 03/10/2024    MCV 88.4 03/10/2024    PLT 263 03/10/2024                EMR Dragon/transcription disclaimer:  Much of this encounter note is electronic transcription/translation of spoken language  toprinted texts.  The electronic translation of spoken language may be erroneous, or at times, nonsensical words or phrases may be inadvertently transcribed.  Although I have reviewed the note for such errors, some may stillexist.      An electronic signature was used to authenticate this note.    --Omega Finder, MD

## 2024-03-22 MED ORDER — AZITHROMYCIN 250 MG PO TABS
250 | ORAL_TABLET | ORAL | 0 refills | Status: AC
Start: 2024-03-22 — End: 2024-04-01

## 2024-04-14 MED ORDER — METRONIDAZOLE 500 MG PO TABS
500 | ORAL_TABLET | Freq: Once | ORAL | 0 refills | 7.00000 days | Status: AC
Start: 2024-04-14 — End: 2024-04-14

## 2024-05-11 ENCOUNTER — Encounter

## 2024-05-11 NOTE — Telephone Encounter (Signed)
"  I called patient and reminded patient of their appt on 05/12/2024 and patient confirmed they would be here. I also let patient know that we have moved into our new cancer facility and asked patient if they were aware of where we were now located, and patient voiced understanding of our new location. Patient knows not to arrive any earlier their appointment because we are unable to check patients in early and to come in well hydrated incase labs are needed at any time throughout their visit.  "

## 2024-05-12 ENCOUNTER — Inpatient Hospital Stay: Admit: 2024-05-12 | Discharge: 2024-05-12 | Payer: Medicaid (Managed Care) | Primary: Family Medicine

## 2024-05-12 ENCOUNTER — Ambulatory Visit
Admit: 2024-05-12 | Discharge: 2024-05-12 | Payer: Medicaid (Managed Care) | Attending: Nurse Practitioner | Primary: Family Medicine

## 2024-05-12 LAB — CBC WITH AUTO DIFFERENTIAL
Basophils %: 0.7 % (ref 0.0–1.0)
Basophils Absolute: 0.05 K/uL (ref 0.00–0.20)
Eosinophils %: 6.9 % — ABNORMAL HIGH (ref 0.0–5.0)
Eosinophils Absolute: 0.5 K/uL (ref 0.00–0.60)
Hematocrit: 45.7 % (ref 42.0–52.0)
Hemoglobin: 15.2 g/dL (ref 14.0–18.0)
Lymphocytes %: 45.5 % — ABNORMAL HIGH (ref 20.0–40.0)
Lymphocytes Absolute: 3.28 K/uL (ref 1.10–4.50)
MCH: 29.3 pg (ref 27.0–31.0)
MCHC: 33.3 g/dL (ref 33.0–37.0)
MCV: 88.2 fL (ref 80.0–94.0)
MPV: 9.1 fL — ABNORMAL LOW (ref 9.4–12.4)
Monocytes %: 7.9 % (ref 1.0–10.0)
Monocytes Absolute: 0.57 K/uL (ref 0.00–0.90)
Neutrophils %: 38.9 % — ABNORMAL LOW (ref 50.0–65.0)
Neutrophils Absolute: 2.8 K/uL (ref 1.50–7.50)
Platelets: 289 K/uL (ref 130–400)
RBC: 5.18 M/uL (ref 4.70–6.10)
RDW: 12.9 % (ref 11.5–14.5)
WBC: 7.21 K/uL (ref 4.80–10.80)

## 2024-05-12 LAB — COMPREHENSIVE METABOLIC PANEL
ALT: 9 U/L (ref 5–41)
AST: 17 U/L (ref 5–40)
Albumin: 4.3 g/dL (ref 3.5–5.2)
Alkaline Phosphatase: 81 U/L (ref 40–129)
Anion Gap: 12 mmol/L (ref 7–19)
BUN: 13 mg/dL (ref 6–20)
CO2: 24 mmol/L (ref 22–29)
Calcium: 9.5 mg/dL (ref 8.6–10.0)
Chloride: 103 mmol/L (ref 98–107)
Creatinine: 1.4 mg/dL (ref 0.7–1.2)
Est, Glom Filt Rate: 60 — AB (ref >60)
Glucose: 98 mg/dL (ref 70–99)
Potassium: 4.6 mmol/L (ref 3.5–5.1)
Sodium: 139 mmol/L (ref 136–145)
Total Bilirubin: 0.4 mg/dL (ref 0.0–1.2)
Total Protein: 6.6 g/dL (ref 6.4–8.3)

## 2024-05-12 NOTE — Progress Notes (Signed)
 "               Progress Note      Pt Name: Danny Ruiz  Birthdate: 29-Apr-1971  MRN: [id number]    Date of evaluation: 05/12/2024  History Obtained From:  patient, electronic medical record    CHIEF COMPLAINT:    Chief Complaint   Patient presents with    Follow-up     History of pulmonary embolism         HISTORY OF PRESENT ILLNESS:    Danny Ruiz is a 53 y.o. 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  Thrombophilia workup on 11/05/2022 was unrevealing for any underlying clotting disorders.  Current recommendation is for long-term anticoagulation with Eliquis  5 mg p.o. twice daily.  Danny Ruiz returns today in scheduled follow-up for evaluation, lab monitoring, review lab results and further treatment recommendations.    History of Present Illness  He reports no new health concerns since his last visit. He is tolerating the Eliquis  well, with no instances of bleeding, nosebleeds, or blood in the urine. He does not experience excessive bruising, except when he sustains an injury. He has reduced his smoking habit significantly, now consuming approximately 8 to 10 cigarettes daily, down from 30. He is able to obtain his Eliquis  without any issues. He is just getting over a cold and occasionally experiences a cough, which sometimes produces phlegm, although he is uncertain of its color. He typically consults with Dr. Vannie every 3 to 6 months.      Today's clinic visit to include physical assessment, review of systems, any lab or radiographic findings that were available and plan of care are documented below.    HEMATOLOGIC HISTORY:     Diagnosis  Unprovoked bilateral pulmonary emboli with DVT in the right lower extremity (gastrocnemius vein), March 2024: PESI score: 102 points. Class III, Intermediate Risk: 3.2-7.1% 30-day mortality in this group.   Thrombophilia workup on 11/05/2022 ruled out underlying clotting deficiency  History of provoked PE x 2 treated with Lovenox first episode and Eliquis  second episode x 3 months      Treatment summary:  10/01/2022-patient was started on IV heparin  and transitioned to Eliquis  at discharge.  Recommendation is for lifelong anticoagulation with Eliquis  5 mg p.o. twice daily    Danny Ruiz was seen by Dr Benn on 10/02/2022 for DVT/PE after presenting to the ER department with complaints of shortness of breath for a few weeks.  The patient denies any recent trauma, surgery, prolonged immobilization.  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  He received Lovenox for his first episode and Eliquis  x 3 months for his second episode.  The patient does not have a primary care.  He has not had any age-appropriate cancer screening.  The patient is a daily smoker since age 49.  The patient denies any weight loss.  Denies any hematuria, melena, hematochezia or hematemesis.    10/01/2022-CT pulmonary protocol remarkable for-Multiple filling defects in the pulmonary arteries consistent with thromboemboli in the distal right main pulmonary artery multiple segmental and subsegmental branches of the right upper and middle lobes in the left lower lobe. - Right ventricle/Left ventricle ratio (normal <0.9): Normal. - Main pulmonary artery: Normal caliber.  Lung Parenchyma, Pleura, and Airways: Mild thickening of the peripheral airways and  scattered mucus plugging. No dense airspace consolidation. No suspicious nodule/mass. Dependent atelectasis in the lower lobes.  No pleural effusion or pneumothorax. 1 cm  subpleural nodule at the left lower lobe base on axial image 80, coronal image 83.  Thoracic Inlet, Mediastinum, and Hila: Thyroid gland is unremarkable. No lymphadenopathy.  Heart, Vessels, and Pericardium: Normal caliber aorta. No visible coronary artery calcifications. No cariomegaly. No  pericardial effusion.  Bones and Soft Tissues: No acute osseous abnormality. No mass or adenopathy.  Upper Abdomen: 2 cm cyst noted in the right lobe of the liver.  Post left nephrectomy.  Impression: 1 cm subpleural nodule in the left lower lobe could be post infectious/inflammatory or neoplastic.      09/30/21- 2D echo-normal LVEF 55-60%. No regional wall motion. Mild concentric left ventricular hypertrophy. Normal  diastolic function. Normal right ventricular size with preserved RV function (TAPSE 23 mm).  Normal bi-atrial size.  No clinically significant valvular stenosis or aortic regurgitation. Aortic root and ascending aorta dimensions are within normal limits.  The IVC is normal. The rhythm is sinus.    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  No additional DVT visualized.  No evidence of SVT or reflux noted at this time.    10/01/2022-patient was started on IV heparin .     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    11/03/2022 CTA pulmonary- reported complete or near complete resolution of previously seen pulmonary emboli bilaterally. Question only trace residual right lower lobe pulmonary embolus at a branch point versus artifact. No acute abnormality in the thorax. Scattered bilateral pulmonary nodularity is again noted without significant change measuring up to 5 mm.   Previously seen 1 cm nodular opacity adjacent the left hemidiaphragm in the left lower lobe is no longer visualized and likely represents resolution of an inflammatory process.  Overall improving bronchial wall thickening to the left lower lobe in comparison to prior.     11/05/2022 Serology results  Antithrombin III activity 97%  Prothrombin gene mutation negative  Protein C functional  132%  Activated Protein C Resistance 3.96  Protein S Antigen 111%  Factor 5 Leiden negative    01/27/2023 CTA pulmonary- reported no pulmonary thromboembolism. Unchanged bilateral pulmonary nodes measuring up to 0.5 cm.  According to the Fleischner Society guidelines, in a low risk patient no further follow-up is necessary.  However, in high risk patient obtaining CT chest in 12 months is optional. Unchanged mild right hilar lymphadenopathy, nonspecific but likely reactive. Status post left nephrectomy.     06/02/2023 CTA pulmonary- reported limited study. As seen, no large central PE.  However, limited evaluation of the lobar, segmental and subsegmental pulmonary arteries. Stable pulmonary nodules.  Continued clinical and imaging surveillance. Mild emphysema. Stable right hilar lymph node.  Stable prominent axillary lymph nodes.    12/30/2023 CT chest without contrast- reported stable bilateral lung nodules. No new lung nodules.    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       Current Medications:    Current Outpatient Medications   Medication Sig Dispense Refill    apixaban  (ELIQUIS ) 5 MG TABS tablet Take 1 tablet by mouth 2 times daily 60 tablet 5    albuterol  sulfate HFA (VENTOLIN  HFA) 108 (90 Base) MCG/ACT inhaler Inhale 2 puffs into the lungs every 6 hours as needed for Wheezing 18 g 3    simvastatin  (ZOCOR ) 40 MG tablet Take 1 tablet by mouth nightly 90 tablet 1    famotidine  (PEPCID ) 40 MG tablet TAKE 1 TABLET BY MOUTH EVERY  EVENING 30 tablet 3    fluticasone -umeclidin-vilant (TRELEGY ELLIPTA ) 100-62.5-25 MCG/ACT AEPB inhaler Inhale 1 puff into the lungs daily 1 each 11    ondansetron  (ZOFRAN -ODT) 4 MG disintegrating tablet Take 1 tablet by mouth 3 times daily as needed for Nausea or Vomiting 21 tablet 0    Glucosamine-Chondroitin (GLUCOSAMINE CHONDR COMPLEX PO) Take by mouth in the morning and at bedtime      acetaminophen  (TYLENOL ) 325 MG tablet Take 2 tablets by mouth every 6 hours as needed for Pain       No current facility-administered medications for this visit.        Allergies:   Allergies   Allergen Reactions    Nsaids        Social History:    Social History     Tobacco Use    Smoking status: Every Day     Current packs/day: 1.50     Average packs/day: 1.5 packs/day for 40.8 years (61.2 ttl pk-yrs)     Types: Cigarettes     Start date: 68    Smokeless tobacco: Never    Tobacco comments:     Started at age 28, has had a PPD on avrg since he began, smoked 40 pack-years   Vaping Use    Vaping status: Every Day    Substances: Nicotine     Devices: Disposable   Substance Use Topics    Alcohol use: Not Currently    Drug use: Not Currently     Comment: tried MJ a few times in the past       Family History:   Family History   Problem Relation Age of Onset    No Known Problems Mother     No Known Problems Father     No Known Problems Brother     No Known Problems Half-Sister     No Known Problems Half-Sister     No Known Problems Daughter     No Known Problems Daughter     No Known Problems Son        Vitals:  Vitals:    05/12/24 1334   BP: 110/60   Pulse: 68   Temp: 98.2 F (36.8 C)   TempSrc: Temporal   SpO2: 94%   Weight: 94.3 kg (207 lb 12.8 oz)   Height: 1.854 m (6' 1)            Subjective   REVIEW OF SYSTEMS:   Review of Systems   Constitutional: Negative.  Negative for chills, diaphoresis and fever.   HENT: Negative.  Negative for congestion,  ear pain, hearing loss, nosebleeds, sore throat and tinnitus.    Eyes: Negative.   Negative for pain, discharge and redness.   Respiratory: Negative.  Negative for cough, shortness of breath and wheezing.    Cardiovascular: Negative.  Negative for chest pain, palpitations and leg swelling.   Gastrointestinal: Negative.  Negative for abdominal pain, blood in stool, constipation, diarrhea, nausea and vomiting.   Endocrine: Negative for polydipsia.   Genitourinary:  Negative for dysuria, flank pain, frequency, hematuria and urgency.   Musculoskeletal: Negative.  Negative for back pain, myalgias and neck pain.   Skin: Negative.  Negative for rash.   Neurological: Negative.  Negative for dizziness, tremors, seizures, weakness and headaches.   Hematological:  Does not bruise/bleed easily.   Psychiatric/Behavioral: Negative.  The patient is not nervous/anxious.        Objective   PHYSICAL EXAM:  Physical Exam  Vitals reviewed.   Constitutional:       General: He is not in acute distress.     Appearance: He is well-developed.   HENT:      Head: Normocephalic and atraumatic.      Mouth/Throat:      Pharynx: Uvula midline.      Tonsils: No tonsillar exudate.   Eyes:      General: Lids are normal.      Conjunctiva/sclera: Conjunctivae normal.      Pupils: Pupils are equal, round, and reactive to light.   Neck:      Thyroid: No thyroid mass or thyromegaly.      Vascular: No JVD.      Trachea: Trachea normal. No tracheal deviation.   Cardiovascular:      Rate and Rhythm: Normal rate and regular rhythm.      Pulses: Normal pulses.      Heart sounds: Normal heart sounds.   Pulmonary:      Effort: Pulmonary effort is normal. No respiratory distress.      Breath sounds: Normal breath sounds. No wheezing or rales.   Chest:      Chest wall: No tenderness.   Abdominal:      General: Bowel sounds are normal. There is no distension.      Palpations: Abdomen is soft. There is no mass.      Tenderness: There is no abdominal tenderness. There is no guarding.   Musculoskeletal:         General: No tenderness or deformity.       Cervical back: Normal range of motion and neck supple.      Comments: Range of motion within normal limits x4 extremities   Skin:     General: Skin is warm.      Findings: No bruising, erythema or rash.   Neurological:      Mental Status: He is alert and oriented to person, place, and time.      Cranial Nerves: No cranial nerve deficit.      Coordination: Coordination normal.   Psychiatric:         Behavior: Behavior normal.         Thought Content: Thought content normal.         Labs reviewed today:  Lab Results   Component Value Date    WBC 7.21 05/12/2024    HGB 15.2 05/12/2024    HCT 45.7 05/12/2024    MCV 88.2 05/12/2024    PLT 289 05/12/2024     Lab Results   Component Value Date    NEUTROABS 2.80  05/12/2024     Lab Results   Component Value Date    NA 139 05/12/2024    K 4.6 05/12/2024    CL 103 05/12/2024    CO2 24 05/12/2024    BUN 13 05/12/2024    CREATININE 1.3 (H) 03/10/2024    GLUCOSE 98 05/12/2024    CALCIUM  9.5 05/12/2024    BILITOT 0.4 05/12/2024    ALKPHOS 81 05/12/2024    AST 17 05/12/2024    ALT 9 05/12/2024    LABGLOM 65 03/10/2024    GLOB 2.7 11/05/2022       ASSESSMENT/PLAN:      1. Unprovoked bilateral pulmonary emboli with DVT in the right lower extremity (gastrocnemius vein) March 2024, he has history of DVT on 2 prior occasions that were provoked.  Thrombophilia workup on 11/05/2022 was unrevealing for any underlying clotting deficiency.      Current recommendation is for long-term anticoagulation with Eliquis  5 mg p.o. twice daily.    Reports tolerating without difficulty.  Reports no issues with obtaining the medication.  Reports no bleeding, nosebleeds, blood in urine, or excessive bruising.     White count 7.21, hemoglobin 15.2, and platelets 289,000 today, 05/12/2024.    - Continue Eliquis  5 mg p.o. twice daily with no stop date  - Continue monitoring for any signs of bleeding or excessive bruising.  - If procedures or surgeries are needed, inform the provider for appropriate  adjustments to anticoagulation therapy, including the use of Lovenox.  -Encouraged wearing compression socks and elevating right lower extremity if swelling occurs.    2. Tobacco abuse: Currently smoking 8 to 10 cigarettes daily with a history of pack per day for the last 35 years: Was unsuccessful with Chantix , ineffective  We talked about the importance of quitting smoking for approximately 4-5 minutes. Specifically, we discussed the risk related tobacco including but not limited to carcinoma, cardiovascular disease, stroke, and financial loss. I advised to quit smoking and offered resources to include nicotine  patches to help with the craving. The patient is contemplated at this time. I will follow-up on smoking cessation during the next visit and continue to encourage quitting tobacco use.     Call the [facility] Tobacco Quit Line 1-800-QUIT-NOW - nicotine  gum, patches.  Consider class at [facility] a referral to smoking cessation    3.  Routine health screening:  Strongly recommend completing a Cologuard  PSA 0.38 on 02/15/2023    4. Bilateral pulmonary nodes measuring up to 0.5 cm with mild right hilar lymphadenopathy, suspected to be reactive, CTA of the chest 01/27/2023.  (CTA of the chest on 05/31/2023 reported 1 cm subpleural nodule in the left lower lobe) History of asthma and currently symptomatic with inspiratory wheezing with exertion.    12/30/2023 CT chest without contrast- reported stable bilateral lung nodules. No new lung nodules.     -Follow up with Dr. Lavonda on 07/13/2024 with CT chest on 07/07/2024      I discussed all of the above findings included in the assessment and plan with the patient and the patient is in agreement to move forward with current recommendations/treatment.  I have addressed all of their questions and concerns that were verbalized.    Medications were reviewed and updated, recommendations were made if appropriate.  Reviewed previous historical labs, imaging and progress  notes that were available from other specialties and PCP.  Labs and imaging ordered will be evaluated and recommendations will be made going forward  as well as any potential changes to treatment plan at that time will be communicated to the patient.         FOLLOW UP:  Follow-up appointment made for 1 year, or sooner, if needed history of bilateral pulmonary emboli and right lower extremity DVT and long-term anticoagulation management along with bilateral subcentimeter pulmonary nodules with lymphadenopathy  Continue to follow with other medical providers as recommended  Labs at next visit: CBC and CMP    EMR Dragon/Transcription disclaimer:   Much of this encounter note is an electronic transcription/translation of spoken language to printed text. The electronic translation of spoken language may permit erroneous, or at times, nonsensical words or phrases to be inadvertently transcribed; although attempts have made to review the note for such errors, some may still exist.  Please excuse any unrecognized transcription errors and contact us  if the error is unintelligible or needs documented correction.  Also, portions of this note have been copied forward, however, changed to reflect the most current clinical status of this patient.    The patient (or guardian, if applicable) and other individuals in attendance with the patient were advised that Artificial Intelligence will be utilized during this visit to record, process the conversation to generate a clinical note, and support improvement of the AI technology. The patient (or guardian, if applicable) and other individuals in attendance at the appointment consented to the use of AI, including the recording.      Electronically signed by Greig JONELLE Lee, APRN on 05/12/2024 at 1:55 PM  I, Grayce Piano, am starting this note as a designer, jewellery for Greig Lee, APRN.     "

## 2024-06-14 ENCOUNTER — Encounter: Payer: Self-pay | Admitting: Gastroenterology

## 2024-06-14 ENCOUNTER — Ambulatory Visit: Admitting: Gastroenterology

## 2024-06-14 ENCOUNTER — Other Ambulatory Visit

## 2024-06-14 ENCOUNTER — Ambulatory Visit: Payer: Self-pay | Admitting: Gastroenterology

## 2024-06-14 VITALS — BP 120/70 | HR 86 | Ht 67.5 in | Wt 194.0 lb

## 2024-06-14 DIAGNOSIS — C3490 Malignant neoplasm of unspecified part of unspecified bronchus or lung: Secondary | ICD-10-CM

## 2024-06-14 DIAGNOSIS — K760 Fatty (change of) liver, not elsewhere classified: Secondary | ICD-10-CM | POA: Diagnosis not present

## 2024-06-14 DIAGNOSIS — R1011 Right upper quadrant pain: Secondary | ICD-10-CM

## 2024-06-14 DIAGNOSIS — K219 Gastro-esophageal reflux disease without esophagitis: Secondary | ICD-10-CM

## 2024-06-14 DIAGNOSIS — R748 Abnormal levels of other serum enzymes: Secondary | ICD-10-CM

## 2024-06-14 DIAGNOSIS — Z1211 Encounter for screening for malignant neoplasm of colon: Secondary | ICD-10-CM

## 2024-06-14 LAB — HEPATIC FUNCTION PANEL
ALT: 35 U/L (ref 0–53)
AST: 27 U/L (ref 0–37)
Albumin: 4.5 g/dL (ref 3.5–5.2)
Alkaline Phosphatase: 92 U/L (ref 39–117)
Bilirubin, Direct: 0.1 mg/dL (ref 0.0–0.3)
Total Bilirubin: 0.5 mg/dL (ref 0.2–1.2)
Total Protein: 7.3 g/dL (ref 6.0–8.3)

## 2024-06-14 LAB — LIPASE: Lipase: 93 U/L — ABNORMAL HIGH (ref 11.0–59.0)

## 2024-06-14 MED ORDER — OMEPRAZOLE 40 MG PO CPDR
40.0000 mg | DELAYED_RELEASE_CAPSULE | Freq: Every day | ORAL | 1 refills | Status: AC
Start: 2024-06-14 — End: ?

## 2024-06-14 NOTE — Patient Instructions (Signed)
 You have been scheduled for an abdominal ultrasound at [facility] (1st floor of hospital) on Tuesday, 06-20-24 at 7:30am. Please arrive 15 minutes prior to your appointment for registration. Make certain not to have anything to eat or drink 6 hours prior to your appointment. Should you need to reschedule your appointment, please contact radiology at [phone]. This test typically takes about 30 minutes to perform.   Please go to the lab in the basement of our building to have lab work done as you leave today. Hit B for basement when you get on the elevator.  When the doors open the lab is on your left.  We will call you with the results. Thank you.  We have sent the following medications to your pharmacy for you to pick up at your convenience: Omeprazole 40 mg: Take once daily  You can contact [facility] at [phone] to arrange for someone to drive you to your procedure, wait, and drive you home. The cost is approximately $100 (includes 4 hours), plus mileage. (Please note: Rates are subject to change)  Please let us  know if you would like to get scheduled for the colonoscopy discussed at today's visit.   Thank you for entrusting me with your care and for choosing [facility], Dr. Elspeth Naval    _______________________________________________________  If your blood pressure at your visit was 140/90 or greater, please contact your primary care physician to follow up on this.  _______________________________________________________  If you are age 34 or older, your body mass index should be between 23-30. Your Body mass index is 29.94 kg/m. If this is out of the aforementioned range listed, please consider follow up with your Primary Care Provider.  If you are age 55 or younger, your body mass index should be between 19-25. Your Body mass index is 29.94 kg/m. If this is out of the aformentioned range listed, please consider follow up with  your Primary Care Provider.   ________________________________________________________  The [facility] providers would like to encourage you to use MYCHART to communicate with providers for non-urgent requests or questions.  Due to long hold times on the telephone, sending your provider a message by [facility] may be a faster and more efficient way to get a response.  Please allow 48 business hours for a response.  Please remember that this is for non-urgent requests.  _______________________________________________________  [facility] is using a team-based approach to care.  Your team is made up of your doctor and two to three APPS. Our APPS (Nurse Practitioners and Physician Assistants) work with your physician to ensure care continuity for you. They are fully qualified to address your health concerns and develop a treatment plan. They communicate directly with your gastroenterologist to care for you. Seeing the Advanced Practice Practitioners on your physician's team can help you by facilitating care more promptly, often allowing for earlier appointments, access to diagnostic testing, procedures, and other specialty referrals.

## 2024-06-14 NOTE — Progress Notes (Signed)
 HPI :  53 year old male with a history of testicular cancer, metastatic lung cancer, referred here for abdominal pain by Dr. Francee Induru.  This is his first time here.  We reviewed his medical history.  History of testicular cancer at age 51, s/p surgery and radiation.  In remission.  History of tobacco use, diagnosed with lung cancer in 2020 treated with chemotherapy and immunotherapy.  Has had a very good response to therapy and has stable disease at this point in time, followed up closely by oncology.  He is here today for symptoms of abdominal pain.  This has been ongoing for a few months.  Typically he can feel it in the right upper quadrant, sometimes can radiate to his lower abdomen.  He had a 2 to 3-week stretch where it was bothering him pretty bad but has not been as bad lately.  He states that his short lasting, usually minutes at a time and then goes away.  He can feel it multiple times per day.  Feels it as a pressure on his right side.  Does not really radiate to his back or shoulder.  No constipation, no diarrhea, no bowel changes.  Denies any dietary changes.  No trigger foods, does not think this is per prandial.  He states that his worst when it was really bothering him it was rated 9 out of 10, led him to an ED visit.  Currently states it is more recently been about 2 out of 10 and not nearly as bad.  Again he has no clear triggers and feels this is sporadic.  He does have history of reflux, typically not during the daytime but he does have nocturnal waterbrash and GERD patient that can bother him.  He had been taking Tums as needed and trying to avoid eating prior to bedtime.  He states he can have waves of nausea at times that is often mild [place].  He does not use any NSAIDs, occasional Tylenol  denies any blood in his stools.  He is currently on Tabrecta for his lung cancer, states he has been on it a few years and feels that he tolerates it pretty well.  As far as workup  goes he had a CT scan chest abdomen pelvis this past July.  He had hepatic steatosis noted but no other concerning pathology to account for his symptoms. He does not drink any alcohol, states he stopped smoking few years ago.  In regards to hepatic steatosis, his weight has been stable around 200 pounds.  He does do intermittent fasting.  He has not tried any antacids yet for his symptoms.  CBC I can see from July is normal, in the setting of pain during ED visit.   He has never had an EGD or colonoscopy.  Of note, he states he has no transportation to drive him to a procedure should he wish to pursue that at some point.  CT C/A/P 02/07/24 IMPRESSION: 1. No evidence of aortic aneurysm, dissection, or other acute aortic pathology. 2. Unchanged nonaneurysmal focal ectasia of the infrarenal abdominal aorta measuring 2.4 x 2.8 cm. 3. Unchanged irregular nodule of the lateral segment right middle lobe measuring 1.1 x 0.7 cm with adjacent scarring. This is consistent with treated primary lung malignancy. 4. Emphysema. 5. Coronary artery disease. 6. Hepatic steatosis.      Past Medical History:  Diagnosis Date   Aortic atherosclerosis    CAD (coronary artery disease), native coronary artery 07/01/2021   Emphysema of  lung (HCC)    Fatty liver    Lung cancer (HCC)    Stroke (HCC)    Testicle cancer [facility])      Past Surgical History:  Procedure Laterality Date   CARDIAC CATHETERIZATION     LEFT HEART CATH AND CORONARY ANGIOGRAPHY N/A 07/01/2021   Procedure: LEFT HEART CATH AND CORONARY ANGIOGRAPHY;  Surgeon: Ladona Heinz, MD;  Location: [facility] INVASIVE CV LAB;  Service: Cardiovascular;  Laterality: N/A;   TESTICLE SURGERY     Family History  Problem Relation Age of Onset   Heart disease Mother        pace maker   Diabetes Mother    Stroke Mother    Cancer Father        early in his throat   Colon cancer Neg Hx    Social History   Tobacco Use   Smoking status: Former    Current  packs/day: 0.25    Average packs/day: 0.3 packs/day for 40.0 years (10.0 ttl pk-yrs)    Types: Cigarettes   Smokeless tobacco: Never   Tobacco comments:    Given phone number to [place] quit line encouraged smoking cessation  Vaping Use   Vaping status: Every Day  Substance Use Topics   Alcohol use: No   Drug use: Not Currently   Current Outpatient Medications  Medication Sig Dispense Refill   atorvastatin  (LIPITOR ) 10 MG tablet Take 1 tablet (10 mg total) by mouth daily. 90 tablet 1   ezetimibe  (ZETIA ) 10 MG tablet Take 1 tablet (10 mg total) by mouth daily. 90 tablet 1   levothyroxine  (SYNTHROID ) 125 MCG tablet Take 125 mcg by mouth daily before breakfast.     nitroGLYCERIN  (NITROSTAT ) 0.4 MG SL tablet Place 1 tablet (0.4 mg total) under the tongue every 5 (five) minutes as needed for chest pain. 30 tablet 12   sildenafil  (VIAGRA ) 50 MG tablet Take 1 tablet (50 mg total) by mouth daily as needed for erectile dysfunction. 1/2 tab to 1 tab as needed daily 15 tablet 3   TABRECTA 200 MG tablet Take 400 mg by mouth in the morning and at bedtime.     BAYER LOW DOSE 81 MG chewable tablet Chew 81 mg by mouth daily. (Patient not taking: Reported on 06/14/2024)     Vitamin D , Ergocalciferol , (DRISDOL ) 1.25 MG (50000 UNIT) CAPS capsule Take 1 capsule (50,000 Units total) by mouth every 7 (seven) days. 10 capsule 0   No current facility-administered medications for this visit.   No Known Allergies   Review of Systems: All systems reviewed and negative except where noted in HPI.    No results found.  Physical Exam: BP 120/70   Pulse 86   Ht 5' 7.5 (1.715 m)   Wt 194 lb (88 kg)   BMI 29.94 kg/m  Constitutional: Pleasant,well-developed, male in no acute distress. HEENT: Normocephalic and atraumatic. Conjunctivae are normal. No scleral icterus. Neck supple.  Cardiovascular: Normal rate, regular rhythm.  Pulmonary/chest: Effort normal and breath sounds normal. . Abdominal: Soft,  nondistended, nontender. There are no masses palpable.  Extremities: no edema Lymphadenopathy: No cervical adenopathy noted. Neurological: Alert and oriented to person place and time. Skin: Skin is warm and dry. No rashes noted. Psychiatric: Normal mood and affect. Behavior is normal.   ASSESSMENT: 53 y.o. male here for assessment of the following  1. RUQ pain   2. Gastroesophageal reflux disease, unspecified whether esophagitis present   3. Hepatic steatosis   4. Malignant neoplasm of lung,  unspecified laterality, unspecified part of lung (HCC)   5. Colon cancer screening    History as outlined above.  Intermittent mostly right upper side abdominal pain ongoing for few months, was quite severe previously that led to ED visit, CT imaging without clear etiology.  Symptoms have improved somewhat in regards to severity but still bothersome periodically.  We discussed DDx.  He certainly has reflux, nocturnal symptoms mostly, has not been on PPI, esophagitis/gastritis PUD etc. is possible.  Biliary colic would be atypical for the symptoms he endorses, no prandial component, although possible.  The Tabrecta medication he takes can be associated with GI upset nausea etc., however has been on this for years, unclear why it would cause more problems now when he has tolerated it previously.  Discussed options to evaluate this.  I do not see recent LFTs and lipase so we will check that to make sure okay.  Ultimately I think the best test to do for this would be an EGD to evaluate his upper abdominal pain and reflux, and colonoscopy for colon cancer screening.  We discussed with these exams entail, risks and benefits of them and anesthesia.  His malignancy appears stable for years and doing really well in that regard.  He does want to proceed with these exams however is quite concerned about transportation for them.  I provided him contacted [facility] who offer services for transportation, he can see  how much that may be and if it something that he can do at this time or not.  It is required that he have transportation in order to both these exams.  He will look into that and if that is possible then he will let us  know to proceed with his exams.  In the interim we will start him on omeprazole 40 mg daily for 30-day trial to see if that changes his symptoms in any way.  Will also obtain a right upper quadrant ultrasound to screen for gallstones.  In addition, counseled him on hepatic steatosis, risk for fibrotic change and cirrhosis.  He is not drinking any alcohol.  Weight loss would benefit him over time, he will work on this and otherwise encouraged routine coffee intake which can help prevent fibrotic change, he drinks coffee and will continue to do so.  PLAN: - lab today - LFTs, lipase - RUQ US  - screen for gallstones - start omeprazole 40mg  / day for 30 day trial - continue it if it helps - recommended EGD and colonoscopy - he has no transportation currently to schedule it - will call [facility] for information. If he can coordinate a ride we will schedule, he can contact us  when he is ready to do so - counseled on fatty liver - will check LFTs, continue coffee intake, monitor weight  Marcey Naval, MD [facility]  CC: Mitzi Francee SAUNDERS, MD

## 2024-06-20 ENCOUNTER — Ambulatory Visit (HOSPITAL_COMMUNITY)
Admission: RE | Admit: 2024-06-20 | Discharge: 2024-06-20 | Disposition: A | Discharge status: Home / Self Care | Source: Ambulatory Visit | Attending: Gastroenterology | Admitting: Gastroenterology

## 2024-06-20 DIAGNOSIS — R1011 Right upper quadrant pain: Secondary | ICD-10-CM | POA: Insufficient documentation

## 2024-06-20 DIAGNOSIS — K760 Fatty (change of) liver, not elsewhere classified: Secondary | ICD-10-CM | POA: Insufficient documentation

## 2024-06-25 ENCOUNTER — Encounter

## 2024-06-26 MED ORDER — FAMOTIDINE 40 MG PO TABS
40 | ORAL_TABLET | Freq: Every evening | ORAL | 3 refills | 30.00000 days | Status: DC
Start: 2024-06-26 — End: 2024-07-07

## 2024-06-27 ENCOUNTER — Other Ambulatory Visit

## 2024-06-27 ENCOUNTER — Telehealth: Payer: Self-pay

## 2024-06-27 ENCOUNTER — Ambulatory Visit: Payer: Self-pay | Admitting: Gastroenterology

## 2024-06-27 DIAGNOSIS — R748 Abnormal levels of other serum enzymes: Secondary | ICD-10-CM

## 2024-06-27 LAB — LIPASE: Lipase: 40 U/L (ref 11.0–59.0)

## 2024-06-27 NOTE — Telephone Encounter (Signed)
MyChart message to patient to go to the lab 

## 2024-06-27 NOTE — Telephone Encounter (Signed)
-----   Message from [facility] sent at 06/15/2024  1:16 PM EST ----- Regarding: lipase Send patient a MyChart message reminding him to go to the lab this week for Lipase recheck.  Order is in

## 2024-07-03 ENCOUNTER — Other Ambulatory Visit (HOSPITAL_COMMUNITY): Payer: Self-pay | Admitting: Internal Medicine

## 2024-07-03 DIAGNOSIS — R1031 Right lower quadrant pain: Secondary | ICD-10-CM

## 2024-07-03 DIAGNOSIS — C3491 Malignant neoplasm of unspecified part of right bronchus or lung: Secondary | ICD-10-CM

## 2024-07-03 DIAGNOSIS — G629 Polyneuropathy, unspecified: Secondary | ICD-10-CM

## 2024-07-07 ENCOUNTER — Encounter

## 2024-07-07 ENCOUNTER — Ambulatory Visit: Payer: Medicaid (Managed Care) | Primary: Family Medicine

## 2024-07-07 MED ORDER — FAMOTIDINE 40 MG PO TABS
40 | ORAL_TABLET | Freq: Every evening | ORAL | 3 refills | 30.00000 days | Status: AC
Start: 2024-07-07 — End: 2025-07-02

## 2024-07-07 MED ORDER — ALBUTEROL SULFATE HFA 108 (90 BASE) MCG/ACT IN AERS
108 | Freq: Four times a day (QID) | RESPIRATORY_TRACT | 3 refills | 25.00000 days | Status: AC | PRN
Start: 2024-07-07 — End: 2024-09-12

## 2024-07-07 MED ORDER — APIXABAN 5 MG PO TABS
5 | ORAL_TABLET | Freq: Two times a day (BID) | ORAL | 1 refills | 30.00000 days | Status: AC
Start: 2024-07-07 — End: 2025-01-03

## 2024-07-07 MED ORDER — SIMVASTATIN 40 MG PO TABS
40 | ORAL_TABLET | Freq: Every evening | ORAL | 1 refills | 90.00000 days | Status: AC
Start: 2024-07-07 — End: 2025-01-03

## 2024-07-10 MED ORDER — TRELEGY ELLIPTA 100-62.5-25 MCG/ACT IN AEPB
100-62.5-25 | Freq: Every day | RESPIRATORY_TRACT | 11 refills | Status: AC
Start: 2024-07-10 — End: ?

## 2024-07-13 ENCOUNTER — Encounter: Payer: Medicaid (Managed Care) | Attending: Critical Care Medicine | Primary: Family Medicine

## 2024-07-17 ENCOUNTER — Encounter: Payer: Medicaid (Managed Care) | Attending: Critical Care Medicine | Primary: Family Medicine

## 2024-07-18 ENCOUNTER — Inpatient Hospital Stay: Admit: 2024-07-18 | Payer: Medicaid (Managed Care) | Primary: Family Medicine

## 2024-07-18 DIAGNOSIS — R918 Other nonspecific abnormal finding of lung field: Principal | ICD-10-CM

## 2024-07-25 ENCOUNTER — Encounter: Payer: Medicaid (Managed Care) | Attending: Critical Care Medicine | Primary: Family Medicine

## 2024-08-02 ENCOUNTER — Ambulatory Visit (HOSPITAL_COMMUNITY)
Admission: RE | Admit: 2024-08-02 | Discharge: 2024-08-02 | Disposition: A | Discharge status: Home / Self Care | Source: Ambulatory Visit | Attending: Internal Medicine | Admitting: Internal Medicine

## 2024-08-02 DIAGNOSIS — C3491 Malignant neoplasm of unspecified part of right bronchus or lung: Secondary | ICD-10-CM | POA: Insufficient documentation

## 2024-08-02 DIAGNOSIS — N200 Calculus of kidney: Secondary | ICD-10-CM | POA: Diagnosis not present

## 2024-08-02 DIAGNOSIS — C3431 Malignant neoplasm of lower lobe, right bronchus or lung: Secondary | ICD-10-CM | POA: Diagnosis not present

## 2024-08-02 DIAGNOSIS — G629 Polyneuropathy, unspecified: Secondary | ICD-10-CM | POA: Diagnosis present

## 2024-08-02 DIAGNOSIS — K760 Fatty (change of) liver, not elsewhere classified: Secondary | ICD-10-CM | POA: Diagnosis not present

## 2024-08-02 DIAGNOSIS — R1031 Right lower quadrant pain: Secondary | ICD-10-CM | POA: Insufficient documentation

## 2024-08-02 DIAGNOSIS — I700 Atherosclerosis of aorta: Secondary | ICD-10-CM | POA: Insufficient documentation

## 2024-08-02 DIAGNOSIS — I2510 Atherosclerotic heart disease of native coronary artery without angina pectoris: Secondary | ICD-10-CM | POA: Diagnosis not present

## 2024-08-02 LAB — GLUCOSE, CAPILLARY: Glucose-Capillary: 109 mg/dL — ABNORMAL HIGH (ref 70–99)

## 2024-08-02 MED ORDER — FLUDEOXYGLUCOSE F - 18 (FDG) INJECTION
9.6000 | Freq: Once | INTRAVENOUS | Status: AC | PRN
  Administered 2024-08-02: 9.6 via INTRAVENOUS

## 2024-08-15 ENCOUNTER — Ambulatory Visit
Admit: 2024-08-15 | Discharge: 2024-08-15 | Payer: Medicaid (Managed Care) | Attending: Critical Care Medicine | Primary: Family Medicine

## 2024-08-15 ENCOUNTER — Encounter

## 2024-08-15 VITALS — BP 140/88 | HR 90 | Temp 97.60000°F | Resp 20 | Ht 73.0 in | Wt 209.0 lb

## 2024-08-15 DIAGNOSIS — R918 Other nonspecific abnormal finding of lung field: Principal | ICD-10-CM

## 2024-08-15 LAB — CBC WITH AUTO DIFFERENTIAL
Basophils %: 0.7 % (ref 0.0–1.0)
Basophils Absolute: 0.1 K/uL (ref 0.00–0.20)
Eosinophils %: 5.6 % — ABNORMAL HIGH (ref 0.0–5.0)
Eosinophils Absolute: 0.4 K/uL (ref 0.00–0.60)
Hematocrit: 47.5 % (ref 42.0–52.0)
Hemoglobin: 16 g/dL (ref 14.0–18.0)
Immature Granulocytes #: 0 K/uL
Lymphocytes %: 31.6 % (ref 20.0–40.0)
Lymphocytes Absolute: 2.3 K/uL (ref 1.1–4.5)
MCH: 29.6 pg (ref 27.0–31.0)
MCHC: 33.7 g/dL (ref 33.0–37.0)
MCV: 87.8 fL (ref 80.0–94.0)
MPV: 9.1 fL — ABNORMAL LOW (ref 9.4–12.4)
Monocytes %: 10 % (ref 0.0–10.0)
Monocytes Absolute: 0.7 K/uL (ref 0.00–0.90)
Neutrophils %: 51.8 % (ref 50.0–65.0)
Neutrophils Absolute: 3.7 K/uL (ref 1.5–7.5)
Platelets: 322 K/uL (ref 130–400)
RBC: 5.41 M/uL (ref 4.70–6.10)
RDW: 13.1 % (ref 11.5–14.5)
WBC: 7.1 K/uL (ref 4.8–10.8)

## 2024-08-15 LAB — IGM: IgM: 54 mg/dL (ref 40–230)

## 2024-08-15 LAB — IGG: IgG: 900 mg/dL (ref 700–1600)

## 2024-08-15 MED ORDER — FLUTICASONE PROPIONATE 50 MCG/ACT NA SUSP
50 | Freq: Every day | NASAL | 5 refills | 60.00000 days | Status: AC
Start: 2024-08-15 — End: ?

## 2024-08-15 NOTE — Progress Notes (Signed)
 Pulmonary and Sleep Medicine    Danny Ruiz (DOB:  1970-10-05) is a 54 y.o. male,Established patient, here for evaluation of the following chief complaint(s):  Follow-up (6 month follow up- SOB./Chest CT completed on 07/18/2024./Last seen by Danny Dub, APRN on 01/06/2024./Pt has been experiencing congestion for the past several months. He states that he has been trying to treat with OTC medication, and has not relief. )      Referring physician:  No referring provider defined for this encounter.     ASSESSMENT/PLAN:  1. Lung nodules  2. Mild persistent asthma, unspecified whether complicated  3. Gastroesophageal reflux disease, unspecified whether esophagitis present  4. Cigarette nicotine  dependence without complication  -     CT LUNG CANCER SCREENING (INITIAL/ANNUAL); Future  5. Post-nasal drainage  -     fluticasone  (FLONASE) 50 MCG/ACT nasal spray; 2 sprays by Each Nostril route daily, Disp-16 g, R-5Normal  6. Other eosinophilia  -     CBC with Auto Differential; Future  -     IgE; Future  -     IgG; Future  -     IgM; Future        Continue the Trelegy inhaler.  The patient might benefit from nasal steroids.  We will start him on Flonase.  Reevaluate his eosinophilia.  Will also obtain IgE level.  Reevaluate in 4 weeks if he continues to have postnasal drainage we will consider Dupixent or Fasenra.       [name], MD, FCCP, DABSM    Return in about 4 weeks (around 09/12/2024).    SUBJECTIVE/OBJECTIVE:        Patient is here for follow-up on lung nodules.  He underwent CT of the chest that according to my interpretation showed no change in the previous lung nodules as far back as 2024.  He continues to smoke he is trying to quit.  He endorses postnasal drainage to the extent that he coughs and vomits.  He says this can happen unpredictably.  He does have eosinophilia on multiple CBCs done in the past.  He is currently on Trelegy and albuterol  inhalers.          Continue the following  medications as reported by the patient:    Prior to Visit Medications   Medication Sig Taking? Authorizing Provider   fluticasone -umeclidin-vilant (TRELEGY ELLIPTA ) 100-62.5-25 MCG/ACT AEPB inhaler Inhale 1 puff into the lungs daily Yes Bear, Adrianne, APRN - CNP   famotidine  (PEPCID ) 40 MG tablet Take 1 tablet by mouth every evening Yes Vannie Del, MD   apixaban  (ELIQUIS ) 5 MG TABS tablet Take 1 tablet by mouth 2 times daily Yes Vannie Del, MD   simvastatin  (ZOCOR ) 40 MG tablet Take 1 tablet by mouth nightly Yes Vannie Del, MD   albuterol  sulfate HFA (VENTOLIN  HFA) 108 (90 Base) MCG/ACT inhaler Inhale 2 puffs into the lungs every 6 hours as needed for Wheezing Yes Vannie Del, MD   ondansetron  (ZOFRAN -ODT) 4 MG disintegrating tablet Take 1 tablet by mouth 3 times daily as needed for Nausea or Vomiting Yes Vannie Del, MD   Glucosamine-Chondroitin (GLUCOSAMINE CHONDR COMPLEX PO) Take by mouth in the morning and at bedtime Yes [provider]   acetaminophen  (TYLENOL ) 325 MG tablet Take 2 tablets by mouth every 6 hours as needed for Pain Yes [provider]        Review of Systems   Constitutional:  Negative for activity change, appetite change, chills, diaphoresis and fatigue.  HENT:  Negative for congestion, dental problem, drooling, ear discharge, postnasal drip and rhinorrhea.    Eyes:  Negative for visual disturbance.   Respiratory:  Negative for apnea, cough, chest tightness, shortness of breath and wheezing.    Gastrointestinal:  Negative for abdominal distention, abdominal pain and anal bleeding.   Endocrine: Negative for cold intolerance, heat intolerance and polydipsia.   Genitourinary:  Negative for difficulty urinating, dysuria, enuresis and flank pain.   Musculoskeletal:  Negative for arthralgias, back pain and gait problem.   Allergic/Immunologic: Negative for environmental allergies.   Neurological:  Negative for dizziness, facial asymmetry, light-headedness and  headaches.       Vitals:    08/15/24 1427   BP: (!) 140/88   Pulse: 90   Resp: 20   Temp: 97.6 F (36.4 C)   SpO2: 100%   Weight: 94.8 kg (209 lb)   Height: 1.854 m (6' 1)      BMI Readings from Last 1 Encounters:   08/15/24 27.57 kg/m         Physical Exam  Vitals reviewed.   Constitutional:       Appearance: Normal appearance.   HENT:      Head: Normocephalic and atraumatic.      Nose: Nose normal.   Eyes:      Extraocular Movements: Extraocular movements intact.      Conjunctiva/sclera: Conjunctivae normal.   Cardiovascular:      Rate and Rhythm: Normal rate and regular rhythm.      Heart sounds: No murmur heard.     No friction rub.   Pulmonary:      Effort: Pulmonary effort is normal. No respiratory distress.      Breath sounds: Normal breath sounds. No stridor. No wheezing, rhonchi or rales.   Abdominal:      General: There is no distension.      Palpations: There is no mass.      Tenderness: There is no abdominal tenderness. There is no guarding or rebound.   Musculoskeletal:      Cervical back: Normal range of motion and neck supple.   Neurological:      Mental Status: He is alert and oriented to person, place, and time.             This note was generated using a voice recognition software. Errors in voice recognition may have occurred.      An electronic signature was used to authenticate this note.    --Roderic JONELLE Cartwright, MD

## 2024-08-18 LAB — IGE: IgE: 237 kU/L — ABNORMAL HIGH (ref <=214)

## 2024-09-14 ENCOUNTER — Encounter: Payer: Medicaid (Managed Care) | Attending: Critical Care Medicine | Primary: Family Medicine
